# Patient Record
Sex: Female | Born: 1975 | Race: White | Hispanic: Yes | Marital: Married | Smoking: Never smoker
Health system: Southern US, Community
[De-identification: ages and names within clinical notes are randomized; demographics above are authoritative.]

## PROBLEM LIST (undated history)

## (undated) DIAGNOSIS — E079 Disorder of thyroid, unspecified: Secondary | ICD-10-CM

## (undated) DIAGNOSIS — E039 Hypothyroidism, unspecified: Secondary | ICD-10-CM

## (undated) DIAGNOSIS — Z8489 Family history of other specified conditions: Secondary | ICD-10-CM

## (undated) DIAGNOSIS — R112 Nausea with vomiting, unspecified: Secondary | ICD-10-CM

## (undated) DIAGNOSIS — Z9889 Other specified postprocedural states: Secondary | ICD-10-CM

## (undated) DIAGNOSIS — F419 Anxiety disorder, unspecified: Secondary | ICD-10-CM

## (undated) DIAGNOSIS — I10 Essential (primary) hypertension: Secondary | ICD-10-CM

## (undated) DIAGNOSIS — E785 Hyperlipidemia, unspecified: Secondary | ICD-10-CM

## (undated) DIAGNOSIS — D649 Anemia, unspecified: Secondary | ICD-10-CM

## (undated) DIAGNOSIS — U071 COVID-19: Secondary | ICD-10-CM

## (undated) HISTORY — DX: Hyperlipidemia, unspecified: E78.5

## (undated) HISTORY — DX: Disorder of thyroid, unspecified: E07.9

## (undated) HISTORY — PX: WISDOM TOOTH EXTRACTION: SHX21

## (undated) HISTORY — DX: Essential (primary) hypertension: I10

---

## 1999-07-24 ENCOUNTER — Other Ambulatory Visit: Admission: RE | Admit: 1999-07-24 | Discharge: 1999-07-24 | Payer: Self-pay | Admitting: Obstetrics & Gynecology

## 2000-01-15 ENCOUNTER — Encounter (INDEPENDENT_AMBULATORY_CARE_PROVIDER_SITE_OTHER): Payer: Self-pay

## 2000-01-15 ENCOUNTER — Inpatient Hospital Stay (HOSPITAL_COMMUNITY): Admission: AD | Admit: 2000-01-15 | Discharge: 2000-01-19 | Payer: Self-pay | Admitting: Obstetrics & Gynecology

## 2011-04-16 NOTE — Op Note (Signed)
[facility]  Patient:    Teresa Michael, Teresa Michael                         MRN: [id number] Proc. Date: 01/16/00 Adm. Date:  [id number] Attending:  Antionette Char                           Operative Report  PREOPERATIVE DIAGNOSIS:       Arrest of descent.  POSTOPERATIVE DIAGNOSIS:      Arrest of descent.  PROCEDURE:                    Primary low transverse cesarean section.  SURGEON:                      Coral Ceo.  ASSISTANT:                    Valetta Fuller.  ANESTHESIA:                   Epidural.  ESTIMATED BLOOD LOSS:         800 cc  IV FLUIDS:                    2200 cc.  URINE OUTPUT:                 450 cc, clear.  Foley to gravity.  COMPLICATIONS:                None.  FINDINGS:                     Viable female at 45, Apgars of 8 at one minute nd 9 at five minutes, weight of 7 pounds 4 ounces, cord pH of 7.34, normal uterus,  ovaries, and fallopian tubes.  DESCRIPTION OF PROCEDURE:     The patient is brought to the operating room after satisfactory redosing of the epidural.  The abdomen was prepped and draped in the usual sterile fashion.  A Pfannenstiel skin incision was made with the scalpel hat deepened down to the fascia with the scalpel.  The fascia was moved to the midline and, the fascial incision was extended to the left and to the right with curved  Mayo scissors.  Superior and inferior fascial edges were taken off of the rectus muscle both blunt and sharp dissection.  The rectus muscle was divided in the midline both bluntly and sharply.  Peritoneum was entered, and the incision was  extended to the left and to the right digitally.  Bladder blade was positioned, and the vesicouterine fold of peritoneum was grasps with forceps and was incised and undermined the Metzenbaum scissors.  The incision was extended to the left and o the right with the Metzenbaum scissors.  The bladder flap was  bluntly developed, and the bladder blade was repositioned in front of the urinary bladder and placed well out of the operative field.  Uterus was entered transversely in the lower uterine segment with the scalpel.  Vertex was noted to be occiput posterior, and delivery of the vertex was completed with rotation of the occiput into the incision and the aid of fundal pressure from the assistant.  The infants mouth and nose as suctioned with a suction bulb.  Clear fluid was expelled at the incision entrance into the uterus.  The delivery was then completed  with the aid of fundal pressure from the assistant.  The infants mouth and nose was suctioned with a suction bulb, and the umbilical cord was clamped and cut, and the infant was handed out to the nursing staff.  Cord pH and cord blood were obtained, and the placenta was spontaneously expelled from the uterine cavity intact.  The uterus was sterilized, and the endometrial surface was thoroughly debrided with a dry lap sponge.  The  edges of the uterine incision were grasped with ring forceps, and the uterus was closed with a continuous interlocking suture of 0 Monocryl in two layers.  The first layer was a continuous interlocking suture of 0 Monocryl, and the second layer of closure was a continuous imbricating suture of 0 Monocryl.  Hemostasis was excellent.  The uterus was placed back in its normal anatomic position, and pelvic cavity is thoroughly irrigated with warm saline solution, and all clots were removed.  The abdomen was then closed as follows:  The fascia was closed with a  continuous suture of 0 PDS.  The subcutaneous was thoroughly irrigated with warm saline solutions, and all areas of subcutaneous bleeding were coagulated with the Bovie.  The skin was then approximated with surgical stainless steel staples.  sterile bandage was applied to the incision closure.  Surgical technician indicated that all needle,  sponge, and instrument counts were correct.  The patient was transported to the recovery room in satisfactory condition after tolerating the  procedure well. DD:  01/16/00 TD:  01/17/00 Job: [id number]

## 2011-04-16 NOTE — Discharge Summary (Signed)
[facility]  Patient:    Teresa Michael, Teresa Michael                         MRN: [id number] Adm. Date:  [id number] Disc. Date: [id number] Attending:  Antionette Char Dictator:   Reynold Bowen, M.D.                           Discharge Summary  CONSULTING PHYSICIANS:        None.  PROCEDURE:                    Low transverse cesarean section for failure of descent.  HISTORY OF PRESENT ILLNESS:   The patient is a 35 year old, gravida 1, para 0, ho was admitted at 38-5/7 weeks for spontaneous rupture of membranes and irregular  contractions.  The patient was found to be in early active labor.  HOSPITAL COURSE:              The patient was noted to have irregular uterine contractions without changing her cervix.  The patient was started on low dose Pitocin for labor augmentation.  The patient had IUPC placed to verify the strength of her uterine contractions.  She was found to be in inadequate labor initially. Her Pitocin was increased.  Her uterine contractions were adequate.  The patient developed a fever secondary to prolonged rupture of membranes.  She was started on ampicillin for chorioamnionitis.  The patient began pushing with her uterine contractions and progressed to complete dilatation, 100% effacement, and +1 to 2 station.  However, the patient was not able to cause further descent of her fetus after two hours of pushing.  The patient was taken to the operating room by Leonette Most A. Clearance Coots, M.D. for low transverse cesarean section.  She was found to have a viable female that was delivered by cesarean section at 10 on January 16, 2000. Apgars were 8 and 9 at one and five minutes, respectively.  Birth weight was 7 pounds 4 ounces.  Cord pH was 7.34.  Postpartum, the patient was continued on Unasyn for 48 hours.  On the day of discharge, she had been afebrile for 48 hours and she had been off her IV antibiotics for one day.  The patient  was requesting Micronor for birth control.  She was breastfeeding her infant.  She was found to have significant anemia postoperatively with a hemoglobin of 6.1 and hematocrit of 18.9.  She was started on ferrous sulfate and Colace.  The patient had stable vital signs and was ready to go home.  DISPOSITION:                  The patient will be discharged to home on January 19, 2000.  She has an appointment to see her physician at [facility] on March 29, at 10 a.m.  DISCHARGE MEDICATIONS:        1. Lortab 5/500 one or two tablets every six hours                                  p.r.n. pain.                               2. Micronor birth control pills to be started this  Sunday and to be taken one tablet daily.                               3. Ferrous sulfate 325 mg tablets b.i.d. for one                                  month.                               4. Colace b.i.d. for one month.                               5. Prenatal vitamins until finished with her                                  current supply.  DISCHARGE DIAGNOSES:          1. Intrauterine pregnancy at 38-5/7 weeks, delivered.                               2. Failure of descent with need for low transverse                                  cesarean section.                               3. Viable term female.                               4. Premature rupture of membranes with                                  chorioamnionitis.                               5. Postoperative and postpartum anemia. DD:  01/19/00 TD:  01/19/00 Job: [id number]

## 2011-04-29 ENCOUNTER — Encounter (HOSPITAL_COMMUNITY): Payer: Self-pay | Attending: Obstetrics & Gynecology

## 2011-04-29 ENCOUNTER — Other Ambulatory Visit: Payer: Self-pay | Admitting: Obstetrics & Gynecology

## 2011-04-29 DIAGNOSIS — Z01818 Encounter for other preprocedural examination: Secondary | ICD-10-CM | POA: Insufficient documentation

## 2011-04-29 LAB — BASIC METABOLIC PANEL
Chloride: 104 mEq/L (ref 96–112)
GFR calc non Af Amer: >60 mL/min (ref >60)
Glucose, Bld: 75 mg/dL (ref 70–99)
Potassium: 3.8 mEq/L (ref 3.5–5.1)
Sodium: 135 mEq/L (ref 135–145)

## 2011-04-29 LAB — CBC
HCT: 36.7 % (ref 36.0–46.0)
Platelets: 200 10*3/uL (ref 150–400)
RBC: 4.86 MIL/uL (ref 3.87–5.11)
RDW: 16.3 % — ABNORMAL HIGH (ref 11.5–15.5)
WBC: 8.1 10*3/uL (ref 4.0–10.5)

## 2011-04-29 LAB — RPR: RPR Ser Ql: NONREACTIVE

## 2011-04-29 LAB — SURGICAL PCR SCREEN: Staphylococcus aureus: NEGATIVE

## 2011-05-03 ENCOUNTER — Other Ambulatory Visit: Payer: Self-pay | Admitting: Obstetrics & Gynecology

## 2011-05-03 ENCOUNTER — Inpatient Hospital Stay (HOSPITAL_COMMUNITY)
Admission: EM | Admit: 2011-05-03 | Discharge: 2011-05-06 | DRG: 766 | Disposition: A | Discharge status: Home / Self Care | Payer: Medicaid Other | Source: Ambulatory Visit | Attending: Obstetrics & Gynecology | Admitting: Obstetrics & Gynecology

## 2011-05-03 DIAGNOSIS — O34219 Maternal care for unspecified type scar from previous cesarean delivery: Principal | ICD-10-CM | POA: Diagnosis present

## 2011-05-03 DIAGNOSIS — O99814 Abnormal glucose complicating childbirth: Secondary | ICD-10-CM | POA: Diagnosis present

## 2011-05-03 DIAGNOSIS — O09529 Supervision of elderly multigravida, unspecified trimester: Secondary | ICD-10-CM | POA: Diagnosis present

## 2011-05-03 LAB — TYPE AND SCREEN
ABO/RH(D): O POS
Antibody Screen: NEGATIVE

## 2011-05-04 LAB — CBC
HCT: 29.3 % — ABNORMAL LOW (ref 36.0–46.0)
Hemoglobin: 8.9 g/dL — ABNORMAL LOW (ref 12.0–15.0)
WBC: 10.4 10*3/uL (ref 4.0–10.5)

## 2011-05-09 ENCOUNTER — Inpatient Hospital Stay (HOSPITAL_COMMUNITY): Admission: AD | Admit: 2011-05-09 | Payer: Self-pay | Source: Home / Self Care | Admitting: Obstetrics & Gynecology

## 2011-05-10 LAB — GLUCOSE, CAPILLARY

## 2011-06-03 NOTE — Discharge Summary (Signed)
  NAMEDULSE, RUTAN                ACCOUNT NO.:  [id number]  MEDICAL RECORD NO.:  [id number]  LOCATION:  9103                          FACILITY:  [facility]  PHYSICIAN:  Roseanna Rainbow, M.D.DATE OF BIRTH:  [birth year]  DATE OF ADMISSION:  05/03/2011 DATE OF DISCHARGE:  05/06/2011                              DISCHARGE SUMMARY   CHIEF COMPLAINT:  The patient is a 35 year old para 1 with an EDC of May 09, 2011, with an intrauterine pregnancy of 39 plus weeks for an elective repeat cesarean delivery.  ALLERGIES:  No known drug allergies.  MEDICATIONS:  DiaBeta and prenatal vitamins.  PAST OB HISTORY:  In 2001, she delivered of a live born female, full term, birth weight 7 pounds 6 ounces via cesarean delivery.  PAST MEDICAL HISTORY:  She denies.  PAST GYN HISTORY:  She denies.  FAMILY HISTORY:  Remarkable for chronic alcoholism, depression, hypertension, hypercholesterolemia.  SOCIAL HISTORY:  She is a smoker, married with minimal alcohol use.  No tobacco.  PAST SURGICAL HISTORY:  Please see the above oral surgery.  OB RISK FACTORS:  Gestational diabetes and no event rate controlled with history of repeat previous cesarean delivery.  PHYSICAL EXAMINATION:  VITAL SIGNS:  Blood pressure is 123/74. GENERAL:  No apparent distress. HEAD, EYES, EARS, NOSE, AND THROAT:  Normocephalic, atraumatic. NECK:  Supple. LUNGS:  Clear to auscultation bilaterally. HEART:  Regular rate and rhythm. ABDOMEN:  Gravid. PELVIC:  Deferred. EXTREMITIES:  Trace edema.  PRENATAL LABS:  A 2-hour GTT abnormal, GBS negative.  GC negative. Hepatitis B surface antigen negative.  Hemoglobin 10.9, hematocrit 35.5, platelets 251,000.  HIV nonreactive.  Pap negative.  Blood type O positive.  Antibody screen negative.  Rubella immune. Sickle cell negative.  Urine culture and sensitivity, no growth.  RPR nonreactive. Ultrasound on May 29, 2011, 21 weeks 5 days.  [facility] May 02, 2011,  no previa.  ASSESSMENT:  Primipara at 39 plus weeks, gestational diabetes on an oral agent, AGA fetus,  history of previous cesarean delivery, declined TOLAC.  PLAN:  Admission repeat cesarean delivery.  The risks, benefits were reviewed with the patient's questions were answered to the patient's stated satisfaction.  HOSPITAL COURSE:  The patient was admitted and underwent a repeat cesarean delivery, please see the dictated operative summary for findings.  On postoperative day #1, hemoglobin was 8.9.  The patient was hemodynamically stable.  The remainder of the patient's hospital course was uneventful.  She was discharged to home on postoperative day #3.  DISCHARGE DIAGNOSES: 1. Intrauterine pregnancy at term. 2. Gestational diabetes, on oral agent. 3. Anemia, iron deficiency.  PROCEDURE:  Repeat cesarean delivery.  CONDITION:  Stable.  DIET:  Regular.  ACTIVITY:  Pelvic rest, progressive activity.  MEDICATIONS:  Percocet 1-2 tablets every 6 hours as needed.  DISPOSITION:  The patient is to follow up in the office in 3 weeks.     Roseanna Rainbow, M.D.     [name]  D:  05/24/2011  T:  05/24/2011  Job:  [id number]  Electronically Signed by Antionette Char M.D. on 06/03/2011 10:27:13 PM

## 2011-06-04 NOTE — Discharge Summary (Signed)
Teresa Michael, SUNDQUIST NO.:  [id number]  MEDICAL RECORD NO.:  [id number]  LOCATION:  9103                          FACILITY:  [facility]  PHYSICIAN:  Roseanna Rainbow, M.D.DATE OF BIRTH:  [birth year]  DATE OF ADMISSION:  05/03/2011 DATE OF DISCHARGE:  05/06/2011                              DISCHARGE SUMMARY   CHIEF COMPLAINT:  The patient is a 35 year old para 1 with an estimated date of confinement of June 10 with an intrauterine pregnancy at 24 plus weeks who presents for an elective repeat cesarean delivery.  HISTORY OF PRESENT ILLNESS:  Please see the above.  ALLERGIES:  No known drug allergies.  MEDICATION:  Diabeta, prenatal vitamins.  PAST OB HISTORY:  In February 2001, she was delivered at term 7 pounds 6 ounce female via cesarean delivery.  PAST MEDICAL HISTORY:  She denies.  PAST GYN HISTORY:  She denies.  FAMILY HISTORY:  Remarkable for chronic alcoholism, depression, hypertension, and hypercholesterolemia.  PAST SURGICAL HISTORY:  Please see the above oral surgery.  SOCIAL HISTORY:  She is a Futures trader, married.  Formally minimal alcohol user.  She denies any tobacco or substance abuse.  OB RISK FACTORS:  Gestational diabetes on oral agent, advanced maternal age, history of previous cesarean delivery.  PRENATAL LABS:  A 2-hour GTT abnormal, GBS negative, GC negative, hepatitis B surface antigen negative, hemoglobin 10.9, hematocrit 35.5, platelets 251,000, HIV nonreactive.  Pap smear negative.  Blood type O+, antibody screen negative, rubella immune, sickle cell negative.  Urine culture and sensitivity, no growth.  RPR nonreactive.  Ultrasound on June 3 at 21 weeks 5 days, [facility] June 3, no previa.  PHYSICAL EXAMINATION:  VITAL SIGNS:  Stable, afebrile, blood pressure 123/74. GENERAL APPEARANCE:  A well developed, well nourished, no apparent distress. NECK:  Supple. LUNGS:  Clear to auscultation bilaterally. HEART:  Regular rate and  rhythm. ABDOMEN:  Gravid. PELVIC:  Deferred. EXTREMITIES:  Trace lower extremity edema, nontender.  ASSESSMENT:  Primipara at 39 plus weeks, gestational diabetes, oral agent, AGA fetus, history of previous cesarean delivery, declines trial of labor after cesarean.  PLAN:  Admission, repeat cesarean delivery.  Risks, benefits were reviewed with the patient of this management plan.  Questions were answered and is stated satisfactory.  HOSPITAL COURSE:  The patient was admitted.  She underwent a repeat cesarean delivery.  Please see the dictated operative summary.  On postoperative day #1, hemoglobin was to be 49.  The patient is hemodynamically stable.  Remainder of her hospital course is uneventful and she was discharged to home on postoperative day #3.  DISCHARGE DIAGNOSES:  Intrauterine pregnancy at 1 plus weeks, pregnancy complicated by gestational diabetes on an oral agent, history of previous cesarean delivery, declines trial of labor after cesarean. Anemia secondary to blood loss.  PROCEDURE:  Repeat cesarean delivery.  CONDITION:  Stable.  DIET:  Regular.  ACTIVITY:  Pelvic rest, progressive activity.  MEDICATIONS: 1. Percocet 5/325, 1-2 tablets every 6 hours as needed. 2. Over-the-counter ibuprofen as needed. 3. Continue prenatal vitamins. 4. Add over-the-counter ferrous sulfate 325 mg 1 tablet daily. 5. Over-the-counter stool softener as needed.  DISPOSITION:  The patient is to follow up  in the office in 2 weeks.     Roseanna Rainbow, M.D.     Teresa Michael  D:  06/03/2011  T:  06/04/2011  Job:  [id number]  Electronically Signed by Antionette Char M.D. on 06/04/2011 03:38:11 PM

## 2011-06-15 NOTE — Op Note (Signed)
NAME:  Teresa Michael, Teresa Michael NO.:  [id number]  MEDICAL RECORD NO.:  [id number]  LOCATION:                                FACILITY:  [facility]  PHYSICIAN:  Roseanna Rainbow, M.D. DATE OF BIRTH:  DATE OF PROCEDURE: DATE OF DISCHARGE:                              OPERATIVE REPORT   PREOPERATIVE DIAGNOSES: 1. Intrauterine pregnancy at 39 plus weeks. 2. Gestational diabetes, on oral agents. 3. History of a previous cesarean delivery. 4. Declines a TOLAC.  POSTOPERATIVE DIAGNOSES: 1. Intrauterine pregnancy at 39 plus weeks. 2. Gestational diabetes, on oral agents. 3. History of a previous cesarean delivery. 4. Declines a TOLAC.  PROCEDURE:  Repeat low uterine flap elliptical cesarean delivery via transverse incision.  SURGEON:  Roseanna Rainbow, MD  ANESTHESIA:  Spinal.  ESTIMATED BLOOD LOSS:  600 mL.  COMPLICATIONS:  None.  IV FLUIDS AND URINE OUTPUT:  As per Anesthesiology.  FINDINGS:  Live born female, cephalic presentation and fairly dense adhesions involving the omentum to the parietal peritoneum of the inferior abdominal wall.  PROCEDURE:  The patient was taken to the operating room with an IV running.  Spinal anesthetic was administered.  She was then placed in the dorsal supine position with a leftward tilt and prepped and draped in the usual sterile fashion.  After time-out had been completed.  The previous scar was then incised with a scalpel.  This incision was then extended down to the fascia with the Bovie.  The fascia was nicked in the midline.  The fascial incision was then extended bilaterally.  The superior aspect of the fascial incision was tented up and the underlying rectus muscles dissected off.  The inferior aspect of the fascial incision was manipulated in a similar fashion.  The rectus muscles were separated in the midline.  Parietal peritoneum was tented up and entered sharply.  This incision was then extended superiorly and  inferiorly with good visualization of the bladder.  The above-noted omental adhesions were divided with the Bovie.  The bladder blade was then placed.  The vesicouterine peritoneum was tented up and entered.  This incision was then extended bilaterally.  The bladder flap was then created using sharp dissection.  The bladder blade was then replaced.  The lower uterine segment was incised in a transverse fashion with a scalpel. This incision was then extended bluntly.  The infant's head was delivered atraumatically.  The cord was clamped and cut.  The infant was handed off to the awaiting neonatologist.  The placenta was then removed.  The intrauterine cavity was evacuated of any remaining amniotic fluid clots and debris with a moistened laparotomy sponge. Uterine incision was then reapproximated in a running interlocking fashion using a suture of O Monocryl.  A second imbricating suture of the same was then placed.  Adequate hemostasis was noted.  The paracolic gutters were then irrigated.  The parietal peritoneum was reapproximated in a running fashion using a suture of 2-0 Vicryl.  Fascia was reapproximated with 2 running sutures of 0 PDS tied in the midline.  The skin was closed in a subcuticular fashion using a suture of 3-0 Monocryl.  At the closure of  the procedure, the instrument and pack counts were said to be correct x2.  The patient was taken to the PACU awake and in stable condition.     Roseanna Rainbow, M.D.     Teresa Michael  D:  05/04/2011  T:  05/04/2011  Job:  [id number]  Electronically Signed by Antionette Char M.D. on 06/03/2011 10:18:30 PM

## 2011-11-20 ENCOUNTER — Ambulatory Visit: Payer: Self-pay

## 2011-11-20 DIAGNOSIS — R509 Fever, unspecified: Secondary | ICD-10-CM

## 2011-11-20 DIAGNOSIS — J029 Acute pharyngitis, unspecified: Secondary | ICD-10-CM

## 2012-01-08 ENCOUNTER — Ambulatory Visit: Payer: Self-pay | Admitting: Family Medicine

## 2012-01-08 VITALS — BP 128/81 | HR 82 | Temp 98.0°F | Resp 16 | Ht 59.5 in | Wt 121.0 lb

## 2012-01-08 DIAGNOSIS — J329 Chronic sinusitis, unspecified: Secondary | ICD-10-CM

## 2012-01-08 DIAGNOSIS — J0100 Acute maxillary sinusitis, unspecified: Secondary | ICD-10-CM

## 2012-01-08 MED ORDER — AMOXICILLIN 875 MG PO TABS: 875.0000 mg | ORAL_TABLET | Freq: Two times a day (BID) | ORAL | Status: AC

## 2012-01-08 MED ORDER — FLUTICASONE PROPIONATE 50 MCG/ACT NA SUSP: 2.0000 | Freq: Every day | NASAL | Status: DC

## 2012-01-08 NOTE — Progress Notes (Signed)
  Subjective:    Patient ID: Teresa Michael, female    DOB: 12-21-1975, 36 y.o.   MRN: [id number]  HPI Thursday developed ST, headache and runny nose. Now with maxillary facial tenderness and otalgia.  Son sick   Review of Systems  Constitutional: Negative for fever and chills.  HENT: Positive for rhinorrhea (yellow nasal discharge).   Respiratory: Positive for cough (occasional).   Neurological: Positive for headaches.       Objective:   Physical Exam  Constitutional: She appears well-developed and well-nourished.  HENT:  Head: Normocephalic and atraumatic.  Right Ear: Tympanic membrane is retracted.  Left Ear: Tympanic membrane is retracted.  Nose: Rhinorrhea present.  Mouth/Throat: Posterior oropharyngeal edema: post nasal drainage.  Neck: Neck supple.  Cardiovascular: Normal rate, regular rhythm and normal heart sounds.   Pulmonary/Chest: Effort normal and breath sounds normal.  Lymphadenopathy:    She has cervical adenopathy (shoddy anterior ).  Neurological: She is alert.  Skin: Skin is warm.          Assessment & Plan:   1. Sinusitis  amoxicillin (AMOXIL) 875 MG tablet, fluticasone (FLONASE) 50 MCG/ACT nasal spray   Anticipatory guidance

## 2012-01-09 ENCOUNTER — Encounter: Payer: Self-pay | Admitting: Family Medicine

## 2012-09-30 ENCOUNTER — Ambulatory Visit: Payer: Self-pay | Admitting: Family Medicine

## 2012-09-30 VITALS — BP 135/78 | HR 77 | Temp 98.5°F | Resp 18 | Wt 117.0 lb

## 2012-09-30 DIAGNOSIS — K644 Residual hemorrhoidal skin tags: Secondary | ICD-10-CM

## 2012-09-30 DIAGNOSIS — R5381 Other malaise: Secondary | ICD-10-CM

## 2012-09-30 DIAGNOSIS — R42 Dizziness and giddiness: Secondary | ICD-10-CM

## 2012-09-30 DIAGNOSIS — R002 Palpitations: Secondary | ICD-10-CM

## 2012-09-30 DIAGNOSIS — R5383 Other fatigue: Secondary | ICD-10-CM

## 2012-09-30 LAB — POCT CBC
Lymph, poc: 1.9 (ref 0.6–3.4)
MCH, POC: 21 pg — AB (ref 27–31.2)
MCHC: 29.6 g/dL — AB (ref 31.8–35.4)
MCV: 70.9 fL — AB (ref 80–97)
MID (cbc): 0.6 (ref 0–0.9)
POC LYMPH PERCENT: 29.6 %L (ref 10–50)
Platelet Count, POC: 270 10*3/uL (ref 142–424)
RDW, POC: 16.8 %
WBC: 6.4 10*3/uL (ref 4.6–10.2)

## 2012-09-30 MED ORDER — HYDROCORTISONE 2.5 % RE CREA: TOPICAL_CREAM | Freq: Two times a day (BID) | RECTAL | Status: DC

## 2012-09-30 NOTE — Progress Notes (Signed)
Subjective:    Patient ID: Teresa Michael, female    DOB: Feb 19, 1976, 36 y.o.   MRN: [id number]  HPI Teresa Michael is a 36 y.o. female  Dizzy feeling since few weeks ago - shaky and weak at times - past few weeks - comes and goes.  No fever, no N/V, no abd pain.  No known hx of anemia. No dark or tarry stools. No chest pain, but palpitations at times - few times last week with stress at work.   Feels more nervous when shaky or weak.  Eating and drinking ok.  2-3 cups of coffee per day. Constipated last week. Sore at anus with BM's feels swollen in this area, but no bleeding.  Constipation improved 2 pr 3 days ago - moving bowels better. Still sore in area with BM's.     Gestational DM when pregnant about 16 months ago.. No recent blood work.  Urinating normally.  Sometimes more thirsty. No vision changes.   Review of Systems  Constitutional: Positive for fatigue. Negative for fever and chills.  Respiratory: Negative for chest tightness and shortness of breath.   Cardiovascular: Positive for palpitations. Negative for chest pain.  Neurological: Positive for dizziness. Negative for speech difficulty and weakness.  Psychiatric/Behavioral: The patient is nervous/anxious (occasional when fatigue spell. ).        Objective:   Physical Exam  Constitutional: She is oriented to person, place, and time. She appears well-developed and well-nourished.  HENT:  Head: Normocephalic and atraumatic.  Mouth/Throat: Oropharynx is clear and moist. No oropharyngeal exudate.  Eyes: Conjunctivae normal and EOM are normal. Pupils are equal, round, and reactive to light.  Neck: Carotid bruit is not present.  Cardiovascular: Normal rate, regular rhythm, normal heart sounds and intact distal pulses.   Pulmonary/Chest: Effort normal and breath sounds normal.  Abdominal: Soft. She exhibits no pulsatile midline mass. There is no tenderness.  Genitourinary:     Neurological: She is alert and oriented to person,  place, and time. She has normal strength. No sensory deficit.       Nonfocal.   Skin: Skin is warm and dry.  Psychiatric: She has a normal mood and affect. Her behavior is normal.   Results for orders placed in visit on 09/30/12  POCT CBC      Component Value Range   WBC 6.4  4.6 - 10.2 K/uL   Lymph, poc 1.9  0.6 - 3.4   POC LYMPH PERCENT 29.6  10 - 50 %L   MID (cbc) 0.6  0 - 0.9   POC MID % 9.8  0 - 12 %M   POC Granulocyte 3.9  2 - 6.9   Granulocyte percent 60.6  37 - 80 %G   RBC 6.00 (*) 4.04 - 5.48 M/uL   Hemoglobin 12.6  12.2 - 16.2 g/dL   HCT, POC 08.6  57.8 - 47.9 %   MCV 70.9 (*) 80 - 97 fL   MCH, POC 21.0 (*) 27 - 31.2 pg   MCHC 29.6 (*) 31.8 - 35.4 g/dL   RDW, POC 46.9     Platelet Count, POC 270  142 - 424 K/uL   MPV 9.7  0 - 99.8 fL  GLUCOSE, POCT (MANUAL RESULT ENTRY)      Component Value Range   POC Glucose 96  70 - 99 mg/dl          Assessment & Plan:  [name] is a 36 y.o. female 1. Dizziness  POCT CBC, POCT  glucose (manual entry), Basic metabolic panel, TSH  2. Fatigue  POCT CBC, POCT glucose (manual entry), Basic metabolic panel, TSH  3. Heart palpitations  POCT CBC, POCT glucose (manual entry), Basic metabolic panel, TSH  4. External hemorrhoid  hydrocortisone (ANUSOL-HC) 2.5 % rectal cream    Episodic malaise/fatigue, dizziness.  Suspect psychogenic/stress component and with intermittent palpitations - caffeine contributor. Reassuring CBC and glucose. Discussed less caffeine, more water, check TSH, lytes, stress mgt/relaxation techniques and rtc precautions.  Ext hemorrhoid - non thrombosed at this point. rtc precautions  With regards to s/sx of thrombosed hemorrhoid - recheck if not improving with anusol HC , and again encouraged increased water, fiber, and colace if needed.  Patient Instructions  Your should receive a call or letter about your lab results within the next week to 10 days. Increase your water and fiber intake.  Recheck in the next  week if not improving - Return to the clinic or go to the nearest emergency room if any of your symptoms worsen or new symptoms occur. Hemorrhoids Hemorrhoids are enlarged (dilated) veins around the rectum. There are 2 types of hemorrhoids, and the type of hemorrhoid is determined by its location. Internal hemorrhoids occur in the veins just inside the rectum.They are usually not painful, but they may bleed.However, they may poke through to the outside and become irritated and painful. External hemorrhoids involve the veins outside the anus and can be felt as a painful swelling or hard lump near the anus.They are often itchy and may crack and bleed. Sometimes clots will form in the veins. This makes them swollen and painful. These are called thrombosed hemorrhoids. CAUSES Causes of hemorrhoids include:  Pregnancy. This increases the pressure in the hemorrhoidal veins.  Constipation.  Straining to have a bowel movement.  Obesity.  Heavy lifting or other activity that caused you to strain. TREATMENT Most of the time hemorrhoids improve in 1 to 2 weeks. However, if symptoms do not seem to be getting better or if you have a lot of rectal bleeding, your caregiver may perform a procedure to help make the hemorrhoids get smaller or remove them completely.Possible treatments include:  Rubber band ligation. A rubber band is placed at the base of the hemorrhoid to cut off the circulation.  Sclerotherapy. A chemical is injected to shrink the hemorrhoid.  Infrared light therapy. Tools are used to burn the hemorrhoid.  Hemorrhoidectomy. This is surgical removal of the hemorrhoid. HOME CARE INSTRUCTIONS   Increase fiber in your diet. Ask your caregiver about using fiber supplements.  Drink enough water and fluids to keep your urine clear or pale yellow.  Exercise regularly.  Go to the bathroom when you have the urge to have a bowel movement. Do not wait.  Avoid straining to have bowel  movements.  Keep the anal area dry and clean.  Only take over-the-counter or prescription medicines for pain, discomfort, or fever as directed by your caregiver. If your hemorrhoids are thrombosed:  Take warm sitz baths for 20 to 30 minutes, 3 to 4 times per day.  If the hemorrhoids are very tender and swollen, place ice packs on the area as tolerated. Using ice packs between sitz baths may be helpful. Fill a plastic bag with ice. Place a towel between the bag of ice and your skin.  Medicated creams and suppositories may be used or applied as directed.  Do not use a donut-shaped pillow or sit on the toilet for long periods. This increases blood pooling  and pain. SEEK MEDICAL CARE IF:   You have increasing pain and swelling that is not controlled with your medicine.  You have uncontrolled bleeding.  You have difficulty or you are unable to have a bowel movement.  You have pain or inflammation outside the area of the hemorrhoids.  You have chills or an oral temperature above 102 F (38.9 C). MAKE SURE YOU:   Understand these instructions.  Will watch your condition.  Will get help right away if you are not doing well or get worse. Document Released: 11/12/2000 Document Revised: 02/07/2012 Document Reviewed: 10/26/2010 [facility] Patient Information 2013 [place], [place].

## 2012-09-30 NOTE — Patient Instructions (Signed)
Your should receive a call or letter about your lab results within the next week to 10 days. Increase your water and fiber intake.  Recheck in the next week if not improving - Return to the clinic or go to the nearest emergency room if any of your symptoms worsen or new symptoms occur. Hemorrhoids Hemorrhoids are enlarged (dilated) veins around the rectum. There are 2 types of hemorrhoids, and the type of hemorrhoid is determined by its location. Internal hemorrhoids occur in the veins just inside the rectum.They are usually not painful, but they may bleed.However, they may poke through to the outside and become irritated and painful. External hemorrhoids involve the veins outside the anus and can be felt as a painful swelling or hard lump near the anus.They are often itchy and may crack and bleed. Sometimes clots will form in the veins. This makes them swollen and painful. These are called thrombosed hemorrhoids. CAUSES Causes of hemorrhoids include:  Pregnancy. This increases the pressure in the hemorrhoidal veins.  Constipation.  Straining to have a bowel movement.  Obesity.  Heavy lifting or other activity that caused you to strain. TREATMENT Most of the time hemorrhoids improve in 1 to 2 weeks. However, if symptoms do not seem to be getting better or if you have a lot of rectal bleeding, your caregiver may perform a procedure to help make the hemorrhoids get smaller or remove them completely.Possible treatments include:  Rubber band ligation. A rubber band is placed at the base of the hemorrhoid to cut off the circulation.  Sclerotherapy. A chemical is injected to shrink the hemorrhoid.  Infrared light therapy. Tools are used to burn the hemorrhoid.  Hemorrhoidectomy. This is surgical removal of the hemorrhoid. HOME CARE INSTRUCTIONS   Increase fiber in your diet. Ask your caregiver about using fiber supplements.  Drink enough water and fluids to keep your urine clear or pale  yellow.  Exercise regularly.  Go to the bathroom when you have the urge to have a bowel movement. Do not wait.  Avoid straining to have bowel movements.  Keep the anal area dry and clean.  Only take over-the-counter or prescription medicines for pain, discomfort, or fever as directed by your caregiver. If your hemorrhoids are thrombosed:  Take warm sitz baths for 20 to 30 minutes, 3 to 4 times per day.  If the hemorrhoids are very tender and swollen, place ice packs on the area as tolerated. Using ice packs between sitz baths may be helpful. Fill a plastic bag with ice. Place a towel between the bag of ice and your skin.  Medicated creams and suppositories may be used or applied as directed.  Do not use a donut-shaped pillow or sit on the toilet for long periods. This increases blood pooling and pain. SEEK MEDICAL CARE IF:   You have increasing pain and swelling that is not controlled with your medicine.  You have uncontrolled bleeding.  You have difficulty or you are unable to have a bowel movement.  You have pain or inflammation outside the area of the hemorrhoids.  You have chills or an oral temperature above 102 F (38.9 C). MAKE SURE YOU:   Understand these instructions.  Will watch your condition.  Will get help right away if you are not doing well or get worse. Document Released: 11/12/2000 Document Revised: 02/07/2012 Document Reviewed: 10/26/2010 [facility] Patient Information 2013 [place], [place].

## 2012-10-01 LAB — BASIC METABOLIC PANEL
BUN: 14 mg/dL (ref 6–23)
Glucose, Bld: 93 mg/dL (ref 70–99)
Potassium: 4.3 mEq/L (ref 3.5–5.3)

## 2012-10-19 ENCOUNTER — Ambulatory Visit: Payer: Self-pay | Admitting: Family Medicine

## 2012-10-19 VITALS — BP 139/80 | HR 92 | Temp 98.3°F | Resp 16 | Ht 59.5 in | Wt 116.0 lb

## 2012-10-19 DIAGNOSIS — E0590 Thyrotoxicosis, unspecified without thyrotoxic crisis or storm: Secondary | ICD-10-CM

## 2012-10-19 MED ORDER — METOPROLOL TARTRATE 25 MG PO TABS: 25.0000 mg | ORAL_TABLET | Freq: Two times a day (BID) | ORAL | Status: DC

## 2012-10-19 NOTE — Patient Instructions (Signed)
We will refer you to an endocrinologist for further evaluation of your thyroid. You can take the metoprolol twice per day if needed for the symptoms from the hyperactive thyroid.  Return to the clinic or go to the nearest emergency room if any of your symptoms worsen or new symptoms occur.

## 2012-10-19 NOTE — Progress Notes (Signed)
Subjective:    Patient ID: Teresa Michael, female    DOB: November 07, 1976, 36 y.o.   MRN: [id number]  HPI Teresa Michael is a 36 y.o. female  See 09/30/12 office visit.   Episodic malaise/fatigue, dizziness.  Suspect psychogenic/stress component and with intermittent palpitations - caffeine contributor. Reassuring CBC and glucose. Discussed less caffeine, more water,  stress mgt/relaxation techniques and rtc precautions.  TSH was low on labs. Here for follow up. Results for orders placed in visit on 09/30/12  POCT CBC      Component Value Range   WBC 6.4  4.6 - 10.2 K/uL   Lymph, poc 1.9  0.6 - 3.4   POC LYMPH PERCENT 29.6  10 - 50 %L   MID (cbc) 0.6  0 - 0.9   POC MID % 9.8  0 - 12 %M   POC Granulocyte 3.9  2 - 6.9   Granulocyte percent 60.6  37 - 80 %G   RBC 6.00 (*) 4.04 - 5.48 M/uL   Hemoglobin 12.6  12.2 - 16.2 g/dL   HCT, POC 82.9  56.2 - 47.9 %   MCV 70.9 (*) 80 - 97 fL   MCH, POC 21.0 (*) 27 - 31.2 pg   MCHC 29.6 (*) 31.8 - 35.4 g/dL   RDW, POC 13.0     Platelet Count, POC 270  142 - 424 K/uL   MPV 9.7  0 - 99.8 fL  GLUCOSE, POCT (MANUAL RESULT ENTRY)      Component Value Range   POC Glucose 96  70 - 99 mg/dl  BASIC METABOLIC PANEL      Component Value Range   Sodium 137  135 - 145 mEq/L   Potassium 4.3  3.5 - 5.3 mEq/L   Chloride 105  96 - 112 mEq/L   CO2 23  19 - 32 mEq/L   Glucose, Bld 93  70 - 99 mg/dL   BUN 14  6 - 23 mg/dL   Creat 8.65  7.84 - 6.96 mg/dL   Calcium 8.9  8.4 - 29.5 mg/dL  TSH      Component Value Range   TSH <0.008 (*) 0.350 - 4.500 uIU/mL   Occasionally jitteryness. Improved palpitations since cutting back on coffee. Shaky at times, but less than before. No cp, no sob.  Occasional headache - twice per week. Lost weight with eating healthier 1 month ago. Lost 3-4 pounds only. Dry skin, no hair changes. No visual change. No trouble swallowing. LMP 10/09/12. Normal menses 1 month.   Review of Systems As above.     Objective:   Physical Exam    Constitutional: She is oriented to person, place, and time. She appears well-developed and well-nourished.  HENT:  Head: Normocephalic and atraumatic.  Eyes: Conjunctivae normal and EOM are normal. Pupils are equal, round, and reactive to light.  Neck: Carotid bruit is not present. Thyromegaly (diffuse fullness  - no palpable nodule. ) present.  Cardiovascular: Normal rate, regular rhythm, normal heart sounds and intact distal pulses.   Pulmonary/Chest: Effort normal and breath sounds normal. She has no rales.  Abdominal: Soft. She exhibits no pulsatile midline mass. There is no tenderness.  Neurological: She is alert and oriented to person, place, and time.  Skin: Skin is warm and dry.  Psychiatric: She has a normal mood and affect. Her behavior is normal.       Assessment & Plan:  Teresa Michael is a 36 y.o. female 1. Hyperthyroidism  Ambulatory referral to Endocrinology,  metoprolol tartrate (LOPRESSOR) 25 MG tablet   New diagnosis of hyperthyroidism - will start betablocker for symptom relief - improving some off caffeine, SED and orthostatic precautions. Refer to endocrine.  rtc precautions.

## 2012-11-19 ENCOUNTER — Ambulatory Visit: Payer: Self-pay | Admitting: Family Medicine

## 2012-11-19 VITALS — BP 128/81 | HR 98 | Temp 97.8°F | Resp 16 | Ht 59.0 in | Wt 115.0 lb

## 2012-11-19 DIAGNOSIS — R059 Cough, unspecified: Secondary | ICD-10-CM

## 2012-11-19 DIAGNOSIS — R05 Cough: Secondary | ICD-10-CM

## 2012-11-19 DIAGNOSIS — J069 Acute upper respiratory infection, unspecified: Secondary | ICD-10-CM

## 2012-11-19 MED ORDER — AMOXICILLIN 875 MG PO TABS: 875.0000 mg | ORAL_TABLET | Freq: Two times a day (BID) | ORAL | Status: DC

## 2012-11-19 MED ORDER — BENZONATATE 100 MG PO CAPS: ORAL_CAPSULE | ORAL | Status: DC

## 2012-11-19 MED ORDER — HYDROCODONE-HOMATROPINE 5-1.5 MG/5ML PO SYRP: 5.0000 mL | ORAL_SOLUTION | ORAL | Status: DC | PRN

## 2012-11-19 NOTE — Progress Notes (Signed)
Subjective: Patient has had a bad cough for the past 3 days. She has had congestion. She's not been running any fever. Her 75-month-old son has same thing.  Objective: TMs normal. Throat clear. Neck supple without nodes thyromegaly. Chest clear. Heart regular without murmurs.  Assessment: Bronchitis and URI  Plan: Treat symptomatically with cough syrup. If not doing better in the next 2 days she can fill the amoxicillin . She is agreeable to that.

## 2012-11-19 NOTE — Patient Instructions (Signed)
Drink plenty of fluids  Takeoff medicines as directed  If not doing better in 2 days start the amoxicillin

## 2012-12-02 ENCOUNTER — Ambulatory Visit: Payer: Self-pay | Admitting: Physician Assistant

## 2012-12-02 VITALS — BP 152/74 | HR 103 | Temp 98.3°F | Resp 16 | Ht 59.0 in | Wt 112.0 lb

## 2012-12-02 DIAGNOSIS — R110 Nausea: Secondary | ICD-10-CM

## 2012-12-02 DIAGNOSIS — R51 Headache: Secondary | ICD-10-CM

## 2012-12-02 DIAGNOSIS — J029 Acute pharyngitis, unspecified: Secondary | ICD-10-CM

## 2012-12-02 MED ORDER — PROMETHAZINE HCL 25 MG PO TABS: 25.0000 mg | ORAL_TABLET | Freq: Three times a day (TID) | ORAL | Status: DC | PRN

## 2012-12-02 MED ORDER — AMOXICILLIN 875 MG PO TABS: 875.0000 mg | ORAL_TABLET | Freq: Two times a day (BID) | ORAL | Status: DC

## 2012-12-02 NOTE — Progress Notes (Signed)
  Subjective:    Patient ID: Teresa Michael, female    DOB: Nov 17, 1976, 37 y.o.   MRN: [id number]  HPI 37 year old female presents with 5 day history of cough, nasal congestion, and sore throat. States throat is extremely painful and she has pain with swallowing. Also admits to headache with nausea and several episodes of emesis. Does have history of migraines and states this feels like her typical headache. She did take some ibuprofen yesterday which helped. She has not taken anything today.  Denies vision changes, chest pain, dizziness, or lightheadedness.      Review of Systems  Constitutional: Negative for fever and chills.  HENT: Positive for congestion, sore throat and rhinorrhea. Negative for ear pain and neck pain.   Eyes: Negative for photophobia and visual disturbance.  Respiratory: Positive for cough. Negative for shortness of breath and wheezing.   Cardiovascular: Negative for chest pain.  Gastrointestinal: Positive for nausea. Negative for vomiting and abdominal pain.  Neurological: Positive for headaches. Negative for dizziness and light-headedness.  All other systems reviewed and are negative.       Objective:   Physical Exam  Constitutional: She is oriented to person, place, and time. She appears well-developed and well-nourished.  HENT:  Head: Normocephalic and atraumatic.  Right Ear: External ear normal.  Left Ear: External ear normal.  Mouth/Throat: Oropharynx is clear and moist. No oropharyngeal exudate (bilateral tonsillar erythema with 2+ tonsillar swelling).  Eyes: Conjunctivae normal are normal.  Neck: Normal range of motion.  Cardiovascular: Normal rate, regular rhythm and normal heart sounds.   Pulmonary/Chest: Effort normal and breath sounds normal.  Abdominal: Soft. Bowel sounds are normal. There is no tenderness. There is no rebound and no guarding.  Lymphadenopathy:    She has no cervical adenopathy.  Neurological: She is alert and oriented to person,  place, and time.  Psychiatric: She has a normal mood and affect. Her behavior is normal. Judgment and thought content normal.          Assessment & Plan:   1. Nausea  promethazine (PHENERGAN) 25 MG tablet  2. Acute pharyngitis    3. Headache     Will cover with amoxicillin 875 mg bid Likely typical migraine headache - phenergan as needed for nausea Ibuprofen tid prn headache Follow up or go to ER with any worsening symptoms

## 2013-01-01 ENCOUNTER — Other Ambulatory Visit (HOSPITAL_COMMUNITY): Payer: Self-pay | Admitting: Endocrinology

## 2013-01-01 DIAGNOSIS — E0590 Thyrotoxicosis, unspecified without thyrotoxic crisis or storm: Secondary | ICD-10-CM

## 2013-01-11 ENCOUNTER — Encounter (HOSPITAL_COMMUNITY): Payer: Self-pay

## 2013-01-12 ENCOUNTER — Encounter (HOSPITAL_COMMUNITY): Payer: Self-pay

## 2013-01-18 ENCOUNTER — Encounter (HOSPITAL_COMMUNITY)
Admission: RE | Admit: 2013-01-18 | Discharge: 2013-01-18 | Disposition: A | Discharge status: Home / Self Care | Payer: Self-pay | Source: Ambulatory Visit | Attending: Endocrinology | Admitting: Endocrinology

## 2013-01-18 DIAGNOSIS — E0590 Thyrotoxicosis, unspecified without thyrotoxic crisis or storm: Secondary | ICD-10-CM | POA: Insufficient documentation

## 2013-01-19 ENCOUNTER — Encounter (HOSPITAL_COMMUNITY)
Admission: RE | Admit: 2013-01-19 | Discharge: 2013-01-19 | Disposition: A | Discharge status: Home / Self Care | Payer: Self-pay | Source: Ambulatory Visit | Attending: Endocrinology | Admitting: Endocrinology

## 2013-01-19 MED ORDER — SODIUM PERTECHNETATE TC 99M INJECTION
10.1000 | Freq: Once | INTRAVENOUS | Status: AC | PRN
  Administered 2013-01-19: 10.1 via INTRAVENOUS

## 2013-01-19 MED ORDER — SODIUM IODIDE I 131 CAPSULE
12.9000 | Freq: Once | INTRAVENOUS | Status: AC | PRN
  Administered 2013-01-19: 12.9 via ORAL

## 2013-01-22 ENCOUNTER — Other Ambulatory Visit: Payer: Self-pay | Admitting: Endocrinology

## 2013-02-01 ENCOUNTER — Encounter (HOSPITAL_COMMUNITY)
Admission: RE | Admit: 2013-02-01 | Discharge: 2013-02-01 | Disposition: A | Discharge status: Home / Self Care | Payer: Self-pay | Source: Ambulatory Visit | Attending: Endocrinology | Admitting: Endocrinology

## 2013-02-01 DIAGNOSIS — E0590 Thyrotoxicosis, unspecified without thyrotoxic crisis or storm: Secondary | ICD-10-CM | POA: Insufficient documentation

## 2013-02-01 LAB — HCG, SERUM, QUALITATIVE: Preg, Serum: NEGATIVE

## 2013-02-01 MED ORDER — SODIUM IODIDE I 131 CAPSULE
16.5000 | Freq: Once | INTRAVENOUS | Status: AC | PRN
  Administered 2013-02-01: 16.5 via ORAL

## 2013-02-27 ENCOUNTER — Ambulatory Visit: Payer: Self-pay | Admitting: Physician Assistant

## 2013-02-27 VITALS — BP 124/80 | HR 82 | Temp 97.9°F | Resp 16 | Ht 59.0 in | Wt 113.0 lb

## 2013-02-27 DIAGNOSIS — N390 Urinary tract infection, site not specified: Secondary | ICD-10-CM

## 2013-02-27 DIAGNOSIS — R300 Dysuria: Secondary | ICD-10-CM

## 2013-02-27 LAB — POCT UA - MICROSCOPIC ONLY
Casts, Ur, LPF, POC: NEGATIVE
Crystals, Ur, HPF, POC: NEGATIVE
Mucus, UA: NEGATIVE
Yeast, UA: NEGATIVE

## 2013-02-27 LAB — POCT URINALYSIS DIPSTICK
Bilirubin, UA: NEGATIVE
Glucose, UA: NEGATIVE
Ketones, UA: NEGATIVE
Nitrite, UA: NEGATIVE
Protein, UA: 100
Spec Grav, UA: 1.025
Urobilinogen, UA: 0.2
pH, UA: 5.5

## 2013-02-27 LAB — POCT WET PREP WITH KOH: Yeast Wet Prep HPF POC: NEGATIVE

## 2013-02-27 MED ORDER — NITROFURANTOIN MONOHYD MACRO 100 MG PO CAPS: 100.0000 mg | ORAL_CAPSULE | Freq: Two times a day (BID) | ORAL | Status: DC

## 2013-02-27 NOTE — Patient Instructions (Addendum)
Begin taking the antibiotic as directed.  Be sure to finish the full course.  Drink plenty of water.  If you are worsening or not improving, please let us know.  Urinary Tract Infection Urinary tract infections (UTIs) can develop anywhere along your urinary tract. Your urinary tract is your body's drainage system for removing wastes and extra water. Your urinary tract includes two kidneys, two ureters, a bladder, and a urethra. Your kidneys are a pair of bean-shaped organs. Each kidney is about the size of your fist. They are located below your ribs, one on each side of your spine. CAUSES Infections are caused by microbes, which are microscopic organisms, including fungi, viruses, and bacteria. These organisms are so small that they can only be seen through a microscope. Bacteria are the microbes that most commonly cause UTIs. SYMPTOMS  Symptoms of UTIs may vary by age and gender of the patient and by the location of the infection. Symptoms in young women typically include a frequent and intense urge to urinate and a painful, burning feeling in the bladder or urethra during urination. Older women and men are more likely to be tired, shaky, and weak and have muscle aches and abdominal pain. A fever may mean the infection is in your kidneys. Other symptoms of a kidney infection include pain in your back or sides below the ribs, nausea, and vomiting. DIAGNOSIS To diagnose a UTI, your caregiver will ask you about your symptoms. Your caregiver also will ask to provide a urine sample. The urine sample will be tested for bacteria and white blood cells. White blood cells are made by your body to help fight infection. TREATMENT  Typically, UTIs can be treated with medication. Because most UTIs are caused by a bacterial infection, they usually can be treated with the use of antibiotics. The choice of antibiotic and length of treatment depend on your symptoms and the type of bacteria causing your infection. HOME  CARE INSTRUCTIONS  If you were prescribed antibiotics, take them exactly as your caregiver instructs you. Finish the medication even if you feel better after you have only taken some of the medication.  Drink enough water and fluids to keep your urine clear or pale yellow.  Avoid caffeine, tea, and carbonated beverages. They tend to irritate your bladder.  Empty your bladder often. Avoid holding urine for long periods of time.  Empty your bladder before and after sexual intercourse.  After a bowel movement, women should cleanse from front to back. Use each tissue only once. SEEK MEDICAL CARE IF:   You have back pain.  You develop a fever.  Your symptoms do not begin to resolve within 3 days. SEEK IMMEDIATE MEDICAL CARE IF:   You have severe back pain or lower abdominal pain.  You develop chills.  You have nausea or vomiting.  You have continued burning or discomfort with urination. MAKE SURE YOU:   Understand these instructions.  Will watch your condition.  Will get help right away if you are not doing well or get worse. Document Released: 08/25/2005 Document Revised: 05/16/2012 Document Reviewed: 12/24/2011 [facility] Patient Information 2013 [place], [place].

## 2013-02-27 NOTE — Progress Notes (Signed)
Subjective:    Patient ID: Teresa Michael, female    DOB: 01-27-1976, 37 y.o.   MRN: [id number]  HPI   [name] is a 37 yr old female here with concern for UTI.  Noted pain with urination and burning sensation after urinating.  This began yesterday, but is worse today.  Also noted a little pink when wiping this morning but no gross hematuria.  Some cramping pain in the lower abdomen.  Denies back pain, nausea, vomiting, fever, chills.  Thinks she may have had a UTI about 9-10 yrs ago.  Has noted a little more vaginal discharge than usual.  Just had a physical and pap recently, everything was normal.  LMP 02/16/13.   Review of Systems  Constitutional: Negative for fever and chills.  HENT: Negative.   Respiratory: Negative.   Cardiovascular: Negative.   Gastrointestinal: Positive for abdominal pain (cramping). Negative for nausea, vomiting and diarrhea.  Genitourinary: Positive for dysuria, hematuria and vaginal discharge. Negative for frequency.  Musculoskeletal: Negative.   Skin: Negative.   Neurological: Negative.        Objective:   Physical Exam  Vitals reviewed. Constitutional: She is oriented to person, place, and time. She appears well-developed and well-nourished. No distress.  HENT:  Head: Normocephalic and atraumatic.  Eyes: Conjunctivae are normal. No scleral icterus.  Cardiovascular: Normal rate, regular rhythm and normal heart sounds.  Exam reveals no gallop and no friction rub.   No murmur heard. Pulmonary/Chest: Effort normal and breath sounds normal. She has no wheezes. She has no rales.  Abdominal: Soft. Bowel sounds are normal. There is no tenderness. There is no CVA tenderness.  Genitourinary: Uterus normal. There is no rash, tenderness or lesion on the right labia. There is no rash, tenderness or lesion on the left labia. Cervix exhibits no motion tenderness, no discharge and no friability. Right adnexum displays no mass, no tenderness and no fullness. Left adnexum  displays no mass, no tenderness and no fullness. Vaginal discharge (yellow/white) found.  Neurological: She is alert and oriented to person, place, and time.  Skin: Skin is warm and dry.  Psychiatric: She has a normal mood and affect. Her behavior is normal.     Filed Vitals:   02/27/13 0949  BP: 124/80  Pulse: 82  Temp: 97.9 F (36.6 C)  Resp: 16     Results for orders placed in visit on 02/27/13  POCT URINALYSIS DIPSTICK      Result Value Range   Color, UA amber     Clarity, UA cloudy     Glucose, UA neg     Bilirubin, UA neg     Ketones, UA neg     Spec Grav, UA 1.025     Blood, UA large     pH, UA 5.5     Protein, UA 100     Urobilinogen, UA 0.2     Nitrite, UA neg     Leukocytes, UA moderate (2+)    POCT UA - MICROSCOPIC ONLY      Result Value Range   WBC, Ur, HPF, POC TNTC     RBC, urine, microscopic TNTC     Bacteria, U Microscopic 2+     Mucus, UA neg     Epithelial cells, urine per micros 1-3     Crystals, Ur, HPF, POC neg     Casts, Ur, LPF, POC neg     Yeast, UA neg    POCT WET PREP WITH KOH  Result Value Range   Trichomonas, UA Negative     Clue Cells Wet Prep HPF POC 0-2     Epithelial Wet Prep HPF POC 3-6     Yeast Wet Prep HPF POC negative     Bacteria Wet Prep HPF POC 1+     RBC Wet Prep HPF POC 0-2     WBC Wet Prep HPF POC 15-25     KOH Prep POC Negative          Assessment & Plan:  UTI (urinary tract infection) - Plan: nitrofurantoin, macrocrystal-monohydrate, (MACROBID) 100 MG capsule  Dysuria - Plan: POCT urinalysis dipstick, POCT UA - Microscopic Only, POCT Wet Prep with KOH, Urine culture   [name] is a very pleasant 37 yr old female with UTI.  UA with large blood and pos leukocytes.  Urine culture sent.  Will start Macrobid BID x 5 days.  Will adjust if necessary based on culture data.  Encouraged plenty of fluids.  If worsening or not improving, pt will RTC.

## 2013-03-02 LAB — URINE CULTURE: Colony Count: >=100000

## 2013-03-08 ENCOUNTER — Ambulatory Visit: Payer: Self-pay | Admitting: Emergency Medicine

## 2013-03-08 VITALS — BP 130/76 | HR 90 | Temp 97.9°F | Resp 16 | Ht 60.5 in | Wt 119.2 lb

## 2013-03-08 DIAGNOSIS — M26629 Arthralgia of temporomandibular joint, unspecified side: Secondary | ICD-10-CM

## 2013-03-08 MED ORDER — NAPROXEN SODIUM 550 MG PO TABS: 550.0000 mg | ORAL_TABLET | Freq: Two times a day (BID) | ORAL | Status: AC

## 2013-03-08 MED ORDER — DIAZEPAM 2 MG PO TABS: 2.0000 mg | ORAL_TABLET | Freq: Four times a day (QID) | ORAL | Status: DC | PRN

## 2013-03-08 NOTE — Progress Notes (Signed)
[facility] [address] [place] [address] [phone]  Date:  03/08/2013   Name:  Teresa Michael   DOB:  November 23, 1976   MRN:  [id number]  PCP:  Nilda Simmer, MD    Chief Complaint: Jaw Pain   History of Present Illness:  Teresa Michael is a 37 y.o. very pleasant female patient who presents with the following:  Has two week history of pain in right TMJ.  No history of injury or overuse.  Pain with chewing.  No fever chills, sore throat, or dental complaints.  Thinks she clenches her teeth.  No improvement with over the counter medications or other home remedies. Denies other complaint or health concern today.   There is no problem list on file for this patient.   Past Medical History  Diagnosis Date  . Thyroid disease     Past Surgical History  Procedure Laterality Date  . Cesarean section      History  Substance Use Topics  . Smoking status: Never Smoker   . Smokeless tobacco: Not on file  . Alcohol Use: Yes     Comment: special occasions    Family History  Problem Relation Age of Onset  . Depression Mother   . Hypertension Mother   . Hypertension Father   . Depression Brother   . Hypertension Maternal Grandfather     No Known Allergies  Medication list has been reviewed and updated.  Current Outpatient Prescriptions on File Prior to Visit  Medication Sig Dispense Refill  . metoprolol tartrate (LOPRESSOR) 25 MG tablet Take 1 tablet (25 mg total) by mouth 2 (two) times daily.  60 tablet  1  . nitrofurantoin, macrocrystal-monohydrate, (MACROBID) 100 MG capsule Take 1 capsule (100 mg total) by mouth 2 (two) times daily.  10 capsule  0   No current facility-administered medications on file prior to visit.    Review of Systems:  As per HPI, otherwise negative.    Physical Examination: Filed Vitals:   03/08/13 1458  BP: 130/76  Pulse: 90  Temp: 97.9 F (36.6 C)  Resp: 16   Filed Vitals:   03/08/13 1458  Height: 5' 0.5"  (1.537 m)  Weight: 119 lb 3.2 oz (54.069 kg)   Body mass index is 22.89 kg/(m^2). Ideal Body Weight: Weight in (lb) to have BMI = 25: 129.9   GEN: WDWN, NAD, Non-toxic, Alert & Oriented x 3 HEENT: Atraumatic, Normocephalic.  Ears and Nose: No external deformity. EXTR: No clubbing/cyanosis/edema NEURO: Normal gait.  PSYCH: Normally interactive. Conversant. Not depressed or anxious appearing.  Calm demeanor.  TMJ:  Right tender with no crepitus or swelling.  Occlusion and dentition intact.  Assessment and Plan: TMJ dysfunction Valium naproxyn Follow up with dentist.   Signed,  Phillips Odor, MD

## 2013-03-08 NOTE — Patient Instructions (Addendum)
Disfuncin de la articulacin temporomandibular (Temporomandibular Problems) Una disfuncin de la articulacin temporomandibular implica que existen problemas en la articulacin que se encuentra entre la [place] y el crneo. Esta articulacin est delimitada por un cartlago, como otras articulaciones del cuerpo, pero tambin tiene un pequeo disco que impide que los huesos se raspen uno con otro. Estas articulaciones son como cualquier otra y pueden inflamarse (irritarse) por artritis y Engineer, site. Cuando esta articulacin duele, tambin puede producir dolor de cabeza, Engineer, mining en la mandbula y tambin en el rostro. CAUSAS  Generalmente los problemas de tipo artrtico son producidos por la inflamacin de Nurse, learning disability. La inflamacin tambin puede ser causada por el [place]. Puede haberse ocasionado al [employer] (bruxismo). Tambin puede producirse por la mala alineacin de Nurse, learning disability. DIAGNSTICO Generalmente el diagnstico (determinar cul es el problema) se realiza a travs de los antecedentes y el examen fsico. El profesional le solicitar radiografas o una imagen por resonancia magntica (IRM) para determinar la causa exacta. Podr ser necesario que visite al dentista para determinar si los dientes y la mandbula estn bien alineados. TRATAMIENTO En la mayora de las veces no se trata de un problema grave. En algunos casos puede persistir (hacerse crnico). Cuando esto ocurre, los [employer] reducen la inflamacin (irritacin) pueden ser de [place]. Tambin podr aliviarlo una inyeccin de corticoides. Si los dientes no estn alineados, el dentista lo solucionar con ortodoncia. Si no se encuentran causas fsicas, el origen puede estar en la tensin. Si sta es la causa, le sern de utilidad las tcnicas de biorretroalimentacin y Microbiologist. INSTRUCCIONES PARA EL CUIDADO DOMICILIARIO  Puede aliviarlo la aplicacin de una bolsa con hielo. Coloque el hielo en  una bolsa plstica y envulvala en una toalla para prevenir el congelamiento de la piel. Podr aplicarlo cada 2 horas durante 20  30 minutos, o cuando lo crea necesario, mientras se encuentre despierto, o segn se lo haya indicado el profesional que lo asiste.  Utilice los medicamentos de venta libre o de prescripcin para Chief Technology Officer, Environmental health practitioner o la [place], segn se lo indique el profesional que lo asiste.  Si le indican fisioterapia, siga las indicaciones de su mdico.  Si le indicaron aparatos de ortodoncia, selos segn las indicaciones. Document Released: 08/25/2005 Document Revised: 02/07/2012 [facility] Patient Information 2013 [place], [place].

## 2013-10-04 ENCOUNTER — Ambulatory Visit: Payer: Self-pay | Admitting: Family Medicine

## 2013-10-04 VITALS — BP 138/80 | HR 76 | Temp 98.6°F | Resp 18 | Ht 59.25 in | Wt 129.2 lb

## 2013-10-04 DIAGNOSIS — R05 Cough: Secondary | ICD-10-CM

## 2013-10-04 DIAGNOSIS — J029 Acute pharyngitis, unspecified: Secondary | ICD-10-CM

## 2013-10-04 DIAGNOSIS — E039 Hypothyroidism, unspecified: Secondary | ICD-10-CM

## 2013-10-04 DIAGNOSIS — R059 Cough, unspecified: Secondary | ICD-10-CM

## 2013-10-04 MED ORDER — HYDROCODONE-HOMATROPINE 5-1.5 MG/5ML PO SYRP: 5.0000 mL | ORAL_SOLUTION | ORAL | Status: DC | PRN

## 2013-10-04 NOTE — Patient Instructions (Signed)
Faringitis Viral  (Viral Pharyngitis)   La faringitis virales una infección viral que produce enrojecimiento, dolor e hinchazón (inflamación) en la garganta. No se disemina de una persona a otra (no es contagiosa).   CAUSAS  La causa es la inhalación de una gran cantidad de gérmenes llamados virus. Muchos virus diferentes pueden causar faringitis viral.  SÍNTOMAS  Los síntomas de faringitis viral son:  · Dolor de garganta  · Cansancio.  · Nariz tapada.  · Fiebre no muy elevada  · Congestión  · Tos  TRATAMIENTO  El tratamiento incluye reposo, beber muchos líquidos y el uso de medicamentos de venta libre (autorizados por el médico)  INSTRUCCIONES PARA EL CUIDADO EN EL HOGAR   · Debe ingerir gran cantidad de líquido para mantener la orina de tono claro o color amarillo pálido.  · Consuma alimentos blandos, fríos, como helados de crema, de agua o gelatina.  · Puede hacer gárgaras con agua tibia con sal (una cucharadita en 1 litro de agua).  · Después de los 7 años, pueden administrarse pastillas para la tos con seguridad.  · Solo tome medicamentos que se pueden comprar sin receta o recetados para el dolor, malestar o fiebre, como le indica el médico. No tome aspirina  Para no contagiar evite:  · El contacto boca a boca con otras personas.  · Compartir utensilios para comer o beber.  · Toser cerca de otras personas  SOLICITE ATENCIÓN MÉDICA SI:   · Mejora luego de algunos días pero luego empeora.  · Tiene fiebre o siente un dolor intenso que no puede ser controlado con los medicamentos.  · Hay otros cambios que lo preocupan.  Document Released: 08/25/2005 Document Revised: 02/07/2012  Exit[employer]® Patient Information ©2014 [employer].

## 2013-10-04 NOTE — Progress Notes (Signed)
Subjective: 37 year old [place] American lady who is here with a history of a sore throat since Monday. It started as a scratchy throat. His gotten worse. She has lost her voice. She felt a little hot but no defined fevers. She's had mild runny nose and cough. She works as a Catering manager. She is generally been healthy. She does have a history of having had strep a few years ago.  Objective: Pleasant lady in no acute distress she understands and speaks [place] well. Her TMs are normal. Minimal sniffles but nose not congested. Throat has mild erythema strep screen was taken. It is difficult to actually get a good view of her throat she has such a bad gag reflex. Neck supple with some anterior cervical tenderness but no major nodes. Her chest is clear to auscultation. Heart regular without murmurs.  Assessment: Pharyngitis  Plan:  Strep screen. If negative will send throat culture.  Results for orders placed in visit on 10/04/13  POCT RAPID STREP A (OFFICE)      Result Value Range   Rapid Strep A Screen Negative  Negative

## 2013-10-06 LAB — CULTURE, GROUP A STREP: Organism ID, Bacteria: NORMAL

## 2014-04-29 ENCOUNTER — Ambulatory Visit (INDEPENDENT_AMBULATORY_CARE_PROVIDER_SITE_OTHER): Payer: Self-pay | Admitting: Obstetrics & Gynecology

## 2014-04-29 ENCOUNTER — Encounter: Payer: Self-pay | Admitting: Obstetrics & Gynecology

## 2014-04-29 VITALS — BP 133/91 | HR 76 | Temp 97.8°F | Ht 60.0 in | Wt 133.0 lb

## 2014-04-29 DIAGNOSIS — N939 Abnormal uterine and vaginal bleeding, unspecified: Secondary | ICD-10-CM

## 2014-04-29 DIAGNOSIS — N926 Irregular menstruation, unspecified: Secondary | ICD-10-CM

## 2014-05-15 ENCOUNTER — Ambulatory Visit (INDEPENDENT_AMBULATORY_CARE_PROVIDER_SITE_OTHER): Payer: Self-pay | Admitting: Obstetrics & Gynecology

## 2014-05-15 ENCOUNTER — Encounter: Payer: Self-pay | Admitting: Obstetrics & Gynecology

## 2014-05-15 VITALS — BP 133/96 | HR 76 | Temp 97.0°F | Ht 60.0 in | Wt 132.0 lb

## 2014-05-15 DIAGNOSIS — Z01419 Encounter for gynecological examination (general) (routine) without abnormal findings: Secondary | ICD-10-CM

## 2014-05-15 DIAGNOSIS — Z113 Encounter for screening for infections with a predominantly sexual mode of transmission: Secondary | ICD-10-CM

## 2014-05-15 NOTE — Progress Notes (Signed)
Subjective:     Teresa Michael is a 38 y.o. female here for a routine exam. C/O an episode of prolonged, vaginal bleeding   Personal health questionnaire:  Is patient Ashkenazi Jewish, have a family history of breast and/or ovarian cancer: no Is there a family history of uterine cancer diagnosed at age < 4150, gastrointestinal cancer, urinary tract cancer, family member who is a Personnel officerLynch syndrome-associated carrier: no Is the patient overweight and hypertensive, family history of diabetes, personal history of gestational diabetes or PCOS: no Is patient over 8855, have PCOS,  family history of premature CHD under age 39, diabetes, smoke, have hypertension or peripheral artery disease:  no   Gynecologic History Patient's last menstrual period was 05/02/2014.   Obstetric History OB History  Gravida Para Term Preterm AB SAB TAB Ectopic Multiple Living  2 2 2       2     # Outcome Date GA Lbr Len/2nd Weight Sex Delivery Anes PTL Lv  2 TRM 05/03/11 [id number]  3.629 kg (8 lb) M LTCS EPI  Y  1 TRM 01/16/00 419w0d  3.345 kg (7 lb 6 oz) F LTCS EPI  Y     Comments: System Generated. Please review and update pregnancy details.      Past Medical History  Diagnosis Date  . Thyroid disease     Past Surgical History  Procedure Laterality Date  . Cesarean section      Current outpatient prescriptions:levothyroxine (SYNTHROID, LEVOTHROID) 112 MCG tablet, Take 112 mcg by mouth daily before breakfast., Disp: , Rfl: ;  medroxyPROGESTERone (PROVERA) 10 MG tablet, Take 2 tablets (20 mg total) by mouth 3 (three) times daily. For one week., Disp: 42 tablet, Rfl: 0 No Known Allergies  History  Substance Use Topics  . Smoking status: Never Smoker   . Smokeless tobacco: Not on file  . Alcohol Use: Yes     Comment: special occasions    Family History  Problem Relation Age of Onset  . Depression Mother   . Hypertension Mother   . Hypertension Father   . Depression Brother   . Hypertension Maternal Grandfather        Review of Systems  Constitutional: negative for fatigue and weight loss Respiratory: negative for cough and wheezing Cardiovascular: negative for chest pain, fatigue and palpitations Gastrointestinal: negative for abdominal pain and change in bowel habits Musculoskeletal:negative for myalgias Neurological: negative for gait problems and tremors Behavioral/Psych: negative for abusive relationship, depression Endocrine: negative for temperature intolerance   Genitourinary:positive for abnormal menstrual periods; negative for genital lesions, hot flashes, sexual problems and vaginal discharge Integument/breast: negative for breast lump, breast tenderness, nipple discharge and skin lesion(s)    Objective:       BP 133/96  Pulse 76  Temp(Src) 97 F (36.1 C)  Ht 5' (1.524 m)  Wt 59.875 kg (132 lb)  BMI 25.78 kg/m2  LMP 05/02/2014 General:   alert  Skin:   no rash or abnormalities  Lungs:   clear to auscultation bilaterally  Heart:   regular rate and rhythm, S1, S2 normal, no murmur, click, rub or gallop  Breasts:   normal without suspicious masses, skin or nipple changes or axillary nodes  Abdomen:  normal findings: no organomegaly, soft, non-tender and no hernia  Pelvis:  External genitalia: normal general appearance Urinary system: urethral meatus normal and bladder without fullness, nontender Vaginal: menstrual blood present Cervix: normal appearance Adnexa: normal bimanual exam Uterus: anteverted and non-tender, normal size   Lab Review Urine  pregnancy test Labs reviewed no Radiologic studies reviewed no   Assessment:    Healthy female exam.  AUB--likely O Plan:    High dose provera to arrest the current bleeding episode-->COCP  Meds ordered this encounter  Medications  . DISCONTD: medroxyPROGESTERone (PROVERA) 10 MG tablet    Sig: Take 20 mg by mouth 3 (three) times daily. For one week.  . medroxyPROGESTERone (PROVERA) 10 MG tablet    Sig: Take 2  tablets (20 mg total) by mouth 3 (three) times daily. For one week.    Dispense:  42 tablet    Refill:  0   Orders Placed This Encounter  Procedures  . WET PREP BY MOLECULAR PROBE  . GC/Chlamydia Probe Amp  . HIV antibody  . RPR  . Hemoglobin and hematocrit, blood    Follow up in a few months

## 2014-05-16 ENCOUNTER — Encounter: Payer: Self-pay | Admitting: Obstetrics & Gynecology

## 2014-05-16 LAB — HEMOGLOBIN AND HEMATOCRIT, BLOOD
HEMATOCRIT: 40.1 % (ref 36.0–46.0)
HEMOGLOBIN: 13 g/dL (ref 12.0–15.0)

## 2014-05-16 LAB — HIV ANTIBODY (ROUTINE TESTING W REFLEX): HIV: NONREACTIVE

## 2014-05-16 LAB — RPR

## 2014-05-17 ENCOUNTER — Telehealth: Payer: Self-pay | Admitting: *Deleted

## 2014-05-17 MED ORDER — MEDROXYPROGESTERONE ACETATE 10 MG PO TABS: 20.0000 mg | ORAL_TABLET | Freq: Three times a day (TID) | ORAL | Status: DC

## 2014-05-17 NOTE — Patient Instructions (Signed)
Abnormal Uterine Bleeding Abnormal uterine bleeding can affect women at various stages in life, including teenagers, women in their reproductive years, pregnant women, and women who have reached menopause. Several kinds of uterine bleeding are considered abnormal, including:  Bleeding or spotting between periods.   Bleeding after sexual intercourse.   Bleeding that is heavier or more than normal.   Periods that last longer than usual.  Bleeding after menopause.  Many cases of abnormal uterine bleeding are minor and simple to treat, while others are more serious. Any type of abnormal bleeding should be evaluated by your health care provider. Treatment will depend on the cause of the bleeding. HOME CARE INSTRUCTIONS Monitor your condition for any changes. The following actions may help to alleviate any discomfort you are experiencing:  Avoid the use of tampons and douches as directed by your health care provider.  Change your pads frequently. You should get regular pelvic exams and Pap tests. Keep all follow-up appointments for diagnostic tests as directed by your health care provider.  SEEK MEDICAL CARE IF:   Your bleeding lasts more than 1 week.   You feel dizzy at times.  SEEK IMMEDIATE MEDICAL CARE IF:   You pass out.   You are changing pads every 15 to 30 minutes.   You have abdominal pain.  You have a fever.   You become sweaty or weak.   You are passing large blood clots from the vagina.   You start to feel nauseous and vomit. MAKE SURE YOU:   Understand these instructions.  Will watch your condition.  Will get help right away if you are not doing well or get worse. Document Released: 11/15/2005 Document Revised: 11/20/2013 Document Reviewed: 06/14/2013 [facility] Patient Information 2015 [place], [place]. This information is not intended to replace advice given to you by your health care provider. Make sure you discuss any questions you have with your  health care provider. Health Maintenance, Female A healthy lifestyle and preventative care can promote health and wellness.  Maintain regular health, dental, and eye exams.  Eat a healthy diet. Foods like vegetables, fruits, whole grains, low-fat dairy products, and lean protein foods contain the nutrients you need without too many calories. Decrease your intake of foods high in solid fats, added sugars, and salt. Get information about a proper diet from your caregiver, if necessary.  Regular physical exercise is one of the most important things you can do for your health. Most adults should get at least 150 minutes of moderate-intensity exercise (any activity that increases your heart rate and causes you to sweat) each week. In addition, most adults need muscle-strengthening exercises on 2 or more days a week.   Maintain a healthy weight. The body mass index (BMI) is a screening tool to identify possible weight problems. It provides an estimate of body fat based on height and weight. Your caregiver can help determine your BMI, and can help you achieve or maintain a healthy weight. For adults 20 years and older:  A BMI below 18.5 is considered underweight.  A BMI of 18.5 to 24.9 is normal.  A BMI of 25 to 29.9 is considered overweight.  A BMI of 30 and above is considered obese.  Maintain normal blood lipids and cholesterol by exercising and minimizing your intake of saturated fat. Eat a balanced diet with plenty of fruits and vegetables. Blood tests for lipids and cholesterol should begin at age 54 and be repeated every 5 years. If your lipid or cholesterol levels  are high, you are over 50, or you are a high risk for heart disease, you may need your cholesterol levels checked more frequently.Ongoing high lipid and cholesterol levels should be treated with medicines if diet and exercise are not effective.  If you smoke, find out from your caregiver how to quit. If you do not use tobacco, do  not start.  Lung cancer screening is recommended for adults aged 51-80 years who are at high risk for developing lung cancer because of a history of smoking. Yearly low-dose computed tomography (CT) is recommended for people who have at least a 30-pack-year history of smoking and are a current smoker or have quit within the past 15 years. A pack year of smoking is smoking an average of 1 pack of cigarettes a day for 1 year (for example: 1 pack a day for 30 years or 2 packs a day for 15 years). Yearly screening should continue until the smoker has stopped smoking for at least 15 years. Yearly screening should also be stopped for people who develop a health problem that would prevent them from having lung cancer treatment.  If you are pregnant, do not drink alcohol. If you are breastfeeding, be very cautious about drinking alcohol. If you are not pregnant and choose to drink alcohol, do not exceed 1 drink per day. One drink is considered to be 12 ounces (355 mL) of beer, 5 ounces (148 mL) of wine, or 1.5 ounces (44 mL) of liquor.  Avoid use of street drugs. Do not share needles with anyone. Ask for help if you need support or instructions about stopping the use of drugs.  High blood pressure causes heart disease and increases the risk of stroke. Blood pressure should be checked at least every 1 to 2 years. Ongoing high blood pressure should be treated with medicines, if weight loss and exercise are not effective.  If you are 2 to 39 years old, ask your caregiver if you should take aspirin to prevent strokes.  Diabetes screening involves taking a blood sample to check your fasting blood sugar level. This should be done once every 3 years, after age 34, if you are within normal weight and without risk factors for diabetes. Testing should be considered at a younger age or be carried out more frequently if you are overweight and have at least 1 risk factor for diabetes.  Breast cancer screening is essential  preventative care for women. You should practice "breast self-awareness." This means understanding the normal appearance and feel of your breasts and may include breast self-examination. Any changes detected, no matter how small, should be reported to a caregiver. Women in their 40s and 30s should have a clinical breast exam (CBE) by a caregiver as part of a regular health exam every 1 to 3 years. After age 8, women should have a CBE every year. Starting at age 32, women should consider having a mammogram (breast X-ray) every year. Women who have a family history of breast cancer should talk to their caregiver about genetic screening. Women at a high risk of breast cancer should talk to their caregiver about having an MRI and a mammogram every year.  Breast cancer gene (BRCA)-related cancer risk assessment is recommended for women who have family members with BRCA-related cancers. BRCA-related cancers include breast, ovarian, tubal, and peritoneal cancers. Having family members with these cancers may be associated with an increased risk for harmful changes (mutations) in the breast cancer genes BRCA1 and BRCA2. Results of the  assessment will determine the need for genetic counseling and BRCA1 and BRCA2 testing.  The Pap test is a screening test for cervical cancer. Women should have a Pap test starting at age 11. Between ages 80 and 31, Pap tests should be repeated every 2 years. Beginning at age 69, you should have a Pap test every 3 years as long as the past 3 Pap tests have been normal. If you had a hysterectomy for a problem that was not cancer or a condition that could lead to cancer, then you no longer need Pap tests. If you are between ages 79 and 64, and you have had normal Pap tests going back 10 years, you no longer need Pap tests. If you have had past treatment for cervical cancer or a condition that could lead to cancer, you need Pap tests and screening for cancer for at least 20 years after your  treatment. If Pap tests have been discontinued, risk factors (such as a new sexual partner) need to be reassessed to determine if screening should be resumed. Some women have medical problems that increase the chance of getting cervical cancer. In these cases, your caregiver may recommend more frequent screening and Pap tests.  The human papillomavirus (HPV) test is an additional test that may be used for cervical cancer screening. The HPV test looks for the virus that can cause the cell changes on the cervix. The cells collected during the Pap test can be tested for HPV. The HPV test could be used to screen women aged 38 years and older, and should be used in women of any age who have unclear Pap test results. After the age of 24, women should have HPV testing at the same frequency as a Pap test.  Colorectal cancer can be detected and often prevented. Most routine colorectal cancer screening begins at the age of 30 and continues through age 8. However, your caregiver may recommend screening at an earlier age if you have risk factors for colon cancer. On a yearly basis, your caregiver may provide home test kits to check for hidden blood in the stool. Use of a small camera at the end of a tube, to directly examine the colon (sigmoidoscopy or colonoscopy), can detect the earliest forms of colorectal cancer. Talk to your caregiver about this at age 59, when routine screening begins. Direct examination of the colon should be repeated every 5 to 10 years through age 75, unless early forms of pre-cancerous polyps or small growths are found.  Hepatitis C blood testing is recommended for all people born from [birth year] through 1966 and any individual with known risks for hepatitis C.  Practice safe sex. Use condoms and avoid high-risk sexual practices to reduce the spread of sexually transmitted infections (STIs). Sexually active women aged 7 and younger should be checked for Chlamydia, which is a common sexually  transmitted infection. Older women with new or multiple partners should also be tested for Chlamydia. Testing for other STIs is recommended if you are sexually active and at increased risk.  Osteoporosis is a disease in which the bones lose minerals and strength with aging. This can result in serious bone fractures. The risk of osteoporosis can be identified using a bone density scan. Women ages 72 and over and women at risk for fractures or osteoporosis should discuss screening with their caregivers. Ask your caregiver whether you should be taking a calcium supplement or vitamin D to reduce the rate of osteoporosis.  Menopause can be associated  with physical symptoms and risks. Hormone replacement therapy is available to decrease symptoms and risks. You should talk to your caregiver about whether hormone replacement therapy is right for you.  Use sunscreen. Apply sunscreen liberally and repeatedly throughout the day. You should seek shade when your shadow is shorter than you. Protect yourself by wearing long sleeves, pants, a wide-brimmed hat, and sunglasses year round, whenever you are outdoors.  Notify your caregiver of new moles or changes in moles, especially if there is a change in shape or color. Also notify your caregiver if a mole is larger than the size of a pencil eraser.  Stay current with your immunizations. Document Released: 05/31/2011 Document Revised: 03/12/2013 Document Reviewed: 10/17/2013 [facility] Patient Information 2015 [place], [place]. This information is not intended to replace advice given to you by your health care provider. Make sure you discuss any questions you have with your health care provider.

## 2014-05-17 NOTE — Telephone Encounter (Signed)
Pt called in to office in regards to Rx that was to be sent at last visit, 05/15/14. Return call to pt to make her aware there was a mistake in ordering and it was not sent electronically. Let pt know that presription , Provera, was resent with receipt confirmed by pharmacy.

## 2014-05-18 LAB — GC/CHLAMYDIA PROBE AMP
CT Probe RNA: NEGATIVE
GC PROBE AMP APTIMA: NEGATIVE

## 2014-08-03 NOTE — Patient Instructions (Signed)
Abnormal Uterine Bleeding Abnormal uterine bleeding can affect women at various stages in life, including teenagers, women in their reproductive years, pregnant women, and women who have reached menopause. Several kinds of uterine bleeding are considered abnormal, including:  Bleeding or spotting between periods.   Bleeding after sexual intercourse.   Bleeding that is heavier or more than normal.   Periods that last longer than usual.  Bleeding after menopause.  Many cases of abnormal uterine bleeding are minor and simple to treat, while others are more serious. Any type of abnormal bleeding should be evaluated by your health care provider. Treatment will depend on the cause of the bleeding. HOME CARE INSTRUCTIONS Monitor your condition for any changes. The following actions may help to alleviate any discomfort you are experiencing:  Avoid the use of tampons and douches as directed by your health care provider.  Change your pads frequently. You should get regular pelvic exams and Pap tests. Keep all follow-up appointments for diagnostic tests as directed by your health care provider.  SEEK MEDICAL CARE IF:   Your bleeding lasts more than 1 week.   You feel dizzy at times.  SEEK IMMEDIATE MEDICAL CARE IF:   You pass out.   You are changing pads every 15 to 30 minutes.   You have abdominal pain.  You have a fever.   You become sweaty or weak.   You are passing large blood clots from the vagina.   You start to feel nauseous and vomit. MAKE SURE YOU:   Understand these instructions.  Will watch your condition.  Will get help right away if you are not doing well or get worse. Document Released: 11/15/2005 Document Revised: 11/20/2013 Document Reviewed: 06/14/2013 ExitCare Patient Information 2015 [employer]. This information is not intended to replace advice given to you by your health care provider. Make sure you discuss any questions you have with your  health care provider.  

## 2014-08-03 NOTE — Progress Notes (Signed)
Patient ID: Teresa Michael, female   DOB: October 18, 1976, 38 y.o.   MRN: [id number]  Chief Complaint  Patient presents with  . Gynecologic Exam    HPI Teresa Michael is a 38 y.o. female.  The patient reports a current episode of prolonged, heavy bleeding.  She report that she is euthyroid at present.  HPI  Past Medical History  Diagnosis Date  . Thyroid disease     Past Surgical History  Procedure Laterality Date  . Cesarean section      Family History  Problem Relation Age of Onset  . Depression Mother   . Hypertension Mother   . Hypertension Father   . Depression Brother   . Hypertension Maternal Grandfather     Social History History  Substance Use Topics  . Smoking status: Never Smoker   . Smokeless tobacco: Not on file  . Alcohol Use: Yes     Comment: special occasions    No Known Allergies  Current Outpatient Prescriptions  Medication Sig Dispense Refill  . levothyroxine (SYNTHROID, LEVOTHROID) 112 MCG tablet Take 112 mcg by mouth daily before breakfast.      . medroxyPROGESTERone (PROVERA) 10 MG tablet Take 2 tablets (20 mg total) by mouth 3 (three) times daily. For one week.  42 tablet  0   No current facility-administered medications for this visit.    Review of Systems Review of Systems Constitutional: negative for fatigue and weight loss Respiratory: negative for cough and wheezing Cardiovascular: negative for chest pain, fatigue and palpitations Gastrointestinal: negative for abdominal pain and change in bowel habits Genitourinary:positive for abnormal uterine bleeding Integument/breast: negative for nipple discharge Musculoskeletal:negative for myalgias Neurological: negative for gait problems and tremors Behavioral/Psych: negative for abusive relationship, depression Endocrine: negative for temperature intolerance     Blood pressure 133/91, pulse 76, temperature 97.8 F (36.6 C), height 5' (1.524 m), weight 60.328 kg (133 lb), last menstrual period  04/09/2014.  Physical Exam Physical Exam General:   alert  Skin:   no rash or abnormalities  Lungs:   clear to auscultation bilaterally  Heart:   regular rate and rhythm, S1, S2 normal, no murmur, click, rub or gallop  Breasts:   normal without suspicious masses, skin or nipple changes or axillary nodes  Abdomen:  normal findings: no organomegaly, soft, non-tender and no hernia  Pelvis:  External genitalia: normal general appearance Urinary system: urethral meatus normal and bladder without fullness, nontender Vaginal:heme present Cervix: normal appearance Adnexa: normal bimanual exam Uterus: anteverted and non-tender, normal size       Data Reviewed UPT  Assessment    AUB--O;  Prolonged episode    Plan Hgb/Hct Screen for STI   High dose oral Provera x 1 week Possible management options include: medical management with cyclic Provera or OCP Follow up as needed.         JACKSON-MOORE,Corin Tilly A 08/03/2014, 1:58 PM

## 2014-09-30 ENCOUNTER — Encounter: Payer: Self-pay | Admitting: Obstetrics & Gynecology

## 2014-11-25 ENCOUNTER — Encounter: Payer: Self-pay | Admitting: *Deleted

## 2014-11-26 ENCOUNTER — Encounter: Payer: Self-pay | Admitting: Obstetrics & Gynecology

## 2014-12-26 ENCOUNTER — Ambulatory Visit (INDEPENDENT_AMBULATORY_CARE_PROVIDER_SITE_OTHER): Payer: Self-pay | Admitting: Family Medicine

## 2014-12-26 VITALS — BP 118/70 | HR 84 | Temp 98.3°F | Resp 18 | Ht 59.0 in | Wt 130.0 lb

## 2014-12-26 DIAGNOSIS — R062 Wheezing: Secondary | ICD-10-CM

## 2014-12-26 DIAGNOSIS — J209 Acute bronchitis, unspecified: Secondary | ICD-10-CM

## 2014-12-26 MED ORDER — HYDROCODONE-HOMATROPINE 5-1.5 MG/5ML PO SYRP: 5.0000 mL | ORAL_SOLUTION | ORAL | Status: DC | PRN

## 2014-12-26 MED ORDER — AMOXICILLIN 875 MG PO TABS: 875.0000 mg | ORAL_TABLET | Freq: Two times a day (BID) | ORAL | Status: DC

## 2014-12-26 MED ORDER — ALBUTEROL SULFATE HFA 108 (90 BASE) MCG/ACT IN AERS: 2.0000 | INHALATION_SPRAY | RESPIRATORY_TRACT | Status: DC | PRN

## 2014-12-26 NOTE — Patient Instructions (Signed)
Drink plenty of fluids and try and get enough rest  Take the amoxicillin one twice daily  Take the cough syrup one half to one teaspoon every 4-6 hours if needed for cough. It may make your little drowsy.  Use the albuterol 2 inhalations every 4-6 hours as needed for coughing and tightness in chest  Return if worsen any time

## 2014-12-26 NOTE — Progress Notes (Signed)
Subjective: 39 year old lady who has had a respiratory tract infection for about a week and a half. It started with a cold, then settled into a cough. The cough seems to be persisting. She has some tightness in her chest. She coughs up some yellowish mucus. She does not smoke. She has a cough day and night. Last menstrual cycle was January 5. She's not pregnant.  Objective: Pleasant alert lady in no major distress. Left tympanosclerosis. Right TM normal. Throat clear. Neck supple without significant nodes. Chest has some minimal wheezing. Heart regular without murmurs.  Assessment: Bronchitis Mild wheezing  Plan: Amoxicillin Hycodan Albuterol

## 2015-01-03 ENCOUNTER — Emergency Department (INDEPENDENT_AMBULATORY_CARE_PROVIDER_SITE_OTHER)
Admission: EM | Admit: 2015-01-03 | Discharge: 2015-01-03 | Disposition: A | Discharge status: Home / Self Care | Payer: Self-pay | Source: Home / Self Care | Attending: Emergency Medicine | Admitting: Emergency Medicine

## 2015-01-03 ENCOUNTER — Encounter (HOSPITAL_COMMUNITY): Payer: Self-pay | Admitting: Emergency Medicine

## 2015-01-03 ENCOUNTER — Emergency Department (INDEPENDENT_AMBULATORY_CARE_PROVIDER_SITE_OTHER): Payer: Self-pay

## 2015-01-03 DIAGNOSIS — R05 Cough: Secondary | ICD-10-CM

## 2015-01-03 DIAGNOSIS — R059 Cough, unspecified: Secondary | ICD-10-CM

## 2015-01-03 DIAGNOSIS — J209 Acute bronchitis, unspecified: Secondary | ICD-10-CM

## 2015-01-03 MED ORDER — GUAIFENESIN-CODEINE 100-10 MG/5ML PO SYRP: 5.0000 mL | ORAL_SOLUTION | Freq: Three times a day (TID) | ORAL | Status: DC | PRN

## 2015-01-03 MED ORDER — PREDNISONE 20 MG PO TABS: ORAL_TABLET | ORAL | Status: DC

## 2015-01-03 MED ORDER — ALBUTEROL SULFATE HFA 108 (90 BASE) MCG/ACT IN AERS: 1.0000 | INHALATION_SPRAY | Freq: Four times a day (QID) | RESPIRATORY_TRACT | Status: DC | PRN

## 2015-01-03 NOTE — ED Notes (Signed)
Pt was diagnosed with bronchitis at another [facility] and she was put on antibiotics.  She has two more days left but is not feeling any better.  She states the cough has decreased some, but that she still is suffering from a headache, nasal congestion, and chest pain from the coughing.

## 2015-01-03 NOTE — ED Provider Notes (Signed)
Chief Complaint   Bronchitis   History of Present Illness   Teresa Michael is a 39 year old female who has had a three-week history of cough productive yellow sputum, chest tightness, wheezing, and chest pain. She's also had nasal congestion with small amounts of clear drainage, headache, any ear congestion. She denies fevers, chills, sweats, or muscle aches. She's had no sore throat. No GI symptoms. She went to [place] urgent care when symptoms first began. She was diagnosed with bronchitis and given amoxicillin, and inhaler, and cough syrup. She feels a little bit better, but still not well.  Review of Systems   Other than as noted above, the patient denies any of the following symptoms: Systemic:  No fevers, chills, sweats, or myalgias. Eye:  No redness or discharge. ENT:  No ear pain, headache, nasal congestion, drainage, sinus pressure, or sore throat. Neck:  No neck pain, stiffness, or swollen glands. Lungs:  No cough, sputum production, hemoptysis, wheezing, chest tightness, shortness of breath or chest pain. GI:  No abdominal pain, nausea, vomiting or diarrhea.  PMFSH   Past medical history, family history, social history, meds, and allergies were reviewed. She has hypothyroidism and takes levothyroxine.  Physical exam   Vital signs:  BP 158/96 mmHg  Pulse 74  Temp(Src) 97.8 F (36.6 C) (Oral)  Resp 16  SpO2 100%  LMP 12/28/2014 (Exact Date) General:  Alert and oriented.  In no distress.  Skin warm and dry. Eye:  No conjunctival injection or drainage. Lids were normal. ENT:  TMs and canals were normal, without erythema or inflammation.  Nasal mucosa was clear and uncongested, without drainage.  Mucous membranes were moist.  Pharynx was clear with no exudate or drainage.  There were no oral ulcerations or lesions. Neck:  Supple, no adenopathy, tenderness or mass. Lungs:  No respiratory distress.  Lungs were clear to auscultation, without wheezes, rales or rhonchi.  Breath  sounds were clear and equal bilaterally.  Heart:  Regular rhythm, without gallops, murmers or rubs. Skin:  Clear, warm, and dry, without rash or lesions.  Radiology   Dg Chest 2 View  01/03/2015   CLINICAL DATA:  Cough, congestion  EXAM: CHEST  2 VIEW  COMPARISON:  None.  FINDINGS: Lungs are clear.  No pleural effusion or pneumothorax.  The heart is normal in size.  Visualized osseous structures are within normal limits.  IMPRESSION: Normal chest radiographs.   Electronically Signed   By: Charline Bills M.D.   On: 01/03/2015 20:53   Assessment     The primary encounter diagnosis was Acute bronchitis, unspecified organism. A diagnosis of Cough was also pertinent to this visit.  There is no evidence of pneumonia, strep throat, sinusitis, otitis media.    Plan    1.  Meds:  The following meds were prescribed:   New Prescriptions   ALBUTEROL (PROVENTIL HFA;VENTOLIN HFA) 108 (90 BASE) MCG/ACT INHALER    Inhale 1-2 puffs into the lungs every 6 (six) hours as needed for wheezing or shortness of breath.   GUAIFENESIN-CODEINE (ROBITUSSIN AC) 100-10 MG/5ML SYRUP    Take 5 mLs by mouth 3 (three) times daily as needed for cough.   PREDNISONE (DELTASONE) 20 MG TABLET    Take 3 daily for 5 days, 2 daily for 5 days, 1 daily for 5 days.    2.  Patient Education/Counseling:  The patient was given appropriate handouts, self care instructions, and instructed in symptomatic relief.  Instructed to get extra fluids and extra rest.  3.  Follow up:  The patient was told to follow up here if no better in 3 to 4 days, or sooner if becoming worse in any way, and given some red flag symptoms such as increasing fever, difficulty breathing, chest pain, or persistent vomiting which would prompt immediate return.       Reuben Likesavid C Pepper Kerrick, MD 01/03/15 2107

## 2015-01-03 NOTE — Discharge Instructions (Signed)

## 2015-06-05 ENCOUNTER — Ambulatory Visit (INDEPENDENT_AMBULATORY_CARE_PROVIDER_SITE_OTHER): Payer: Self-pay | Admitting: Family Medicine

## 2015-06-05 VITALS — BP 152/98 | HR 74 | Temp 97.7°F | Resp 15 | Ht 59.25 in | Wt 127.0 lb

## 2015-06-05 DIAGNOSIS — Z0000 Encounter for general adult medical examination without abnormal findings: Secondary | ICD-10-CM

## 2015-06-05 DIAGNOSIS — N6320 Unspecified lump in the left breast, unspecified quadrant: Secondary | ICD-10-CM

## 2015-06-05 DIAGNOSIS — Z13220 Encounter for screening for lipoid disorders: Secondary | ICD-10-CM

## 2015-06-05 DIAGNOSIS — N898 Other specified noninflammatory disorders of vagina: Secondary | ICD-10-CM

## 2015-06-05 DIAGNOSIS — Z130 Encounter for screening for diseases of the blood and blood-forming organs and certain disorders involving the immune mechanism: Secondary | ICD-10-CM

## 2015-06-05 DIAGNOSIS — N63 Unspecified lump in breast: Secondary | ICD-10-CM

## 2015-06-05 DIAGNOSIS — Z113 Encounter for screening for infections with a predominantly sexual mode of transmission: Secondary | ICD-10-CM

## 2015-06-05 DIAGNOSIS — N390 Urinary tract infection, site not specified: Secondary | ICD-10-CM

## 2015-06-05 LAB — CBC
HCT: 38.1 % (ref 36.0–46.0)
Hemoglobin: 12.1 g/dL (ref 12.0–15.0)
MCH: 22.9 pg — ABNORMAL LOW (ref 26.0–34.0)
MCHC: 31.8 g/dL (ref 30.0–36.0)
MCV: 72.2 fL — ABNORMAL LOW (ref 78.0–100.0)
MPV: 9.3 fL (ref 8.6–12.4)
Platelets: 355 10*3/uL (ref 150–400)
RBC: 5.28 MIL/uL — ABNORMAL HIGH (ref 3.87–5.11)
RDW: 16.6 % — ABNORMAL HIGH (ref 11.5–15.5)
WBC: 5.2 10*3/uL (ref 4.0–10.5)

## 2015-06-05 LAB — POCT UA - MICROSCOPIC ONLY
Casts, Ur, LPF, POC: NEGATIVE
Crystals, Ur, HPF, POC: NEGATIVE
Mucus, UA: NEGATIVE
Yeast, UA: NEGATIVE

## 2015-06-05 LAB — POCT URINALYSIS DIPSTICK
Bilirubin, UA: NEGATIVE
Glucose, UA: NEGATIVE
Ketones, UA: NEGATIVE
Nitrite, UA: NEGATIVE
Protein, UA: NEGATIVE
Spec Grav, UA: 1.02
Urobilinogen, UA: 0.2
pH, UA: 7.5

## 2015-06-05 LAB — COMPLETE METABOLIC PANEL WITH GFR
AST: 48 U/L — ABNORMAL HIGH (ref 0–37)
Alkaline Phosphatase: 124 U/L — ABNORMAL HIGH (ref 39–117)
BUN: 16 mg/dL (ref 6–23)
CO2: 27 mEq/L (ref 19–32)
Creat: 0.81 mg/dL (ref 0.50–1.10)
GFR, Est Non African American: >89 mL/min
Glucose, Bld: 93 mg/dL (ref 70–99)
Sodium: 139 mEq/L (ref 135–145)
Total Bilirubin: 0.4 mg/dL (ref 0.2–1.2)

## 2015-06-05 LAB — LIPID PANEL
Cholesterol: 236 mg/dL — ABNORMAL HIGH (ref 0–200)
HDL: 72 mg/dL (ref >=46)
LDL Cholesterol: 139 mg/dL — ABNORMAL HIGH (ref 0–99)
Total CHOL/HDL Ratio: 3.3 Ratio
Triglycerides: 127 mg/dL (ref <150)
VLDL: 25 mg/dL (ref 0–40)

## 2015-06-05 LAB — HIV ANTIBODY (ROUTINE TESTING W REFLEX): HIV 1&2 Ab, 4th Generation: NONREACTIVE

## 2015-06-05 LAB — POCT WET PREP WITH KOH
Clue Cells Wet Prep HPF POC: NEGATIVE
KOH Prep POC: NEGATIVE
Trichomonas, UA: NEGATIVE
Yeast Wet Prep HPF POC: NEGATIVE

## 2015-06-05 LAB — COMPLETE METABOLIC PANEL WITHOUT GFR
ALT: 76 U/L — ABNORMAL HIGH (ref 0–35)
Albumin: 4.6 g/dL (ref 3.5–5.2)
Calcium: 9.4 mg/dL (ref 8.4–10.5)
Chloride: 100 meq/L (ref 96–112)
GFR, Est African American: >89 mL/min
Potassium: 4.4 meq/L (ref 3.5–5.3)
Total Protein: 7.4 g/dL (ref 6.0–8.3)

## 2015-06-05 MED ORDER — CIPROFLOXACIN HCL 250 MG PO TABS: 250.0000 mg | ORAL_TABLET | Freq: Two times a day (BID) | ORAL | Status: DC

## 2015-06-05 MED ORDER — METRONIDAZOLE 500 MG PO TABS: 500.0000 mg | ORAL_TABLET | Freq: Two times a day (BID) | ORAL | Status: DC

## 2015-06-05 NOTE — Patient Instructions (Signed)
Urinary Tract Infection Urinary tract infections (UTIs) can develop anywhere along your urinary tract. Your urinary tract is your body's drainage system for removing wastes and extra water. Your urinary tract includes two kidneys, two ureters, a bladder, and a urethra. Your kidneys are a pair of bean-shaped organs. Each kidney is about the size of your fist. They are located below your ribs, one on each side of your spine. CAUSES Infections are caused by microbes, which are microscopic organisms, including fungi, viruses, and bacteria. These organisms are so small that they can only be seen through a microscope. Bacteria are the microbes that most commonly cause UTIs. SYMPTOMS  Symptoms of UTIs may vary by age and gender of the patient and by the location of the infection. Symptoms in young women typically include a frequent and intense urge to urinate and a painful, burning feeling in the bladder or urethra during urination. Older women and men are more likely to be tired, shaky, and weak and have muscle aches and abdominal pain. A fever may mean the infection is in your kidneys. Other symptoms of a kidney infection include pain in your back or sides below the ribs, nausea, and vomiting. DIAGNOSIS To diagnose a UTI, your caregiver will ask you about your symptoms. Your caregiver also will ask to provide a urine sample. The urine sample will be tested for bacteria and white blood cells. White blood cells are made by your body to help fight infection. TREATMENT  Typically, UTIs can be treated with medication. Because most UTIs are caused by a bacterial infection, they usually can be treated with the use of antibiotics. The choice of antibiotic and length of treatment depend on your symptoms and the type of bacteria causing your infection. HOME CARE INSTRUCTIONS  If you were prescribed antibiotics, take them exactly as your caregiver instructs you. Finish the medication even if you feel better after you  have only taken some of the medication.  Drink enough water and fluids to keep your urine clear or pale yellow.  Avoid caffeine, tea, and carbonated beverages. They tend to irritate your bladder.  Empty your bladder often. Avoid holding urine for long periods of time.  Empty your bladder before and after sexual intercourse.  After a bowel movement, women should cleanse from front to back. Use each tissue only once. SEEK MEDICAL CARE IF:   You have back pain.  You develop a fever.  Your symptoms do not begin to resolve within 3 days. SEEK IMMEDIATE MEDICAL CARE IF:   You have severe back pain or lower abdominal pain.  You develop chills.  You have nausea or vomiting.  You have continued burning or discomfort with urination. MAKE SURE YOU:   Understand these instructions.  Will watch your condition.  Will get help right away if you are not doing well or get worse. Document Released: 08/25/2005 Document Revised: 05/16/2012 Document Reviewed: 12/24/2011 ExitCare Patient Information 2015 [employer]. This information is not intended to replace advice given to you by your health care provider. Make sure you discuss any questions you have with your health care provider. Bacterial Vaginosis Bacterial vaginosis is an infection of the vagina. It happens when too many of certain germs (bacteria) grow in the vagina. HOME CARE  Take your medicine as told by your doctor.  Finish your medicine even if you start to feel better.  Do not have sex until you finish your medicine and are better.  Tell your sex partner that you have an   infection. They should see their doctor for treatment.  Practice safe sex. Use condoms. Have only one sex partner. GET HELP IF:  You are not getting better after 3 days of treatment.  You have more grey fluid (discharge) coming from your vagina than before.  You have more pain than before.  You have a fever. MAKE SURE YOU:   Understand these  instructions.  Will watch your condition.  Will get help right away if you are not doing well or get worse. Document Released: 08/24/2008 Document Revised: 09/05/2013 Document Reviewed: 06/27/2013 [facility] Patient Information 2015 [employer], [place]. This information is not intended to replace advice given to you by your health care provider. Make sure you discuss any questions you have with your health care provider.

## 2015-06-05 NOTE — Progress Notes (Signed)
Chief Complaint:  Chief Complaint  Patient presents with  . Annual Exam    HPI: Teresa Michael is a 39 y.o. female who is here for   Annual exam.  Last Pap smear in January 15th 2014  And it was negative for malignancy.  she has thyroid disease and being is being followed by a specialist.  No complaints except for  Vaginal discharge and left breast mass. She says that she gets it after her cycles and with sex. It is yellow and thick. No fevers or chills. No dysuria.   Also has a left breast mass that has been present for the last 3  Weeks. It is tender and sometimes burns. There's been no skin changes or nipple inversion or discharge. There is no family history of ovarian breast or uterine or cervical cancer. She states it usually moves with cycles but not this time.   She is G2 L2  Last menstrual cycle was 05/24/1999 16.  She is UTD On TDap--When she was pregnant which was 4 years ago.  Is up-to-date on her flu vaccine.  Past Medical History  Diagnosis Date  . Thyroid disease    Past Surgical History  Procedure Laterality Date  . Cesarean section     History   Social History  . Marital Status: Married    Spouse Name: N/A  . Number of Children: N/A  . Years of Education: 12   Occupational History  . Book Biomedical engineerkeeper    Social History Main Topics  . Smoking status: Never Smoker   . Smokeless tobacco: Not on file  . Alcohol Use: Yes     Comment: special occasions  . Drug Use: No  . Sexual Activity:    Partners: Male    Birth Control/ Protection: Condom   Other Topics Concern  . None   Social History Narrative   Pt. Exercises regularly   Family History  Problem Relation Age of Onset  . Depression Mother   . Hypertension Mother   . Hypertension Father   . Hyperlipidemia Father   . Depression Brother   . Hypertension Maternal Grandfather    No Known Allergies Prior to Admission medications   Medication Sig Start Date End Date Taking? Authorizing Provider    albuterol (PROVENTIL HFA;VENTOLIN HFA) 108 (90 BASE) MCG/ACT inhaler Inhale 1-2 puffs into the lungs every 6 (six) hours as needed for wheezing or shortness of breath. 01/03/15  Yes Reuben Likesavid C Keller, MD  levothyroxine (SYNTHROID, LEVOTHROID) 112 MCG tablet Take 112 mcg by mouth daily before breakfast.   Yes Historical Provider, MD     ROS: The patient denies fevers, chills, night sweats, unintentional weight loss, chest pain, palpitations, wheezing, dyspnea on exertion, nausea, vomiting, abdominal pain, dysuria, hematuria, melena, numbness, weakness, or tingling.   All other systems have been reviewed and were otherwise negative with the exception of those mentioned in the HPI and as above.    PHYSICAL EXAM: Filed Vitals:   06/05/15 1154  BP: 152/98  Pulse:   Temp:   Resp:    Filed Vitals:   06/05/15 1103  Height: 4' 11.25" (1.505 m)  Weight: 127 lb (57.607 kg)   Body mass index is 25.43 kg/(m^2).   General: Alert, no acute distress HEENT:  Normocephalic, atraumatic, oropharynx patent. EOMI, PERRLA Cardiovascular:  Regular rate and rhythm, no rubs murmurs or gallops.  No Carotid bruits, radial pulse intact. No pedal edema.  Respiratory: Clear to auscultation bilaterally.  No wheezes,  rales, or rhonchi.  No cyanosis, no use of accessory musculature GI: No organomegaly, abdomen is soft and non-tender, positive bowel sounds.  No masses. Skin: No rashes. Neurologic: Facial musculature symmetric. Psychiatric: Patient is appropriate throughout our interaction. Lymphatic: No cervical lymphadenopathy Musculoskeletal: Gait intact. Positive left breast mass mobile, tender  @ 11-12:00. Copious dc nonodorous, no masses, no CMT,   LABS: Results for orders placed or performed in visit on 06/05/15  POCT UA - Microscopic Only  Result Value Ref Range   WBC, Ur, HPF, POC 0-1    RBC, urine, microscopic 0-1    Bacteria, U Microscopic trace    Mucus, UA neg    Epithelial cells, urine per  micros 0-1    Crystals, Ur, HPF, POC neg    Casts, Ur, LPF, POC neg    Yeast, UA neg   POCT urinalysis dipstick  Result Value Ref Range   Color, UA yellow    Clarity, UA clear    Glucose, UA neg    Bilirubin, UA neg    Ketones, UA neg    Spec Grav, UA 1.020    Blood, UA trace-lysed    pH, UA 7.5    Protein, UA neg    Urobilinogen, UA 0.2    Nitrite, UA neg    Leukocytes, UA Trace (A) Negative  POCT Wet Prep with KOH  Result Value Ref Range   Trichomonas, UA Negative    Clue Cells Wet Prep HPF POC neg    Epithelial Wet Prep HPF POC Few Few, Moderate, Many   Yeast Wet Prep HPF POC neg    Bacteria Wet Prep HPF POC Few Few   RBC Wet Prep HPF POC 0-2    WBC Wet Prep HPF POC 8-13    KOH Prep POC Negative      EKG/XRAY:   Primary read interpreted by Dr. Conley Rolls at [facility].   ASSESSMENT/PLAN: Encounter Diagnoses  Name Primary?  . Annual physical exam Yes  . Screening for deficiency anemia   . Screening for hyperlipidemia   . Breast mass, left   . Screening for STD (sexually transmitted disease)    I will go ahead and presumptively treat her for a UTI with Cipro 500 mg  Presumptively treat her for bacterial vaginosis as well sinc vaginal  discharge was fairly thick. Flagyl 500  mg by mouth twice a day  Annual labs pending Mammogram, will do a diagnostic if needed Fu prn    Gross sideeffects, risk and benefits, and alternatives of medications d/w patient. Patient is aware that all medications have potential sideeffects and we are unable to predict every sideeffect or drug-drug interaction that may occur.  Dr. Rachelle Hora Conley Rolls 06/05/2015 12:27 PM

## 2015-06-06 ENCOUNTER — Other Ambulatory Visit: Payer: Self-pay | Admitting: Family Medicine

## 2015-06-06 DIAGNOSIS — N6320 Unspecified lump in the left breast, unspecified quadrant: Secondary | ICD-10-CM

## 2015-06-06 LAB — GC/CHLAMYDIA PROBE AMP
CT Probe RNA: NEGATIVE
GC Probe RNA: NEGATIVE

## 2015-06-06 LAB — RPR

## 2015-06-11 ENCOUNTER — Other Ambulatory Visit: Payer: Self-pay | Admitting: Family Medicine

## 2015-06-11 ENCOUNTER — Other Ambulatory Visit: Payer: Self-pay

## 2015-06-11 ENCOUNTER — Ambulatory Visit
Admission: RE | Admit: 2015-06-11 | Discharge: 2015-06-11 | Disposition: A | Discharge status: Home / Self Care | Payer: No Typology Code available for payment source | Source: Ambulatory Visit | Attending: Family Medicine | Admitting: Family Medicine

## 2015-06-11 DIAGNOSIS — N6320 Unspecified lump in the left breast, unspecified quadrant: Secondary | ICD-10-CM

## 2015-07-31 ENCOUNTER — Telehealth: Payer: Self-pay | Admitting: Family Medicine

## 2015-07-31 ENCOUNTER — Other Ambulatory Visit: Payer: Self-pay | Admitting: Family Medicine

## 2015-07-31 ENCOUNTER — Encounter: Payer: Self-pay | Admitting: Family Medicine

## 2015-07-31 DIAGNOSIS — R748 Abnormal levels of other serum enzymes: Secondary | ICD-10-CM

## 2015-07-31 NOTE — Telephone Encounter (Signed)
She will come in for repeat CMP, she is UTD on hepatitis vaccines, no jaundice, changes n stool , tylenol use, or excessive alcohol. NO hx of hepatitis infection.

## 2015-08-26 ENCOUNTER — Other Ambulatory Visit (INDEPENDENT_AMBULATORY_CARE_PROVIDER_SITE_OTHER): Payer: Self-pay

## 2015-08-26 DIAGNOSIS — R748 Abnormal levels of other serum enzymes: Secondary | ICD-10-CM

## 2015-08-26 LAB — COMPLETE METABOLIC PANEL WITH GFR
ALT: 46 U/L — ABNORMAL HIGH (ref 6–29)
AST: 30 U/L (ref 10–30)
Albumin: 4.4 g/dL (ref 3.6–5.1)
Alkaline Phosphatase: 82 U/L (ref 33–115)
BUN: 14 mg/dL (ref 7–25)
CO2: 23 mmol/L (ref 20–31)
Calcium: 9.4 mg/dL (ref 8.6–10.2)
Chloride: 106 mmol/L (ref 98–110)
Creat: 0.74 mg/dL (ref 0.50–1.10)
Total Protein: 7.3 g/dL (ref 6.1–8.1)

## 2015-08-26 LAB — COMPLETE METABOLIC PANEL WITHOUT GFR
GFR, Est African American: >89 mL/min (ref >=60)
GFR, Est Non African American: >89 mL/min (ref >=60)
Glucose, Bld: 101 mg/dL — ABNORMAL HIGH (ref 65–99)
Potassium: 4.2 mmol/L (ref 3.5–5.3)
Sodium: 139 mmol/L (ref 135–146)
Total Bilirubin: 0.4 mg/dL (ref 0.2–1.2)

## 2015-12-22 ENCOUNTER — Other Ambulatory Visit: Payer: Self-pay | Admitting: Family Medicine

## 2015-12-22 ENCOUNTER — Ambulatory Visit (INDEPENDENT_AMBULATORY_CARE_PROVIDER_SITE_OTHER): Payer: Self-pay | Admitting: Family Medicine

## 2015-12-22 VITALS — BP 140/90 | HR 79 | Temp 98.1°F | Resp 16 | Ht 59.25 in | Wt 133.0 lb

## 2015-12-22 DIAGNOSIS — N898 Other specified noninflammatory disorders of vagina: Secondary | ICD-10-CM

## 2015-12-22 DIAGNOSIS — A499 Bacterial infection, unspecified: Secondary | ICD-10-CM

## 2015-12-22 DIAGNOSIS — N760 Acute vaginitis: Secondary | ICD-10-CM

## 2015-12-22 DIAGNOSIS — I10 Essential (primary) hypertension: Secondary | ICD-10-CM

## 2015-12-22 DIAGNOSIS — R748 Abnormal levels of other serum enzymes: Secondary | ICD-10-CM

## 2015-12-22 DIAGNOSIS — N390 Urinary tract infection, site not specified: Secondary | ICD-10-CM

## 2015-12-22 DIAGNOSIS — B9689 Other specified bacterial agents as the cause of diseases classified elsewhere: Secondary | ICD-10-CM

## 2015-12-22 LAB — COMPLETE METABOLIC PANEL WITHOUT GFR
Albumin: 4.7 g/dL (ref 3.6–5.1)
Alkaline Phosphatase: 155 U/L — ABNORMAL HIGH (ref 33–115)
Calcium: 9.8 mg/dL (ref 8.6–10.2)
GFR, Est African American: >89 mL/min (ref >=60)
GFR, Est Non African American: >89 mL/min (ref >=60)
Potassium: 4.4 mmol/L (ref 3.5–5.3)
Total Bilirubin: 0.4 mg/dL (ref 0.2–1.2)
Total Protein: 7.5 g/dL (ref 6.1–8.1)

## 2015-12-22 LAB — POCT WET + KOH PREP: Trich by wet prep: ABSENT

## 2015-12-22 LAB — POCT URINALYSIS DIP (MANUAL ENTRY)
Bilirubin, UA: NEGATIVE
Blood, UA: NEGATIVE
Glucose, UA: NEGATIVE
Ketones, POC UA: NEGATIVE
Nitrite, UA: NEGATIVE
Protein Ur, POC: NEGATIVE
Spec Grav, UA: <=1.005
Urobilinogen, UA: 0.2
pH, UA: 6

## 2015-12-22 LAB — COMPLETE METABOLIC PANEL WITH GFR
ALT: 94 U/L — ABNORMAL HIGH (ref 6–29)
AST: 30 U/L (ref 10–30)
BUN: 12 mg/dL (ref 7–25)
CO2: 25 mmol/L (ref 20–31)
Chloride: 102 mmol/L (ref 98–110)
Creat: 0.74 mg/dL (ref 0.50–1.10)
Glucose, Bld: 95 mg/dL (ref 65–99)
Sodium: 138 mmol/L (ref 135–146)

## 2015-12-22 LAB — POCT URINE PREGNANCY: Preg Test, Ur: NEGATIVE

## 2015-12-22 MED ORDER — CEPHALEXIN 500 MG PO CAPS: 500.0000 mg | ORAL_CAPSULE | Freq: Two times a day (BID) | ORAL | Status: DC

## 2015-12-22 MED ORDER — METOPROLOL SUCCINATE ER 25 MG PO TB24: 25.0000 mg | ORAL_TABLET | Freq: Every day | ORAL | Status: DC

## 2015-12-22 MED ORDER — FLUCONAZOLE 150 MG PO TABS: 150.0000 mg | ORAL_TABLET | Freq: Once | ORAL | Status: DC

## 2015-12-22 MED ORDER — METRONIDAZOLE 500 MG PO TABS: 500.0000 mg | ORAL_TABLET | Freq: Two times a day (BID) | ORAL | Status: DC

## 2015-12-22 NOTE — Progress Notes (Signed)
Chief Complaint:  Chief Complaint  Patient presents with  . Flu Vaccine  . Vaginal Discharge    believes she has an vaginal infection;  white discharge; states she is having more discharge than normal; no odor  . Vaginal Itching    has itching on labia  . Vaginal Pain    on the left outer side    HPI: Teresa Michael is a 40 y.o. female who reports to [facility] today complaining of  Vaginal dc for last 1 week, she has had itching and also more dc, She was taking vagisil without relief, she took it for less than 1 week and felt better and then came back + left sided back pain. No dysuria. No increase frequecny. No n/v/abd pain except that she had it 1 week ago, she thinks she had a stomach virus, she has 1 partner, does not use condoms all th time.  She is not trying to get pregnant but does not use OCP, has had problems with OCP and also headaches with injections. Has not tried IUDs.   Last pap and also STD screening 6 months to 1 year ago was normal  She had a hx of HTN due to poorly controlled thyroid disease, sees Dr Talmage Nap for thyroid issues and once thyroid controlled then she was told to get off metoprolol. She feels her BP has been up but does not remember how high it goes up, she has had some headache with it and so would like to be put back on HTN meds.  BP Readings from Last 3 Encounters:  12/22/15 140/90  06/05/15 152/98  01/03/15 158/96    Past Medical History  Diagnosis Date  . Thyroid disease    Past Surgical History  Procedure Laterality Date  . Cesarean section     Social History   Social History  . Marital Status: Married    Spouse Name: N/A  . Number of Children: N/A  . Years of Education: 12   Occupational History  . Book Biomedical engineer    Social History Main Topics  . Smoking status: Never Smoker   . Smokeless tobacco: None  . Alcohol Use: Yes     Comment: special occasions  . Drug Use: No  . Sexual Activity:    Partners: Male    Birth Control/  Protection: Condom   Other Topics Concern  . None   Social History Narrative   Pt. Exercises regularly   Family History  Problem Relation Age of Onset  . Depression Mother   . Hypertension Mother   . Hypertension Father   . Hyperlipidemia Father   . Depression Brother   . Hypertension Maternal Grandfather    No Known Allergies Prior to Admission medications   Medication Sig Start Date End Date Taking? Authorizing Provider  levothyroxine (SYNTHROID, LEVOTHROID) 112 MCG tablet Take 112 mcg by mouth daily before breakfast.   Yes Historical Provider, MD     ROS: The patient denies fevers, chills, night sweats, unintentional weight loss, chest pain, palpitations, wheezing, dyspnea on exertion, nausea, vomiting, abdominal pain, dysuria, hematuria, melena, numbness, weakness, or tingling.  All other systems have been reviewed and were otherwise negative with the exception of those mentioned in the HPI and as above.    PHYSICAL EXAM: Filed Vitals:   12/22/15 1050  BP: 140/90  Pulse: 79  Temp: 98.1 F (36.7 C)  Resp: 16   Body mass index is 26.63 kg/(m^2).   General: Alert, no acute  distress HEENT:  Normocephalic, atraumatic, oropharynx patent. EOMI, PERRLA. Fundo exam , Tm normal  Cardiovascular:  Regular rate and rhythm, no rubs murmurs or gallops.  No Carotid bruits, radial pulse intact. No pedal edema.  Respiratory: Clear to auscultation bilaterally.  No wheezes, rales, or rhonchi.  No cyanosis, no use of accessory musculature Abdominal: No organomegaly, abdomen is soft and non-tender, positive bowel sounds. No masses. Skin: No rashes. Neurologic: Facial musculature symmetric. Psychiatric: Patient acts appropriately throughout our interaction. Lymphatic: No cervical or submandibular lymphadenopathy Musculoskeletal: Gait intact. No edema, tenderness Gu-neg CMT, no massess, no rashes, + DC, neg CVa tenderness   LABS: Results for orders placed or performed in visit on  12/22/15  POCT urinalysis dipstick  Result Value Ref Range   Color, UA yellow yellow   Clarity, UA clear clear   Glucose, UA negative negative   Bilirubin, UA negative negative   Ketones, POC UA negative negative   Spec Grav, UA <=1.005    Blood, UA negative negative   pH, UA 6.0    Protein Ur, POC negative negative   Urobilinogen, UA 0.2    Nitrite, UA Negative Negative   Leukocytes, UA moderate (2+) (A) Negative  POCT Wet + KOH Prep  Result Value Ref Range   Yeast by KOH Present Present, Absent   Yeast by wet prep Present Present, Absent   WBC by wet prep Too numerous to count  None, Few, Too numerous to count   Clue Cells Wet Prep HPF POC Few (A) None, Too numerous to count   Trich by wet prep Absent Present, Absent   Bacteria Wet Prep HPF POC Many (A) None, Few, Too numerous to count   Epithelial Cells By [employer] ([facility]) Many (A) None, Few, Too numerous to count   RBC,UR,HPF,POC None None RBC/hpf  POCT urine pregnancy  Result Value Ref Range   Preg Test, Ur Negative Negative     EKG/XRAY:   Primary read interpreted by Dr. Conley Rolls at [facility].   ASSESSMENT/PLAN: Encounter Diagnoses  Name Primary?  . Vaginal discharge   . Abnormal liver enzymes   . UTI (urinary tract infection), bacterial   . Bacterial vaginosis Yes  . Vaginitis and vulvovaginitis   . Essential hypertension    Urine cx pending Rx Keflex 500 mg BID x 3 days, Rx Flagyl Rx diflucan if have yeast infection Monitor BP at home on  CMP pending for prior abnormal liver enzymes She will restart metoprlol and monitor BP, BPs have been high in the past here. Will recheck with Dr Talmage Nap when she goes and sees her for hyperthyroid Fu prn   Gross sideeffects, risk and benefits, and alternatives of medications d/w patient. Patient is aware that all medications have potential sideeffects and we are unable to predict every sideeffect or drug-drug interaction that may occur.  Thao Le DO  12/22/2015 2:48 PM

## 2015-12-23 ENCOUNTER — Telehealth: Payer: Self-pay | Admitting: Family Medicine

## 2015-12-23 ENCOUNTER — Telehealth: Payer: Self-pay

## 2015-12-23 DIAGNOSIS — R748 Abnormal levels of other serum enzymes: Secondary | ICD-10-CM

## 2015-12-23 LAB — URINE CULTURE
Colony Count: NO GROWTH
Organism ID, Bacteria: NO GROWTH

## 2015-12-23 LAB — GC/CHLAMYDIA PROBE AMP
CT Probe RNA: NOT DETECTED
GC Probe RNA: NOT DETECTED

## 2015-12-23 NOTE — Telephone Encounter (Signed)
-----   Message from Lenell Antu, DO sent at 12/23/2015  3:20 PM EST ----- Regarding: abnormal liver enzymes  Can you add an acute hepatitis panel to this patient's prior blood work.  Hep A, B and C  Same dx as abnormal liver enzymes, She has agreed for add on tests.   Thanks   Dr Conley Rolls

## 2015-12-23 NOTE — Telephone Encounter (Signed)
LM to call me back about labs.  

## 2015-12-23 NOTE — Telephone Encounter (Signed)
Test added.   

## 2015-12-24 LAB — HEPATITIS PANEL, ACUTE
HCV Ab: NEGATIVE
Hep A IgM: NONREACTIVE
Hep B C IgM: NONREACTIVE
Hepatitis B Surface Ag: NEGATIVE

## 2016-01-21 ENCOUNTER — Other Ambulatory Visit: Payer: Self-pay | Admitting: Family Medicine

## 2016-01-21 NOTE — Telephone Encounter (Signed)
We discussed labs and she agreed to get [place] of liver since liver enzymes elevated intermittently , hepatitis panel negative.

## 2016-01-21 NOTE — Addendum Note (Signed)
Addended by: Lenell Antu on: 01/21/2016 07:53 PM   Modules accepted: Orders

## 2016-02-05 ENCOUNTER — Ambulatory Visit
Admission: RE | Admit: 2016-02-05 | Discharge: 2016-02-05 | Disposition: A | Discharge status: Home / Self Care | Payer: No Typology Code available for payment source | Source: Ambulatory Visit | Attending: Family Medicine | Admitting: Family Medicine

## 2016-02-05 DIAGNOSIS — R748 Abnormal levels of other serum enzymes: Secondary | ICD-10-CM

## 2016-02-11 ENCOUNTER — Encounter: Payer: Self-pay | Admitting: Family Medicine

## 2016-06-21 ENCOUNTER — Other Ambulatory Visit: Payer: Self-pay | Admitting: Family Medicine

## 2016-06-21 DIAGNOSIS — Z139 Encounter for screening, unspecified: Secondary | ICD-10-CM

## 2016-07-02 ENCOUNTER — Ambulatory Visit
Admission: RE | Admit: 2016-07-02 | Discharge: 2016-07-02 | Disposition: A | Discharge status: Home / Self Care | Payer: No Typology Code available for payment source | Source: Ambulatory Visit | Attending: Family Medicine | Admitting: Family Medicine

## 2016-07-02 DIAGNOSIS — Z139 Encounter for screening, unspecified: Secondary | ICD-10-CM

## 2016-07-27 ENCOUNTER — Ambulatory Visit (INDEPENDENT_AMBULATORY_CARE_PROVIDER_SITE_OTHER): Payer: Self-pay | Admitting: Family Medicine

## 2016-07-27 VITALS — BP 118/70 | HR 73 | Temp 98.2°F | Resp 17 | Ht 59.5 in | Wt 131.0 lb

## 2016-07-27 DIAGNOSIS — R5383 Other fatigue: Secondary | ICD-10-CM

## 2016-07-27 DIAGNOSIS — Z23 Encounter for immunization: Secondary | ICD-10-CM

## 2016-07-27 DIAGNOSIS — Z0000 Encounter for general adult medical examination without abnormal findings: Secondary | ICD-10-CM

## 2016-07-27 DIAGNOSIS — E039 Hypothyroidism, unspecified: Secondary | ICD-10-CM

## 2016-07-27 DIAGNOSIS — Z124 Encounter for screening for malignant neoplasm of cervix: Secondary | ICD-10-CM

## 2016-07-27 DIAGNOSIS — R748 Abnormal levels of other serum enzymes: Secondary | ICD-10-CM

## 2016-07-27 DIAGNOSIS — Z01419 Encounter for gynecological examination (general) (routine) without abnormal findings: Secondary | ICD-10-CM

## 2016-07-27 DIAGNOSIS — Z136 Encounter for screening for cardiovascular disorders: Secondary | ICD-10-CM

## 2016-07-27 LAB — CBC WITH DIFFERENTIAL/PLATELET
BASOS ABS: 0 {cells}/uL (ref 0–200)
Basophils Relative: 0 %
EOS ABS: 0 {cells}/uL — AB (ref 15–500)
Eosinophils Relative: 0 %
HCT: 38 % (ref 35.0–45.0)
Hemoglobin: 11.7 g/dL (ref 11.7–15.5)
LYMPHS PCT: 19 %
Lymphs Abs: 1197 cells/uL (ref 850–3900)
MCH: 21.5 pg — AB (ref 27.0–33.0)
MCHC: 30.8 g/dL — ABNORMAL LOW (ref 32.0–36.0)
MCV: 69.9 fL — ABNORMAL LOW (ref 80.0–100.0)
MONOS PCT: 9 %
MPV: 9.4 fL (ref 7.5–12.5)
Monocytes Absolute: 567 cells/uL (ref 200–950)
Neutro Abs: 4536 cells/uL (ref 1500–7800)
Neutrophils Relative %: 72 %
PLATELETS: 337 10*3/uL (ref 140–400)
RBC: 5.44 MIL/uL — ABNORMAL HIGH (ref 3.80–5.10)
RDW: 17.9 % — AB (ref 11.0–15.0)
WBC: 6.3 10*3/uL (ref 3.8–10.8)

## 2016-07-27 LAB — POCT URINE PREGNANCY: PREG TEST UR: NEGATIVE

## 2016-07-27 LAB — TSH: TSH: 5.16 mIU/L — ABNORMAL HIGH

## 2016-07-27 NOTE — Progress Notes (Signed)
Patient ID: Teresa Michael, female    DOB: 1976/06/08, 40 y.o.   MRN: [id number]  PCP: Nilda SimmerSMITH,KRISTI, MD  Chief Complaint  Patient presents with  . Annual Exam  . Immunizations    FLU SHOT     Subjective:   HPI Presents for a complete physical exam with gynelogical exam.  HEENT- Denies noticing visual problems or diminished vision. She has never had a vision exam. Denies problems with swallowing, neck pain,  hearing loss or ear pain.  She has a history of thyroid disease levothyroxine 112 mcg and reports that she is followed by endocrinology.  Cardiovascular -Reports that she is still experiencing elevated blood pressure.  She has tried to taper off the metoprolol and she became hypertensive.  Currently takes metoprolol 25 mg. Occasionally monitors pressures at home.  Lungs-Denies shortness breath.   Breasts-Does not perform routine self-breast exams. Denies nipple discharge, breast pain, or visible mass.  Abdominal-Reports eating about 3 meals per day.  She reports eating out often. However, she is trying to increase the number of meals prepared at home.  She reports a normal bowel pattern.  Genitourinary -last menstrual period 8/15. She is married and reports uses condoms for contraception. Denies any sexual health related concerns.  Musculoskeletal-no issued reported  Neurological -patient reports headaches last two weeks with morning awakens. During the day she reports occasional light-headedness with movement.   Psychological Health   [facility] 07/27/2016 12/22/2015  Decreased Interest 0 0  Down, Depressed, Hopeless 0 0  PHQ - 2 Score 0 0   . Social History   Social History  . Marital status: Married    Spouse name: N/A  . Number of children: N/A  . Years of education: 712   Occupational History  . Book Biomedical engineerkeeper    Social History Main Topics  . Smoking status: Never Smoker  . Smokeless tobacco: Not on file  . Alcohol use Yes     Comment:  special occasions  . Drug use: No  . Sexual activity: Yes    Partners: Male    Birth control/ protection: Condom   Other Topics Concern  . Not on file   Social History Narrative   Pt. Exercises regularly    . Family History  Problem Relation Age of Onset  . Depression Mother   . Hypertension Mother   . Hypertension Father   . Hyperlipidemia Father   . Depression Brother   . Hypertension Maternal Grandfather     Review of Systems  See  HPI   Patient Active Problem List   Diagnosis Date Noted  . Unspecified hypothyroidism 10/04/2013     Prior to Admission medications   Medication Sig Start Date End Date Taking? Authorizing Provider  levothyroxine (SYNTHROID, LEVOTHROID) 112 MCG tablet Take 112 mcg by mouth daily before breakfast.   Yes Historical Provider, MD  metoprolol succinate (TOPROL-XL) 25 MG 24 hr tablet Take 1 tablet (25 mg total) by mouth daily. 12/22/15  Yes Thao P Le, DO  cephALEXin (KEFLEX) 500 MG capsule Take 1 capsule (500 mg total) by mouth 2 (two) times daily. Patient not taking: Reported on 07/27/2016 12/22/15   Thao P Le, DO  fluconazole (DIFLUCAN) 150 MG tablet Take 1 tablet (150 mg total) by mouth once. Patient not taking: Reported on 07/27/2016 12/22/15   Thao P Le, DO  metroNIDAZOLE (FLAGYL) 500 MG tablet Take 1 tablet (500 mg total) by mouth 2 (two) times daily. Patient not taking: Reported on 07/27/2016  12/22/15   Thao P Le, DO     No Known Allergies     Objective:  Physical Exam  Constitutional: She is oriented to person, place, and time. She appears well-developed and well-nourished.  HENT:  Head: Normocephalic and atraumatic.  Right Ear: External ear normal.  Left Ear: External ear normal.  Nose: Nose normal.  Mouth/Throat: Oropharynx is clear and moist.  Eyes: Conjunctivae and EOM are normal. Pupils are equal, round, and reactive to light.  Neck: Normal range of motion.  Cardiovascular: Normal rate, regular rhythm, normal heart sounds  and intact distal pulses.   Pulmonary/Chest: Effort normal and breath sounds normal.  Abdominal: Soft. Bowel sounds are normal. She exhibits no distension and no mass. There is no tenderness. There is no rebound and no guarding.  Genitourinary: Vagina normal and uterus normal. No vaginal discharge found.  Genitourinary Comments: Breasts are symmetric without cutaneous changes, nipple inversion or discharge. No masses or tenderness, and no axillary lymphadenopathy.  Normal female external genitalia without lesion. No inguinal lymphadenopathy. Vaginal mucosa is pink and moist without lesions. Cervix is closed without discharge, not friable. Pap smear obtained. No cervical motion tenderness, adnexal fullness or tenderness.  Musculoskeletal: Normal range of motion.  Neurological: She is alert and oriented to person, place, and time.  Skin: Skin is warm and dry.    Vitals:   07/27/16 1006  BP: 118/70  Pulse: 73  Resp: 17  Temp: 98.2 F (36.8 C)   Assessment & Plan:  1. Annual physical exam Age appropriate anticipatory guidance provided.  2. Encounter for routine gynecological examination - Pap IG and HPV (high risk) DNA detection - POCT urine pregnancy Mammogram obtained last month with   3. Screening for cardiovascular condition - Lipid panel  4. Hypothyroidism, reports previously stable.  Will recheck and readjust levothyroxine if levels remain high. -TSH  5. Other fatigue - CBC with Differential/Platelet - TSH  6.  Elevated liver enzymes, ALT 8 months ago was elevated. - COMPLETE METABOLIC PANEL WITH GFR  7. Immunization due - Flu Vaccine QUAD 36+ mos IM -TDAP Vaccine  Godfrey Pick. Tiburcio Pea, MSN, FNP-C [facility]

## 2016-07-27 NOTE — Patient Instructions (Addendum)
I will follow-up with you regarding your lab results. Follow-up in one year for next annual exam.    Exercising to Stay Healthy Exercising regularly is important. It has many health benefits, such as:  Improving your overall fitness, flexibility, and endurance.  Increasing your bone density.  Helping with weight control.  Decreasing your body fat.  Increasing your muscle strength.  Reducing stress and tension.  Improving your overall health. In order to become healthy and stay healthy, it is recommended that you do moderate-intensity and vigorous-intensity exercise. You can tell that you are exercising at a moderate intensity if you have a higher heart rate and faster breathing, but you are still able to hold a conversation. You can tell that you are exercising at a vigorous intensity if you are breathing much harder and faster and cannot hold a conversation while exercising. HOW OFTEN SHOULD I EXERCISE? Choose an activity that you enjoy and set realistic goals. Your health care provider can help you to make an activity plan that works for you. Exercise regularly as directed by your health care provider. This may include:   Doing resistance training twice each week, such as:  Push-ups.  Sit-ups.  Lifting weights.  Using resistance bands.  Doing a given intensity of exercise for a given amount of time. Choose from these options:  150 minutes of moderate-intensity exercise every week.  75 minutes of vigorous-intensity exercise every week.  A mix of moderate-intensity and vigorous-intensity exercise every week. Children, pregnant women, people who are out of shape, people who are overweight, and older adults may need to consult a health care provider for individual recommendations. If you have any sort of medical condition, be sure to consult your health care provider before starting a new exercise program.  WHAT ARE SOME EXERCISE IDEAS? Some moderate-intensity exercise ideas  include:   Walking at a rate of 1 mile in 15 minutes.  Biking.  Hiking.  Golfing.  Dancing. Some vigorous-intensity exercise ideas include:   Walking at a rate of at least 4.5 miles per hour.  Jogging or running at a rate of 5 miles per hour.  Biking at a rate of at least 10 miles per hour.  Lap swimming.  Roller-skating or in-line skating.  Cross-country skiing.  Vigorous competitive sports, such as football, basketball, and soccer.  Jumping rope.  Aerobic dancing. WHAT ARE SOME EVERYDAY ACTIVITIES THAT CAN HELP ME TO GET EXERCISE?  Yard work, such as:  Child psychotherapistushing a lawn mower.  Raking and bagging leaves.  Washing and waxing your car.  Pushing a stroller.  Shoveling snow.  Gardening.  Washing windows or floors. HOW CAN I BE MORE ACTIVE IN MY DAY-TO-DAY ACTIVITIES?  Use the stairs instead of the elevator.  Take a walk during your lunch break.  If you drive, park your car farther away from work or school.  If you take public transportation, get off one stop early and walk the rest of the way.  Make all of your phone calls while standing up and walking around.  Get up, stretch, and walk around every 30 minutes throughout the day. WHAT GUIDELINES SHOULD I FOLLOW WHILE EXERCISING?  Do not exercise so much that you hurt yourself, feel dizzy, or get very short of breath.  Consult your health care provider before starting a new exercise program.  Wear comfortable clothes and shoes with good support.  Drink plenty of water while you exercise to prevent dehydration or heat stroke. Body water is lost during exercise  and must be replaced.  Work out until you breathe faster and your heart beats faster.   This information is not intended to replace advice given to you by your health care provider. Make sure you discuss any questions you have with your health care provider.   Document Released: 12/18/2010 Document Revised: 12/06/2014 Document Reviewed:  04/18/2014 Elsevier Interactive Patient Education 2016 [employer].     IF you received an x-ray today, you will receive an invoice from [facility]. Please contact [facility] at [phone] with questions or concerns regarding your invoice.   IF you received labwork today, you will receive an invoice from [employer]. Please contact [facility] at [phone] with questions or concerns regarding your invoice.   Our billing staff will not be able to assist you with questions regarding bills from these companies.  You will be contacted with the lab results as soon as they are available. The fastest way to get your results is to activate your My Chart account. Instructions are located on the last page of this paperwork. If you have not heard from [place] regarding the results in 2 weeks, please contact this office.

## 2016-07-28 LAB — COMPLETE METABOLIC PANEL WITH GFR
ALT: 72 U/L — ABNORMAL HIGH (ref 6–29)
AST: 36 U/L — ABNORMAL HIGH (ref 10–30)
Albumin: 4.7 g/dL (ref 3.6–5.1)
Alkaline Phosphatase: 97 U/L (ref 33–115)
BUN: 17 mg/dL (ref 7–25)
CHLORIDE: 102 mmol/L (ref 98–110)
CO2: 22 mmol/L (ref 20–31)
Calcium: 8.9 mg/dL (ref 8.6–10.2)
Creat: 0.8 mg/dL (ref 0.50–1.10)
Glucose, Bld: 89 mg/dL (ref 65–99)
POTASSIUM: 3.8 mmol/L (ref 3.5–5.3)
Sodium: 138 mmol/L (ref 135–146)
Total Bilirubin: 0.3 mg/dL (ref 0.2–1.2)
Total Protein: 7.5 g/dL (ref 6.1–8.1)

## 2016-07-28 LAB — LIPID PANEL
Cholesterol: 258 mg/dL — ABNORMAL HIGH (ref 125–200)
HDL: 69 mg/dL (ref >=46)
LDL CALC: 140 mg/dL — AB (ref <130)
Total CHOL/HDL Ratio: 3.7 Ratio (ref <=5.0)
Triglycerides: 245 mg/dL — ABNORMAL HIGH (ref <150)
VLDL: 49 mg/dL — ABNORMAL HIGH (ref <30)

## 2016-07-29 LAB — PAP IG AND HPV HIGH-RISK: HPV DNA HIGH RISK: NOT DETECTED

## 2016-08-11 ENCOUNTER — Telehealth: Payer: Self-pay | Admitting: *Deleted

## 2016-08-11 NOTE — Progress Notes (Signed)
Lm on voicemail to call back

## 2016-08-11 NOTE — Telephone Encounter (Signed)
Patient needs to contact endocrinologist for follow up or come back to  [facility] for follow up in 4 weeks.  Her tsh was slightly elevated.  Per lab note

## 2016-08-11 NOTE — Telephone Encounter (Signed)
Pt called about her lab results.  Please call

## 2016-08-12 NOTE — Telephone Encounter (Signed)
PATIENT IS RETURNING OUR CALL FROM Wednesday REGARDING HER LAB RESULTS. BEST PHONE [phone] (CELL)  PHARMACY CHOICE IS [facility] ON [address].  MBC

## 2016-08-13 NOTE — Telephone Encounter (Signed)
Patient notified of results.

## 2017-01-03 ENCOUNTER — Ambulatory Visit (INDEPENDENT_AMBULATORY_CARE_PROVIDER_SITE_OTHER): Payer: Self-pay | Admitting: Physician Assistant

## 2017-01-03 VITALS — BP 129/74 | HR 104 | Temp 99.3°F | Resp 16 | Ht 59.0 in | Wt 132.0 lb

## 2017-01-03 DIAGNOSIS — J111 Influenza due to unidentified influenza virus with other respiratory manifestations: Secondary | ICD-10-CM

## 2017-01-03 DIAGNOSIS — R69 Illness, unspecified: Secondary | ICD-10-CM

## 2017-01-03 MED ORDER — OSELTAMIVIR PHOSPHATE 75 MG PO CAPS: 75.0000 mg | ORAL_CAPSULE | Freq: Two times a day (BID) | ORAL | 0 refills | Status: DC

## 2017-01-03 MED ORDER — NAPROXEN 500 MG PO TABS: 500.0000 mg | ORAL_TABLET | Freq: Two times a day (BID) | ORAL | 0 refills | Status: DC

## 2017-01-03 NOTE — Patient Instructions (Addendum)
  Come back in 5 days if your are not improving.  Come back sooner if you are worse.     IF you received an x-ray today, you will receive an invoice from [facility]. Please contact [facility] at [phone] with questions or concerns regarding your invoice.   IF you received labwork today, you will receive an invoice from [facility]. Please contact [facility] at [phone] with questions or concerns regarding your invoice.   Our billing staff will not be able to assist you with questions regarding bills from these companies.  You will be contacted with the lab results as soon as they are available. The fastest way to get your results is to activate your My Chart account. Instructions are located on the last page of this paperwork. If you have not heard from us regarding the results in 2 weeks, please contact this office.

## 2017-01-03 NOTE — Progress Notes (Signed)
  01/03/2017 5:12 PM   DOB: Aug 03, 1976 / MRN: [id number]  SUBJECTIVE:  Teresa Michael is a 41 y.o. female presenting for cough.  This started last night. She assoicates fatigue, nausea, body aches, HA.  Her husband was diagnosed with the flu yesterday. She feels that she is getting worse.  She denies a history of diabetes and asthma.  Immunization History  Administered Date(s) Administered  . Influenza,inj,Quad PF,36+ Mos 07/27/2016  . Tdap 07/27/2016     She has No Known Allergies.   She  has a past medical history of Hyperlipidemia; Hypertension; and Thyroid disease.    She  reports that she has never smoked. She does not have any smokeless tobacco history on file. She reports that she drinks alcohol. She reports that she does not use drugs. She  reports that she currently engages in sexual activity and has had female partners. She reports using the following method of birth control/protection: Condom. The patient  has a past surgical history that includes Cesarean section.  Her family history includes Depression in her brother and mother; Hyperlipidemia in her father; Hypertension in her father, maternal grandfather, and mother.  Review of Systems  Constitutional: Negative for chills and fever.  Respiratory: Positive for cough. Negative for hemoptysis, sputum production, shortness of breath and wheezing.   Cardiovascular: Negative for chest pain and leg swelling.  Skin: Negative for itching and rash.    The problem list and medications were reviewed and updated by myself where necessary and exist elsewhere in the encounter.   OBJECTIVE:  BP 129/74   Pulse (!) 104   Temp 99.3 F (37.4 C) (Oral)   Resp 16   Ht 4\' 11"  (1.499 m)   Wt 132 lb (59.9 kg)   LMP 12/27/2016   SpO2 100%   BMI 26.66 kg/m   Physical Exam  Constitutional: She is oriented to person, place, and time. She appears well-developed and well-nourished. No distress.  Cardiovascular: Normal rate and regular rhythm.    Pulmonary/Chest: Effort normal and breath sounds normal. No respiratory distress.  Musculoskeletal: Normal range of motion. She exhibits no edema or tenderness.  Neurological: She is alert and oriented to person, place, and time.  Skin: She is not diaphoretic.    No results found for this or any previous visit (from the past 72 hour(s)).  No results found.  ASSESSMENT AND PLAN:  Teresa Michael was seen today for cough, generalized body aches, dizziness, sinusitis and sore throat.  Diagnoses and all orders for this visit:  Influenza-like illness:  -     oseltamivir (TAMIFLU) 75 MG capsule; Take 1 capsule (75 mg total) by mouth 2 (two) times daily. -     naproxen (NAPROSYN) 500 MG tablet; Take 1 tablet (500 mg total) by mouth 2 (two) times daily with a meal.    The patient is advised to call or return to clinic if she does not see an improvement in symptoms, or to seek the care of the closest emergency department if she worsens with the above plan.   Deliah BostonMichael Clark, MHS, PA-C [facility] 01/03/2017 5:12 PM

## 2017-07-23 ENCOUNTER — Ambulatory Visit (INDEPENDENT_AMBULATORY_CARE_PROVIDER_SITE_OTHER): Payer: Self-pay | Admitting: Family Medicine

## 2017-07-23 ENCOUNTER — Encounter: Payer: Self-pay | Admitting: Family Medicine

## 2017-07-23 VITALS — BP 150/88 | HR 85 | Temp 97.7°F | Resp 16 | Ht 59.0 in | Wt 131.6 lb

## 2017-07-23 DIAGNOSIS — H6981 Other specified disorders of Eustachian tube, right ear: Secondary | ICD-10-CM

## 2017-07-23 DIAGNOSIS — J069 Acute upper respiratory infection, unspecified: Secondary | ICD-10-CM

## 2017-07-23 DIAGNOSIS — H6991 Unspecified Eustachian tube disorder, right ear: Secondary | ICD-10-CM

## 2017-07-23 DIAGNOSIS — R030 Elevated blood-pressure reading, without diagnosis of hypertension: Secondary | ICD-10-CM

## 2017-07-23 NOTE — Progress Notes (Signed)
  Chief Complaint  Patient presents with  . Cough    x 3 weeks. Per pt thought it was allergies but not sure what it is, took benadryl x 1 week w/no relief  . Nasal Congestion    x 3 weeks    HPI   Pt reports that she has been having 3 weeks of congestion and cough. She tried benadryl for a week without improvement. In fact now her cough seems to be productive of sticky sputum She denies fevers, wheezing She has children at home No sick contact She reports pressure in her ears  Past Medical History:  Diagnosis Date  . Hyperlipidemia   . Hypertension   . Thyroid disease     Current Outpatient Prescriptions  Medication Sig Dispense Refill  . levothyroxine (SYNTHROID, LEVOTHROID) 112 MCG tablet Take 112 mcg by mouth daily before breakfast.    . naproxen (NAPROSYN) 500 MG tablet Take 1 tablet (500 mg total) by mouth 2 (two) times daily with a meal. (Patient not taking: Reported on 07/23/2017) 30 tablet 0  . oseltamivir (TAMIFLU) 75 MG capsule Take 1 capsule (75 mg total) by mouth 2 (two) times daily. (Patient not taking: Reported on 07/23/2017) 10 capsule 0   No current facility-administered medications for this visit.     Allergies: No Known Allergies  Past Surgical History:  Procedure Laterality Date  . CESAREAN SECTION      Social History   Social History  . Marital status: Married    Spouse name: N/A  . Number of children: N/A  . Years of education: 53   Occupational History  . Book Biomedical engineer    Social History Main Topics  . Smoking status: Never Smoker  . Smokeless tobacco: Never Used  . Alcohol use Yes     Comment: special occasions  . Drug use: No  . Sexual activity: Yes    Partners: Male    Birth control/ protection: Condom   Other Topics Concern  . Not on file   Social History Narrative   Pt. Exercises regularly    ROS Review of Systems See HPI Constitution: No fevers or chills No malaise No diaphoresis Skin: No rash or itching Eyes: no blurry  vision, no double vision GU: no dysuria or hematuria Neuro: no dizziness or headaches  Objective: Vitals:   07/23/17 1522 07/23/17 1548  BP: (!) 144/85 (!) 150/88  Pulse: 74 85  Resp: 16   Temp: 97.7 F (36.5 C)   TempSrc: Oral   SpO2: 100%   Weight: 131 lb 9.6 oz (59.7 kg)   Height: 4\' 11"  (1.499 m)     Physical Exam General: alert, oriented, in NAD Head: normocephalic, atraumatic, no sinus tenderness Eyes: EOM intact, no scleral icterus or conjunctival injection Ears: TM clear bilaterally, increased clear fluid on the right Nose: mucosa nonerythematous, nonedematous Throat: no pharyngeal exudate or erythema Lymph: no posterior auricular, submental or cervical lymph adenopathy Heart: normal rate, normal sinus rhythm, no murmurs Lungs: clear to auscultation bilaterally, no wheezing   Assessment and Plan Natalia was seen today for cough and nasal congestion.  Diagnoses and all orders for this visit:  Dysfunction of right eustachian tube- continue benadryl  Viral URI- supportive care as well as cepacol  Elevated BP without diagnosis of hypertension- likely related to poor feelings     Rozanne Heumann A Creta Levin

## 2017-07-23 NOTE — Patient Instructions (Addendum)
Try Cepacol lozenges and mucinex    IF you received an x-ray today, you will receive an invoice from [facility]. Please contact [facility] at [phone] with questions or concerns regarding your invoice.   IF you received labwork today, you will receive an invoice from [place]. Please contact [facility] at [phone] with questions or concerns regarding your invoice.   Our billing staff will not be able to assist you with questions regarding bills from these companies.  You will be contacted with the lab results as soon as they are available. The fastest way to get your results is to activate your My Chart account. Instructions are located on the last page of this paperwork. If you have not heard from [place] regarding the results in 2 weeks, please contact this office.    We recommend that you schedule a mammogram for breast cancer screening. Typically, you do not need a referral to do this. Please contact a local imaging center to schedule your mammogram.  [facility] - [phone]  *ask for the Radiology Department [facility]) - [phone] or [phone]  [facility] - [phone] [facility] - [phone] [facility] - [phone]  *ask for the Radiology Department [facility] - [phone]  *ask for the Radiology Department [facility] - [phone]  *ask for the Mammography Department [facility] - [phone] Viral Respiratory Infection A respiratory infection is an illness that affects part of the respiratory system, such as the lungs, nose, or throat. Most respiratory infections are caused by either viruses or bacteria. A respiratory infection that is caused by a virus is called a viral respiratory infection. Common types of viral respiratory infections include:  A cold.  The flu (influenza).  A respiratory syncytial virus (RSV)  infection.  How do I know if I have a viral respiratory infection? Most viral respiratory infections cause:  A stuffy or runny nose.  Yellow or green nasal discharge.  A cough.  Sneezing.  Fatigue.  Achy muscles.  A sore throat.  Sweating or chills.  A fever.  A headache.  How are viral respiratory infections treated? If influenza is diagnosed early, it may be treated with an antiviral medicine that shortens the length of time a person has symptoms. Symptoms of viral respiratory infections may be treated with over-the-counter and prescription medicines, such as:  Expectorants. These make it easier to cough up mucus.  Decongestant nasal sprays.  Health care providers do not prescribe antibiotic medicines for viral infections. This is because antibiotics are designed to kill bacteria. They have no effect on viruses. How do I know if I should stay home from work or school? To avoid exposing others to your respiratory infection, stay home if you have:  A fever.  A persistent cough.  A sore throat.  A runny nose.  Sneezing.  Muscles aches.  Headaches.  Fatigue.  Weakness.  Chills.  Sweating.  Nausea.  Follow these instructions at home:  Rest as much as possible.  Take over-the-counter and prescription medicines only as told by your health care provider.  Drink enough fluid to keep your urine clear or pale yellow. This helps prevent dehydration and helps loosen up mucus.  Gargle with a salt-water mixture 3-4 times per day or as needed. To make a salt-water mixture, completely dissolve -1 tsp of salt in 1 cup of warm water.  Use nose drops made from salt water to  ease congestion and soften raw skin around your nose.  Do not drink alcohol.  Do not use tobacco products, including cigarettes, chewing tobacco, and e-cigarettes. If you need help quitting, ask your health care provider. Contact a health care provider if:  Your symptoms last for 10 days  or longer.  Your symptoms get worse over time.  You have a fever.  You have severe sinus pain in your face or forehead.  The glands in your jaw or neck become very swollen. Get help right away if:  You feel pain or pressure in your chest.  You have shortness of breath.  You faint or feel like you will faint.  You have severe and persistent vomiting.  You feel confused or disoriented. This information is not intended to replace advice given to you by your health care provider. Make sure you discuss any questions you have with your health care provider. Document Released: 08/25/2005 Document Revised: 04/22/2016 Document Reviewed: 04/23/2015 Elsevier Interactive Patient Education  2017 [employer].

## 2017-07-31 ENCOUNTER — Ambulatory Visit (HOSPITAL_COMMUNITY)
Admission: EM | Admit: 2017-07-31 | Discharge: 2017-07-31 | Disposition: A | Discharge status: Home / Self Care | Payer: No Typology Code available for payment source | Attending: Emergency Medicine | Admitting: Emergency Medicine

## 2017-07-31 ENCOUNTER — Encounter (HOSPITAL_COMMUNITY): Payer: Self-pay | Admitting: *Deleted

## 2017-07-31 DIAGNOSIS — J209 Acute bronchitis, unspecified: Secondary | ICD-10-CM

## 2017-07-31 DIAGNOSIS — R05 Cough: Secondary | ICD-10-CM

## 2017-07-31 MED ORDER — PREDNISONE 50 MG PO TABS: ORAL_TABLET | ORAL | 0 refills | Status: DC

## 2017-07-31 MED ORDER — AZITHROMYCIN 250 MG PO TABS: 250.0000 mg | ORAL_TABLET | Freq: Every day | ORAL | 0 refills | Status: DC

## 2017-07-31 MED ORDER — GUAIFENESIN-CODEINE 100-10 MG/5ML PO SOLN: 5.0000 mL | Freq: Three times a day (TID) | ORAL | 0 refills | Status: DC | PRN

## 2017-07-31 NOTE — ED Provider Notes (Signed)
[facility]   [id number] 07/31/17 Arrival Time: 1633   SUBJECTIVE:  Teresa Michael is a 41 y.o. female who presents to the urgent care with complaint of cough for 4 weeks. Was seen by her primary care provider one week ago, diagnosed with viral URI, encouraged over-the-counter therapies. She states these have not helped. She has had some chills, no fever, no nausea, no vomiting, no diarrhea, cough is described as productive with green mucus. She has had no night sweats, no unexpected her unplanned weight loss, no sinus congestion, no ear pain or pressure.     Past Medical History:  Diagnosis Date  . Hyperlipidemia   . Hypertension   . Thyroid disease    Social History   Social History  . Marital status: Married    Spouse name: N/A  . Number of children: N/A  . Years of education: 6512   Occupational History  . Book Biomedical engineerkeeper    Social History Main Topics  . Smoking status: Never Smoker  . Smokeless tobacco: Never Used  . Alcohol use Yes     Comment: special occasions  . Drug use: No  . Sexual activity: Yes    Partners: Male    Birth control/ protection: Condom   Other Topics Concern  . Not on file   Social History Narrative   Pt. Exercises regularly   No outpatient prescriptions have been marked as taking for the 07/31/17 encounter [facility](Hospital Encounter).   No Known Allergies    ROS: As per HPI, remainder of ROS negative.   OBJECTIVE:  Vitals:   07/31/17 1724  BP: 120/80  Pulse: 78  Resp: 18  Temp: 98.1 F (36.7 C)  TempSrc: Oral  SpO2: 100%       General Appearance:  awake, alert, oriented, in no acute distress, well developed, well nourished and in no acute distress Skin:  skin color, texture, turgor are normal Head/face:  NCAT Eyes:  PERRL and EOMI Ears:  canals and TMs NI and External- bilateral-  normal Mouth/Throat:  Mucosa moist, no lesions; pharynx without erythema, edema or exudate. Neck:  neck- supple, no mass, non-tender and no  cervical lymphadenopathy Lungs:  Diffuse rhonchi in the bases, no wheezes, no evidence of respiratory distress Heart:  Heart regular rate and rhythm Abdomen:  Soft and nontender Extremities: Extremities warm to touch, pink, with no edema. Peripheral Pulses:  Capillary refill <2secs, strong peripheral pulses Neurologic:  Alert and oriented     Labs: Labs Reviewed - No data to display  No results found.     ASSESSMENT & PLAN:  1. Acute bronchitis, unspecified organism     Meds ordered this encounter  Medications  . azithromycin (ZITHROMAX) 250 MG tablet    Sig: Take 1 tablet (250 mg total) by mouth daily. Take first 2 tablets together, then 1 every day until finished.    Dispense:  6 tablet    Refill:  0    Order Specific Question:   Supervising Provider    Answer:   Sharlene DoryWENDLING, NICHOLAS PAUL [[id number]]  . predniSONE (DELTASONE) 50 MG tablet    Sig: Take 1 tablet daily with food    Dispense:  5 tablet    Refill:  0    Order Specific Question:   Supervising Provider    Answer:   Sharlene DoryWENDLING, NICHOLAS PAUL [[id number]]  . guaiFENesin-codeine 100-10 MG/5ML syrup    Sig: Take 5 mLs by mouth 3 (three) times daily as needed for cough.    Dispense:  120 mL    Refill:  0    Order Specific Question:   Supervising Provider    Answer:   Sharlene Dory [[id number]]    Reviewed expectations re: course of current medical issues. Questions answered. Outlined signs and symptoms indicating need for more acute intervention. Patient verbalized understanding. After Visit Summary given.    Procedures:        Dorena Bodo, NP 07/31/17 1843

## 2017-07-31 NOTE — Discharge Instructions (Signed)
I am treating you for bronchitis. I have prescribed Azithromycin. Take 2 tablets today, then 1 tablet daily till finished. I have also prescribed a steroid called prednisone. Take one tablet daily with food. I have also prescribed a medicine for cough called Cheratussin, this medicine is a narcotic, it will cause drowsiness, and it is addictive. Do not take more than what is necessary, do not drink alcohol while taking, and do not operate any heavy machinery while taking this medicine.  Should your symptoms fail to improve or worsen, follow up with your primary care provider, or return to clinic.

## 2017-07-31 NOTE — ED Triage Notes (Signed)
Cough   /   Congested   And  Runny     Nose    With   Symptoms   X  4  Weeks    Seen  1   Week  Ago  For    Virus   otc  meds  Not  Helping

## 2017-08-08 ENCOUNTER — Ambulatory Visit (INDEPENDENT_AMBULATORY_CARE_PROVIDER_SITE_OTHER): Payer: Self-pay | Admitting: Family Medicine

## 2017-08-08 VITALS — BP 144/86 | HR 84 | Temp 97.8°F | Resp 16 | Ht 59.0 in | Wt 134.0 lb

## 2017-08-08 DIAGNOSIS — J069 Acute upper respiratory infection, unspecified: Secondary | ICD-10-CM

## 2017-08-08 MED ORDER — FLUTICASONE PROPIONATE 50 MCG/ACT NA SUSP: 2.0000 | Freq: Every day | NASAL | 6 refills | Status: DC

## 2017-08-08 NOTE — Patient Instructions (Addendum)
   IF you received an x-ray today, you will receive an invoice from [facility]. Please contact [facility] at [phone] with questions or concerns regarding your invoice.   IF you received labwork today, you will receive an invoice from [facility]. Please contact [facility] at [phone] with questions or concerns regarding your invoice.   Our billing staff will not be able to assist you with questions regarding bills from these companies.  You will be contacted with the lab results as soon as they are available. The fastest way to get your results is to activate your My Chart account. Instructions are located on the last page of this paperwork. If you have not heard from us regarding the results in 2 weeks, please contact this office.    We recommend that you schedule a mammogram for breast cancer screening. Typically, you do not need a referral to do this. Please contact a local imaging center to schedule your mammogram.  [facility] - [phone]  *ask for the Radiology Department [facility]) - [phone] or [phone]  [facility] - [phone] [facility] - [phone] [facility] - [phone]  *ask for the Radiology Department [facility] - [phone]  *ask for the Radiology Department [facility] - [phone]  *ask for the Mammography Department [facility] - [phone] Upper Respiratory Infection, Adult Most upper respiratory infections (URIs) are caused by a virus. A URI affects the nose, throat, and upper air passages. The most common type of URI is often called "the common cold." Follow these instructions at home:  Take medicines only as told by your doctor.  Gargle warm saltwater or take cough drops to comfort your throat as told by your doctor.  Use a warm mist humidifier or inhale steam from a shower to increase air  moisture. This may make it easier to breathe.  Drink enough fluid to keep your pee (urine) clear or pale yellow.  Eat soups and other clear broths.  Have a healthy diet.  Rest as needed.  Go back to work when your fever is gone or your doctor says it is okay. ? You may need to stay home longer to avoid giving your URI to others. ? You can also wear a face mask and wash your hands often to prevent spread of the virus.  Use your inhaler more if you have asthma.  Do not use any tobacco products, including cigarettes, chewing tobacco, or electronic cigarettes. If you need help quitting, ask your doctor. Contact a doctor if:  You are getting worse, not better.  Your symptoms are not helped by medicine.  You have chills.  You are getting more short of breath.  You have brown or red mucus.  You have yellow or brown discharge from your nose.  You have pain in your face, especially when you bend forward.  You have a fever.  You have puffy (swollen) neck glands.  You have pain while swallowing.  You have white areas in the back of your throat. Get help right away if:  You have very bad or constant: ? Headache. ? Ear pain. ? Pain in your forehead, behind your eyes, and over your cheekbones (sinus pain). ? Chest pain.  You have long-lasting (chronic) lung disease and any of the following: ? Wheezing. ? Long-lasting cough. ? Coughing up blood. ? A change in your usual mucus.  You have a stiff   neck.  You have changes in your: ? Vision. ? Hearing. ? Thinking. ? Mood. This information is not intended to replace advice given to you by your health care provider. Make sure you discuss any questions you have with your health care provider. Document Released: 05/03/2008 Document Revised: 07/18/2016 Document Reviewed: 02/20/2014 Elsevier Interactive Patient Education  2018 [employer]

## 2017-08-08 NOTE — Progress Notes (Signed)
  Chief Complaint  Patient presents with  . URI    very congested w/cough and runny nose (rn), no fevers present, seen this is the 3rd time being seen for sxs that are a little better but patient states she feels bad.  Tried otc mucinex for congestion and it helps a little bit.    HPI   [facility] and was diagnosed with bronchitis She was given zpak, steroid and codeine cough med She reports that she is now taking mucinex which helps slightly She feels a little better  She has some sputum that is yellow, but nonbloody  Past Medical History:  Diagnosis Date  . Hyperlipidemia   . Hypertension   . Thyroid disease     Current Outpatient Prescriptions  Medication Sig Dispense Refill  . guaiFENesin-codeine 100-10 MG/5ML syrup Take 5 mLs by mouth 3 (three) times daily as needed for cough. 120 mL 0  . levothyroxine (SYNTHROID, LEVOTHROID) 112 MCG tablet Take 112 mcg by mouth daily before breakfast.    . fluticasone (FLONASE) 50 MCG/ACT nasal spray Place 2 sprays into both nostrils daily. 16 g 6   No current facility-administered medications for this visit.     Allergies: No Known Allergies  Past Surgical History:  Procedure Laterality Date  . CESAREAN SECTION      Social History   Social History  . Marital status: Married    Spouse name: N/A  . Number of children: N/A  . Years of education: 5812   Occupational History  . Book Biomedical engineerkeeper    Social History Main Topics  . Smoking status: Never Smoker  . Smokeless tobacco: Never Used  . Alcohol use Yes     Comment: special occasions  . Drug use: No  . Sexual activity: Yes    Partners: Male    Birth control/ protection: Condom   Other Topics Concern  . Not on file   Social History Narrative   Pt. Exercises regularly    ROS Review of Systems See HPI Constitution: No fevers or chills No malaise No diaphoresis Skin: No rash or itching Eyes: no blurry vision, no double vision GU: no dysuria or  hematuria Neuro: + dizziness but no headaches  Objective: Vitals:   08/08/17 1646  BP: (!) 144/86  Pulse: 84  Resp: 16  Temp: 97.8 F (36.6 C)  TempSrc: Oral  SpO2: 99%  Weight: 134 lb (60.8 kg)  Height: 4\' 11"  (1.499 m)    Physical Exam General: alert, oriented, in NAD Head: normocephalic, atraumatic, no sinus tenderness Eyes: EOM intact, no scleral icterus or conjunctival injection Ears: TM clear bilaterally Nose: mucosa nonerythematous, nonedematous Throat: no pharyngeal exudate or erythema Lymph: no posterior auricular, submental or cervical lymph adenopathy Heart: normal rate, normal sinus rhythm, no murmurs Lungs: clear to auscultation bilaterally, no wheezing   Assessment and Plan [place] was seen today for uri.  Diagnoses and all orders for this visit:  Acute upper respiratory infection  Other orders -     fluticasone (FLONASE) 50 MCG/ACT nasal spray; Place 2 sprays into both nostrils daily.   Supportive care  Zoe A Creta LevinStallings

## 2017-12-14 ENCOUNTER — Other Ambulatory Visit: Payer: Self-pay | Admitting: Family Medicine

## 2018-07-05 ENCOUNTER — Other Ambulatory Visit: Payer: Self-pay

## 2018-07-05 ENCOUNTER — Ambulatory Visit (INDEPENDENT_AMBULATORY_CARE_PROVIDER_SITE_OTHER): Payer: Self-pay | Admitting: Family Medicine

## 2018-07-05 ENCOUNTER — Encounter: Payer: Self-pay | Admitting: Family Medicine

## 2018-07-05 VITALS — BP 131/74 | HR 74 | Temp 97.9°F | Resp 16 | Ht 59.0 in | Wt 122.4 lb

## 2018-07-05 DIAGNOSIS — I10 Essential (primary) hypertension: Secondary | ICD-10-CM

## 2018-07-05 DIAGNOSIS — R7989 Other specified abnormal findings of blood chemistry: Secondary | ICD-10-CM

## 2018-07-05 DIAGNOSIS — Z1239 Encounter for other screening for malignant neoplasm of breast: Secondary | ICD-10-CM

## 2018-07-05 DIAGNOSIS — Z0000 Encounter for general adult medical examination without abnormal findings: Secondary | ICD-10-CM

## 2018-07-05 DIAGNOSIS — Z1231 Encounter for screening mammogram for malignant neoplasm of breast: Secondary | ICD-10-CM

## 2018-07-05 DIAGNOSIS — N644 Mastodynia: Secondary | ICD-10-CM

## 2018-07-05 DIAGNOSIS — F4321 Adjustment disorder with depressed mood: Secondary | ICD-10-CM

## 2018-07-05 LAB — COMPREHENSIVE METABOLIC PANEL
ALK PHOS: 106 IU/L (ref 39–117)
ALT: 71 IU/L — ABNORMAL HIGH (ref 0–32)
AST: 61 IU/L — ABNORMAL HIGH (ref 0–40)
Albumin/Globulin Ratio: 1.8 (ref 1.2–2.2)
Albumin: 4.4 g/dL (ref 3.5–5.5)
BUN/Creatinine Ratio: 16 (ref 9–23)
BUN: 13 mg/dL (ref 6–24)
Bilirubin Total: 0.3 mg/dL (ref 0.0–1.2)
CO2: 21 mmol/L (ref 20–29)
Calcium: 9.2 mg/dL (ref 8.7–10.2)
Chloride: 106 mmol/L (ref 96–106)
Creatinine, Ser: 0.8 mg/dL (ref 0.57–1.00)
GFR calc Af Amer: 104 mL/min/{1.73_m2} (ref >59)
GFR calc non Af Amer: 91 mL/min/{1.73_m2} (ref >59)
Globulin, Total: 2.4 g/dL (ref 1.5–4.5)
Glucose: 94 mg/dL (ref 65–99)
Potassium: 4.5 mmol/L (ref 3.5–5.2)
Sodium: 142 mmol/L (ref 134–144)
Total Protein: 6.8 g/dL (ref 6.0–8.5)

## 2018-07-05 LAB — CBC WITH DIFFERENTIAL/PLATELET
Basophils Absolute: 0 10*3/uL (ref 0.0–0.2)
Basos: 0 %
EOS (ABSOLUTE): 0 10*3/uL (ref 0.0–0.4)
Eos: 0 %
HEMATOCRIT: 37.9 % (ref 34.0–46.6)
HEMOGLOBIN: 10.7 g/dL — AB (ref 11.1–15.9)
Immature Grans (Abs): 0 10*3/uL (ref 0.0–0.1)
Immature Granulocytes: 0 %
Lymphocytes Absolute: 1.1 10*3/uL (ref 0.7–3.1)
Lymphs: 18 %
MCH: 19.5 pg — ABNORMAL LOW (ref 26.6–33.0)
MCHC: 28.2 g/dL — ABNORMAL LOW (ref 31.5–35.7)
MCV: 69 fL — ABNORMAL LOW (ref 79–97)
Monocytes Absolute: 0.8 10*3/uL (ref 0.1–0.9)
Monocytes: 12 %
NEUTROS PCT: 70 %
Neutrophils Absolute: 4.2 10*3/uL (ref 1.4–7.0)
Platelets: 361 10*3/uL (ref 150–450)
RBC: 5.49 x10E6/uL — ABNORMAL HIGH (ref 3.77–5.28)
RDW: 17.8 % — ABNORMAL HIGH (ref 12.3–15.4)
WBC: 6.1 10*3/uL (ref 3.4–10.8)

## 2018-07-05 LAB — LIPID PANEL
Chol/HDL Ratio: 3 ratio (ref 0.0–4.4)
Cholesterol, Total: 186 mg/dL (ref 100–199)
HDL: 61 mg/dL (ref >39)
LDL Calculated: 94 mg/dL (ref 0–99)
Triglycerides: 156 mg/dL — ABNORMAL HIGH (ref 0–149)
VLDL CHOLESTEROL CAL: 31 mg/dL (ref 5–40)

## 2018-07-05 NOTE — Progress Notes (Signed)
Chief Complaint  Patient presents with  . Annual Exam    with pap per pt.  Last pap 07/27/16  . Hypertension    notices her blood pressures are elevated at night  . left breast pain x couple weeks, otc med for pain    Subjective:  Teresa Michael is a 42 y.o. female here for a health maintenance visit.  Patient is established pt here for a physical.   Hypertension: Patient here for follow-up of elevated blood pressure.  Blood pressure is not well controlled at home and is getting evening readings of 150s/80s.. Cardiac symptoms none. Patient denies chest pain, claudication, dyspnea, exertional chest pressure/discomfort, irregular heart beat, lower extremity edema and orthopnea.  Cardiovascular risk factors: hypertension. Use of agents associated with hypertension: none.  BP Readings from Last 3 Encounters:  07/05/18 131/74  08/08/17 (!) 144/86  07/31/17 120/80    Grieving- her brother died about a month ago She feels like her husband is not supportive and her grief is causing strain in the relationships She states that she cannot sleep as a result [facility] 07/05/2018 07/05/2018 08/08/2017 07/23/2017 01/03/2017  Decreased Interest 1 0 0 0 0  Down, Depressed, Hopeless 1 0 0 0 0  PHQ - 2 Score 2 0 0 0 0  Altered sleeping 0 - - - -  Tired, decreased energy 3 - - - -  Change in appetite 1 - - - -  Feeling bad or failure about yourself  1 - - - -  Trouble concentrating 3 - - - -  Moving slowly or fidgety/restless 1 - - - -  Suicidal thoughts 0 - - - -  PHQ-9 Score 11 - - - -  Difficult doing work/chores Somewhat difficult - - - -     Patient Active Problem List   Diagnosis Date Noted  . Unspecified hypothyroidism 10/04/2013    Past Medical History:  Diagnosis Date  . Hyperlipidemia   . Hypertension   . Thyroid disease     Past Surgical History:  Procedure Laterality Date  . CESAREAN SECTION       Outpatient Medications Prior to Visit  Medication Sig Dispense  Refill  . levothyroxine (SYNTHROID, LEVOTHROID) 112 MCG tablet Take 137 mcg by mouth daily before breakfast.     . fluticasone (FLONASE) 50 MCG/ACT nasal spray Place 2 sprays into both nostrils daily. (Patient not taking: Reported on 07/05/2018) 16 g 6  . guaiFENesin-codeine 100-10 MG/5ML syrup Take 5 mLs by mouth 3 (three) times daily as needed for cough. (Patient not taking: Reported on 07/05/2018) 120 mL 0   No facility-administered medications prior to visit.     No Known Allergies   Family History  Problem Relation Age of Onset  . Depression Mother   . Hypertension Mother   . Hypertension Father   . Hyperlipidemia Father   . Depression Brother   . Hypertension Maternal Grandfather       Social History   Socioeconomic History  . Marital status: Married    Spouse name: Not on file  . Number of children: Not on file  . Years of education: 3712  . Highest education level: Not on file  Occupational History  . 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 Financial resource strain: Not on file  . Food insecurity:    Worry: Not on file    Inability: Not on file  . Transportation needs:    Medical: Not on file  Non-medical: Not on file  Tobacco Use  . Smoking status: Never Smoker  . Smokeless tobacco: Never Used  Substance and Sexual Activity  . Alcohol use: Yes    Comment: special occasions  . Drug use: No  . Sexual activity: Yes    Partners: Male    Birth control/protection: Condom  Lifestyle  . Physical activity:    Days per week: Not on file    Minutes per session: Not on file  . Stress: Not on file  Relationships  . Social connections:    Talks on phone: Not on file    Gets together: Not on file    Attends religious service: Not on file    Active member of club or organization: Not on file    Attends meetings of clubs or organizations: Not on file    Relationship status: Not on file  . Intimate partner violence:    Fear of current or ex partner: Not on file      Emotionally abused: Not on file    Physically abused: Not on file    Forced sexual activity: Not on file  Other Topics Concern  . Not on file  Social History Narrative   Pt. Exercises regularly   Social History   Substance and Sexual Activity  Alcohol Use Yes   Comment: special occasions   Social History   Tobacco Use  Smoking Status Never Smoker  Smokeless Tobacco Never Used   Social History   Substance and Sexual Activity  Drug Use No    GYN: Sexual Health Menstrual status: regular menses LMP: Patient's last menstrual period was 06/19/2018. Last pap smear: see HM section History of abnormal pap smears:  Sexually active: with female  partner  Health Maintenance: See under health Maintenance activity for review of completion dates as well. Immunization History  Administered Date(s) Administered  . Influenza,inj,Quad PF,6+ Mos 07/27/2016  . Tdap 07/27/2016      Depression Screen-PHQ2/9 Depression screen [facility] 2/9 07/05/2018 07/05/2018 08/08/2017 07/23/2017 01/03/2017  Decreased Interest 1 0 0 0 0  Down, Depressed, Hopeless 1 0 0 0 0  PHQ - 2 Score 2 0 0 0 0  Altered sleeping 0 - - - -  Tired, decreased energy 3 - - - -  Change in appetite 1 - - - -  Feeling bad or failure about yourself  1 - - - -  Trouble concentrating 3 - - - -  Moving slowly or fidgety/restless 1 - - - -  Suicidal thoughts 0 - - - -  PHQ-9 Score 11 - - - -  Difficult doing work/chores Somewhat difficult - - - -       Depression Severity and Treatment Recommendations:  0-4= None  5-9= Mild / Treatment: Support, educate to call if worse; return in one month  10-14= Moderate / Treatment: Support, watchful waiting; Antidepressant or Psycotherapy  15-19= Moderately severe / Treatment: Antidepressant OR Psychotherapy  >= 20 = Major depression, severe / Antidepressant AND Psychotherapy    Review of Systems   Review of Systems  Constitutional: Negative for chills and fever.  HENT: Negative  for congestion and sinus pain.   Respiratory: Negative for cough, shortness of breath and wheezing.   Cardiovascular: Negative for chest pain, palpitations and orthopnea.  Gastrointestinal: Negative for abdominal pain, nausea and vomiting.  Genitourinary: Negative for dysuria, frequency and urgency.  Skin: Negative for itching and rash.  Neurological: Negative for dizziness and headaches.  Psychiatric/Behavioral: Positive for depression. The  patient is nervous/anxious and has insomnia.     See HPI for ROS as well.    Objective:   Vitals:   07/05/18 0809  BP: 131/74  Pulse: 74  Resp: 16  Temp: 97.9 F (36.6 C)  TempSrc: Oral  SpO2: 100%  Weight: 122 lb 6.4 oz (55.5 kg)  Height: 4\' 11"  (1.499 m)    Body mass index is 24.72 kg/m.  Physical Exam  Constitutional: She is oriented to person, place, and time. She appears well-developed and well-nourished.  HENT:  Head: Normocephalic and atraumatic.  Eyes: Conjunctivae and EOM are normal.  Neck: Normal range of motion. Neck supple. No thyromegaly present.  Cardiovascular: Normal rate, regular rhythm and normal heart sounds.  No murmur heard. Pulmonary/Chest: Effort normal and breath sounds normal. No stridor. No respiratory distress. She has no wheezes. Right breast exhibits no inverted nipple, no mass, no nipple discharge, no skin change and no tenderness. Left breast exhibits no inverted nipple, no mass, no nipple discharge, no skin change and no tenderness. There is breast tenderness. No breast swelling, discharge or bleeding. Breasts are symmetrical.    Abdominal: Soft. Bowel sounds are normal. She exhibits no distension and no mass. There is no tenderness. There is no guarding.  Musculoskeletal: Normal range of motion. She exhibits no edema.  Neurological: She is alert and oriented to person, place, and time.  Skin: Skin is warm. Capillary refill takes less than 2 seconds. No erythema.  Psychiatric: She has a normal mood and  affect. Her behavior is normal. Judgment and thought content normal.       Assessment/Plan:   Patient was seen for a health maintenance exam.  Counseled the patient on health maintenance issues. Reviewed her health mainteance schedule and ordered appropriate tests (see orders.) Counseled on regular exercise and weight management. Recommend regular eye exams and dental cleaning.   The following issues were addressed today for health maintenance:   [place] was seen today for annual exam, hypertension and left breast pain x couple weeks, otc med for pain.  Diagnoses and all orders for this visit:  Encounter for health maintenance examination in adult-  Women's Health Maintenance Plan Advised monthly breast exam and annual mammogram Advised dental exam every six months Discussed stress management Discussed pap smear screening guidelines  Screening for breast cancer Breast pain, left -     MM Digital Diagnostic Bilat; Future Discussed breast cancer screening Advised cutting back on caffeine  Grieving -  Discussed appropriate response to grieving and offered resources  Essential hypertension- advised follow up with her home bp cuff Discussed stress and its impact on bp -     Lipid panel -     Comprehensive metabolic panel  Abnormal CBC -     CBC with Differential/Platelet    Return in about 2 months (around 09/04/2018) for bp  check, bring monitor.    Body mass index is 24.72 kg/m.:  Discussed the patient's BMI with patient. The BMI body mass index is 24.72 kg/m.     No future appointments.  Patient Instructions    [facility] Address: [address] [place], [place] [address]  Phone: [phone]  A nonprofit organization supporting thousands in [place] with intellectual and developmental disabilities, mental illness, and substance use disorders.  [employer]ical [employer] LOCATED IN [place]OWNTOWN [place] IN [place] ( 2 doors up  from [employer] [facility])   [facility] [address] [place], [place]. [address] [phone]  [phone]  ---------------------------------------------------------- [facility] [address] [place], [place] [address]  Contact us at: Office Phone: [phone] Email: [email or web address]   IF you received an x-ray today, you will receive an invoice from [facility]. Please contact [facility] at [phone] with questions or concerns regarding your invoice.   IF you received labwork today, you will receive an invoice from [facility]. Please contact [facility] at [phone] with questions or concerns regarding your invoice.   Our billing staff will not be able to assist you with questions regarding bills from these companies.  You will be contacted with the lab results as soon as they are available. The fastest way to get your results is to activate your My Chart account. Instructions are located on the last page of this paperwork. If you have not heard from [place] regarding the results in 2 weeks, please contact this office.     Breast Self-Awareness Breast self-awareness means:  Knowing how your breasts look.  Knowing how your breasts feel.  Checking your breasts every month for changes.  Telling your doctor if you notice a change in your breasts.  Breast self-awareness allows you to notice a breast problem early while it is still small. How to do a breast self-exam One way to learn what is normal for your breasts and to check for changes is to do a breast self-exam. To do a breast self-exam: Look for Changes  1. Take off all the clothes above your waist. 2. Stand in front of a mirror in a room with good lighting. 3. Put your hands on your hips. 4. Push your hands down. 5. Look at your breasts and nipples in the mirror to see if one breast or nipple looks different than the other. Check to see  if: ? The shape of one breast is different. ? The size of one breast is different. ? There are wrinkles, dips, and bumps in one breast and not the other. 6. Look at each breast for changes in your skin, such as: ? Redness. ? Scaly areas. 7. Look for changes in your nipples, such as: ? Liquid around the nipples. ? Bleeding. ? Dimpling. ? Redness. ? A change in where the nipples are. Feel for Changes 1. Lie on your back on the floor. 2. Feel each breast. To do this, follow these steps: ? Pick a breast to feel. ? Put the arm closest to that breast above your head. ? Use your other arm to feel the nipple area of your breast. Feel the area with the pads of your three middle fingers by making small circles with your fingers. For the first circle, press lightly. For the second circle, press harder. For the third circle, press even harder. ? Keep making circles with your fingers at the light, harder, and even harder pressures as you move down your breast. Stop when you feel your ribs. ? Move your fingers a little toward the center of your body. ? Start making circles with your fingers again, this time going up until you reach your collarbone. ? Keep making up and down circles until you reach your armpit. Remember to keep using the three pressures. ? Feel the other breast in the same way. 3. Sit or stand in the shower or tub. 4. With soapy water on your skin, feel each breast the same way you did in step 2, when you were lying on the floor. Write Down What You Find  After doing the  self-exam, write down:  What is normal for each breast.  Any changes you find in each breast.  When you last had your period.  How often should I check my breasts? Check your breasts every month. If you are breastfeeding, the best time to check them is after you feed your baby or after you use a breast pump. If you get periods, the best time to check your breasts is 5-7 days after your period is over. When  should I see my doctor? See your doctor if you notice:  A change in shape or size of your breasts or nipples.  A change in the skin of your breast or nipples, such as red or scaly skin.  Unusual fluid coming from your nipples.  A lump or thick area that was not there before.  Pain in your breasts.  Anything that concerns you.  This information is not intended to replace advice given to you by your health care provider. Make sure you discuss any questions you have with your health care provider. Document Released: 05/03/2008 Document Revised: 04/22/2016 Document Reviewed: 10/05/2015 Elsevier Interactive Patient Education  [employer].

## 2018-07-05 NOTE — Patient Instructions (Addendum)
[facility] Address: [address], [place], [place] [address]  Phone: [phone]) [phone]  A nonprofit organization supporting thousands in [place] with intellectual and developmental disabilities, mental illness, and substance use disorders.  [employer] [place] ( 2 doors up from [facility])   Mailing Address [address], [address] [place], [place] [address] [phone]) [phone]  ---------------------------------------------------------- [facility] [address]  [address] [place], [place] [address]  Contact us at: Office Phone: [phone] Email: [email or web address]   IF you received an x-ray today, you will receive an invoice from [facility]. Please contact [facility] at [phone] with questions or concerns regarding your invoice.   IF you received labwork today, you will receive an invoice from [facility]. Please contact [facility] at [phone] with questions or concerns regarding your invoice.   Our billing staff will not be able to assist you with questions regarding bills from these companies.  You will be contacted with the lab results as soon as they are available. The fastest way to get your results is to activate your My Chart account. Instructions are located on the last page of this paperwork. If you have not heard from us regarding the results in 2 weeks, please contact this office.     Breast Self-Awareness Breast self-awareness means:  Knowing how your breasts look.  Knowing how your breasts feel.  Checking your breasts every month for changes.  Telling your doctor if you notice a change in your breasts.  Breast self-awareness allows you to notice a breast problem early while it is still small. How to do a breast self-exam One way to learn what is normal for your breasts and to check for changes is to do a  breast self-exam. To do a breast self-exam: Look for Changes  1. Take off all the clothes above your waist. 2. Stand in front of a mirror in a room with good lighting. 3. Put your hands on your hips. 4. Push your hands down. 5. Look at your breasts and nipples in the mirror to see if one breast or nipple looks different than the other. Check to see if: ? The shape of one breast is different. ? The size of one breast is different. ? There are wrinkles, dips, and bumps in one breast and not the other. 6. Look at each breast for changes in your skin, such as: ? Redness. ? Scaly areas. 7. Look for changes in your nipples, such as: ? Liquid around the nipples. ? Bleeding. ? Dimpling. ? Redness. ? A change in where the nipples are. Feel for Changes 1. Lie on your back on the floor. 2. Feel each breast. To do this, follow these steps: ? Pick a breast to feel. ? Put the arm closest to that breast above your head. ? Use your other arm to feel the nipple area of your breast. Feel the area with the pads of your three middle fingers by making small circles with your fingers. For the first circle, press lightly. For the second circle, press harder. For the third circle, press even harder. ? Keep making circles with your fingers at the light, harder, and even harder pressures as you move down your breast. Stop when you feel your ribs. ? Move your fingers a little toward the center of your body. ? Start making circles with your fingers again, this time going up until you reach your collarbone. ? Keep  making up and down circles until you reach your armpit. Remember to keep using the three pressures. ? Feel the other breast in the same way. 3. Sit or stand in the shower or tub. 4. With soapy water on your skin, feel each breast the same way you did in step 2, when you were lying on the floor. Write Down What You Find  After doing the self-exam, write down:  What is normal for each breast.  Any  changes you find in each breast.  When you last had your period.  How often should I check my breasts? Check your breasts every month. If you are breastfeeding, the best time to check them is after you feed your baby or after you use a breast pump. If you get periods, the best time to check your breasts is 5-7 days after your period is over. When should I see my doctor? See your doctor if you notice:  A change in shape or size of your breasts or nipples.  A change in the skin of your breast or nipples, such as red or scaly skin.  Unusual fluid coming from your nipples.  A lump or thick area that was not there before.  Pain in your breasts.  Anything that concerns you.  This information is not intended to replace advice given to you by your health care provider. Make sure you discuss any questions you have with your health care provider. Document Released: 05/03/2008 Document Revised: 04/22/2016 Document Reviewed: 10/05/2015 Elsevier Interactive Patient Education  [employer].

## 2018-07-18 ENCOUNTER — Other Ambulatory Visit: Payer: Self-pay | Admitting: Family Medicine

## 2018-07-18 DIAGNOSIS — N644 Mastodynia: Secondary | ICD-10-CM

## 2018-07-18 DIAGNOSIS — Z1239 Encounter for other screening for malignant neoplasm of breast: Secondary | ICD-10-CM

## 2018-07-24 ENCOUNTER — Other Ambulatory Visit: Payer: Self-pay

## 2018-08-01 ENCOUNTER — Other Ambulatory Visit: Payer: Self-pay | Admitting: Obstetrics and Gynecology

## 2018-08-01 DIAGNOSIS — Z1231 Encounter for screening mammogram for malignant neoplasm of breast: Secondary | ICD-10-CM

## 2018-08-07 ENCOUNTER — Encounter: Payer: Self-pay | Admitting: Family Medicine

## 2018-08-07 ENCOUNTER — Ambulatory Visit: Payer: Self-pay | Admitting: Family Medicine

## 2018-08-07 VITALS — BP 138/82 | HR 74 | Temp 98.5°F | Resp 16 | Ht 59.0 in | Wt 123.0 lb

## 2018-08-07 DIAGNOSIS — I10 Essential (primary) hypertension: Secondary | ICD-10-CM

## 2018-08-07 DIAGNOSIS — Z23 Encounter for immunization: Secondary | ICD-10-CM

## 2018-08-07 DIAGNOSIS — E039 Hypothyroidism, unspecified: Secondary | ICD-10-CM

## 2018-08-07 DIAGNOSIS — F4321 Adjustment disorder with depressed mood: Secondary | ICD-10-CM

## 2018-08-07 DIAGNOSIS — L819 Disorder of pigmentation, unspecified: Secondary | ICD-10-CM

## 2018-08-07 NOTE — Progress Notes (Signed)
Chief Complaint  Patient presents with  . follow up BP    1 month    HPI   Grieving She reports that she feels better since talking to her husband about her grieving She states that she feels less stressed She reports that she does not cry as much as before  Hypertension: Patient here for follow-up of elevated blood pressure. She is exercising and is adherent to low salt diet.  Blood pressure is well controlled at home. Cardiac symptoms none. Patient denies chest pain, chest pressure/discomfort, claudication, exertional chest pressure/discomfort, irregular heart beat, lower extremity edema and near-syncope.  Cardiovascular risk factors: hypertension. Use of agents associated with hypertension: none. History of target organ damage: none.  BP Readings from Last 3 Encounters:  08/07/18 138/82  07/05/18 131/74  08/08/17 (!) 144/86    She gets her Levothyroxine from Endocrinology She states that her levels have been good She is taking levothyroxine  Lab Results  Component Value Date   TSH 5.16 (H) 07/27/2016     4 review of systems  Past Medical History:  Diagnosis Date  . Hyperlipidemia   . Hypertension   . Thyroid disease     Current Outpatient Medications  Medication Sig Dispense Refill  . levothyroxine (SYNTHROID, LEVOTHROID) 112 MCG tablet Take 137 mcg by mouth daily before breakfast.      No current facility-administered medications for this visit.     Allergies: No Known Allergies  Past Surgical History:  Procedure Laterality Date  . CESAREAN SECTION      Social History   Socioeconomic History  . Marital status: Married    Spouse name: Not on file  . Number of children: Not on file  . Years of education: 61  . Highest education level: Not on file  Occupational History  . 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 Financial resource strain: Not on file  . Food insecurity:    Worry: Not on file    Inability: Not on file  . Transportation  needs:    Medical: Not on file    Non-medical: Not on file  Tobacco Use  . Smoking status: Never Smoker  . Smokeless tobacco: Never Used  Substance and Sexual Activity  . Alcohol use: Yes    Comment: special occasions  . Drug use: No  . Sexual activity: Yes    Partners: Male    Birth control/protection: Condom  Lifestyle  . Physical activity:    Days per week: Not on file    Minutes per session: Not on file  . Stress: Not on file  Relationships  . Social connections:    Talks on phone: Not on file    Gets together: Not on file    Attends religious service: Not on file    Active member of club or organization: Not on file    Attends meetings of clubs or organizations: Not on file    Relationship status: Not on file  Other Topics Concern  . Not on file  Social History Narrative   Pt. Exercises regularly    Family History  Problem Relation Age of Onset  . Depression Mother   . Hypertension Mother   . Hypertension Father   . Hyperlipidemia Father   . Depression Brother   . Hypertension Maternal Grandfather      ROS Review of Systems See HPI Constitution: No fevers or chills No malaise No diaphoresis Skin: No rash or itching Eyes: no blurry vision, no double vision  GU: no dysuria or hematuria Neuro: no dizziness or headaches all others reviewed and negative   Objective: Vitals:   08/07/18 1135  BP: 138/82  Pulse: 74  Resp: 16  Temp: 98.5 F (36.9 C)  TempSrc: Oral  SpO2: 100%  Weight: 123 lb (55.8 kg)  Height: 4\' 11"  (1.499 m)    Physical Exam  Constitutional: She appears well-developed and well-nourished.  HENT:  Head: Normocephalic and atraumatic.  Eyes: Conjunctivae and EOM are normal.  Cardiovascular: Normal rate, regular rhythm and normal heart sounds.  No murmur heard. Pulmonary/Chest: Effort normal and breath sounds normal.  Skin: Skin is warm. Capillary refill takes less than 2 seconds.  Psychiatric: She has a normal mood and affect.  Her behavior is normal. Judgment and thought content normal.   Skin - numerous moles, hyperpigmentation and facial and neck warts Assessment and Plan Shelsy was seen today for follow up bp.  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

## 2018-08-07 NOTE — Patient Instructions (Addendum)
   If you have lab work done today you will be contacted with your lab results within the next 2 weeks.  If you have not heard from us then please contact us. The fastest way to get your results is to register for My Chart.   IF you received an x-ray today, you will receive an invoice from [facility]. Please contact [facility] at [phone] with questions or concerns regarding your invoice.   IF you received labwork today, you will receive an invoice from [facility]. Please contact [facility] at [phone] with questions or concerns regarding your invoice.   Our billing staff will not be able to assist you with questions regarding bills from these companies.  You will be contacted with the lab results as soon as they are available. The fastest way to get your results is to activate your My Chart account. Instructions are located on the last page of this paperwork. If you have not heard from us regarding the results in 2 weeks, please contact this office.      DASH Eating Plan DASH stands for "Dietary Approaches to Stop Hypertension." The DASH eating plan is a healthy eating plan that has been shown to reduce high blood pressure (hypertension). It may also reduce your risk for type 2 diabetes, heart disease, and stroke. The DASH eating plan may also help with weight loss. What are tips for following this plan? General guidelines  Avoid eating more than 2,300 mg (milligrams) of salt (sodium) a day. If you have hypertension, you may need to reduce your sodium intake to 1,500 mg a day.  Limit alcohol intake to no more than 1 drink a day for nonpregnant women and 2 drinks a day for men. One drink equals 12 oz of beer, 5 oz of wine, or 1 oz of hard liquor.  Work with your health care provider to maintain a healthy body weight or to lose weight. Ask what an ideal weight is for you.  Get at least 30 minutes of exercise that causes your heart to beat faster (aerobic  exercise) most days of the week. Activities may include walking, swimming, or biking.  Work with your health care provider or diet and nutrition specialist (dietitian) to adjust your eating plan to your individual calorie needs. Reading food labels  Check food labels for the amount of sodium per serving. Choose foods with less than 5 percent of the Daily Value of sodium. Generally, foods with less than 300 mg of sodium per serving fit into this eating plan.  To find whole grains, look for the word "whole" as the first word in the ingredient list. Shopping  Buy products labeled as "low-sodium" or "no salt added."  Buy fresh foods. Avoid canned foods and premade or frozen meals. Cooking  Avoid adding salt when cooking. Use salt-free seasonings or herbs instead of table salt or sea salt. Check with your health care provider or pharmacist before using salt substitutes.  Do not fry foods. Cook foods using healthy methods such as baking, boiling, grilling, and broiling instead.  Cook with heart-healthy oils, such as olive, canola, soybean, or sunflower oil. Meal planning   Eat a balanced diet that includes: ? 5 or more servings of fruits and vegetables each day. At each meal, try to fill half of your plate with fruits and vegetables. ? Up to 6-8 servings of whole grains each day. ? Less than 6 oz of lean meat, poultry, or fish each day. A 3-oz serving   of meat is about the same size as a deck of cards. One egg equals 1 oz. ? 2 servings of low-fat dairy each day. ? A serving of nuts, seeds, or beans 5 times each week. ? Heart-healthy fats. Healthy fats called Omega-3 fatty acids are found in foods such as flaxseeds and coldwater fish, like sardines, salmon, and mackerel.  Limit how much you eat of the following: ? Canned or prepackaged foods. ? Food that is high in trans fat, such as fried foods. ? Food that is high in saturated fat, such as fatty meat. ? Sweets, desserts, sugary drinks,  and other foods with added sugar. ? Full-fat dairy products.  Do not salt foods before eating.  Try to eat at least 2 vegetarian meals each week.  Eat more home-cooked food and less restaurant, buffet, and fast food.  When eating at a restaurant, ask that your food be prepared with less salt or no salt, if possible. What foods are recommended? The items listed may not be a complete list. Talk with your dietitian about what dietary choices are best for you. Grains Whole-grain or whole-wheat bread. Whole-grain or whole-wheat pasta. Brown rice. Oatmeal. Quinoa. Bulgur. Whole-grain and low-sodium cereals. Pita bread. Low-fat, low-sodium crackers. Whole-wheat flour tortillas. Vegetables Fresh or frozen vegetables (raw, steamed, roasted, or grilled). Low-sodium or reduced-sodium tomato and vegetable juice. Low-sodium or reduced-sodium tomato sauce and tomato paste. Low-sodium or reduced-sodium canned vegetables. Fruits All fresh, dried, or frozen fruit. Canned fruit in natural juice (without added sugar). Meat and other protein foods Skinless chicken or turkey. Ground chicken or turkey. Pork with fat trimmed off. Fish and seafood. Egg whites. Dried beans, peas, or lentils. Unsalted nuts, nut butters, and seeds. Unsalted canned beans. Lean cuts of beef with fat trimmed off. Low-sodium, lean deli meat. Dairy Low-fat (1%) or fat-free (skim) milk. Fat-free, low-fat, or reduced-fat cheeses. Nonfat, low-sodium ricotta or cottage cheese. Low-fat or nonfat yogurt. Low-fat, low-sodium cheese. Fats and oils Soft margarine without trans fats. Vegetable oil. Low-fat, reduced-fat, or light mayonnaise and salad dressings (reduced-sodium). Canola, safflower, olive, soybean, and sunflower oils. Avocado. Seasoning and other foods Herbs. Spices. Seasoning mixes without salt. Unsalted popcorn and pretzels. Fat-free sweets. What foods are not recommended? The items listed may not be a complete list. Talk with your  dietitian about what dietary choices are best for you. Grains Baked goods made with fat, such as croissants, muffins, or some breads. Dry pasta or rice meal packs. Vegetables Creamed or fried vegetables. Vegetables in a cheese sauce. Regular canned vegetables (not low-sodium or reduced-sodium). Regular canned tomato sauce and paste (not low-sodium or reduced-sodium). Regular tomato and vegetable juice (not low-sodium or reduced-sodium). Pickles. Olives. Fruits Canned fruit in a light or heavy syrup. Fried fruit. Fruit in cream or butter sauce. Meat and other protein foods Fatty cuts of meat. Ribs. Fried meat. Bacon. Sausage. Bologna and other processed lunch meats. Salami. Fatback. Hotdogs. Bratwurst. Salted nuts and seeds. Canned beans with added salt. Canned or smoked fish. Whole eggs or egg yolks. Chicken or turkey with skin. Dairy Whole or 2% milk, cream, and half-and-half. Whole or full-fat cream cheese. Whole-fat or sweetened yogurt. Full-fat cheese. Nondairy creamers. Whipped toppings. Processed cheese and cheese spreads. Fats and oils Butter. Stick margarine. Lard. Shortening. Ghee. Bacon fat. Tropical oils, such as coconut, palm kernel, or palm oil. Seasoning and other foods Salted popcorn and pretzels. Onion salt, garlic salt, seasoned salt, table salt, and sea salt. Worcestershire sauce. Tartar sauce. Barbecue sauce.   Teriyaki sauce. Soy sauce, including reduced-sodium. Steak sauce. Canned and packaged gravies. Fish sauce. Oyster sauce. Cocktail sauce. Horseradish that you find on the shelf. Ketchup. Mustard. Meat flavorings and tenderizers. Bouillon cubes. Hot sauce and Tabasco sauce. Premade or packaged marinades. Premade or packaged taco seasonings. Relishes. Regular salad dressings. Where to find more information:  National Heart, Lung, and Blood Institute: www.nhlbi.nih.gov  American Heart Association: www.heart.org Summary  The DASH eating plan is a healthy eating plan that has  been shown to reduce high blood pressure (hypertension). It may also reduce your risk for type 2 diabetes, heart disease, and stroke.  With the DASH eating plan, you should limit salt (sodium) intake to 2,300 mg a day. If you have hypertension, you may need to reduce your sodium intake to 1,500 mg a day.  When on the DASH eating plan, aim to eat more fresh fruits and vegetables, whole grains, lean proteins, low-fat dairy, and heart-healthy fats.  Work with your health care provider or diet and nutrition specialist (dietitian) to adjust your eating plan to your individual calorie needs. This information is not intended to replace advice given to you by your health care provider. Make sure you discuss any questions you have with your health care provider. Document Released: 11/04/2011 Document Revised: 11/08/2016 Document Reviewed: 11/08/2016 Elsevier Interactive Patient Education  2018 Elsevier Inc.  

## 2018-10-05 ENCOUNTER — Ambulatory Visit (HOSPITAL_COMMUNITY)
Admission: RE | Admit: 2018-10-05 | Discharge: 2018-10-05 | Disposition: A | Discharge status: Home / Self Care | Payer: Self-pay | Source: Ambulatory Visit | Attending: Obstetrics and Gynecology | Admitting: Obstetrics and Gynecology

## 2018-10-05 ENCOUNTER — Ambulatory Visit
Admission: RE | Admit: 2018-10-05 | Discharge: 2018-10-05 | Disposition: A | Discharge status: Home / Self Care | Payer: No Typology Code available for payment source | Source: Ambulatory Visit | Attending: Obstetrics and Gynecology | Admitting: Obstetrics and Gynecology

## 2018-10-05 ENCOUNTER — Encounter (HOSPITAL_COMMUNITY): Payer: Self-pay

## 2018-10-05 VITALS — BP 140/86 | Wt 127.0 lb

## 2018-10-05 DIAGNOSIS — Z1231 Encounter for screening mammogram for malignant neoplasm of breast: Secondary | ICD-10-CM

## 2018-10-05 DIAGNOSIS — Z1239 Encounter for other screening for malignant neoplasm of breast: Secondary | ICD-10-CM

## 2018-10-05 NOTE — Patient Instructions (Signed)
Explained breast self awareness with Teresa Michael. Patient did not need a Pap smear today due to last Pap smear and HPV typing was 07/27/2016. Let her know BCCCP will cover Pap smears and HPV typing every 5 years unless has a history of abnormal Pap smears. Referred patient to the [facility] for a screening mammogram. Appointment scheduled for Thursday, October 05, 2018 at 1240. Patient aware of appointment and will be there. Let patient know the Breast Center will follow up with her within the next couple weeks with results of mammogram by letter or phone. Teresa Michael verbalized understanding.  Japneet Staggs, Kathaleen Maser, RN 11:50 AM

## 2018-10-05 NOTE — Progress Notes (Signed)
Patient stated she had breast pain and lumps in August when she had her visit at [facility]. Patient stated the pain has resolved and thinks she still feels the lumps.  Pap Smear: Pap smear not completed today. Last Pap smear was 07/27/2016 and normal with negative HPV. Per patient has no history of an abnormal Pap smear. Last Pap smear result is in Epic.  Physical exam: Breasts Breasts symmetrical. No skin abnormalities bilateral breasts. No nipple retraction bilateral breasts. No nipple discharge bilateral breasts. No lymphadenopathy. No lumps palpated bilateral breasts. No complaints of pain or tenderness on exam. Referred patient to the [facility] for a screening mammogram. Appointment scheduled for Thursday, October 05, 2018 at 1240.        Pelvic/Bimanual No Pap smear completed today since last Pap smear and HPV typing was 07/27/2016. Pap smear not indicated per BCCCP guidelines.   Smoking History: Patient has never smoked.  Patient Navigation: Patient education provided. Access to services provided for patient through [facility] program. Spanish interpreter provided.   Breast and Cervical Cancer Risk Assessment: Patient has no family history of breast cancer, known genetic mutations, or radiation treatment to the chest before age 52. Patient has no history of cervical dysplasia, immunocompromised, or DES exposure in-utero.  Risk Assessment    Risk Scores      10/05/2018   Last edited by: Lynnell Dike, LPN   5-year risk: 0.5 %   Lifetime risk: 7 %         Used Spanish interpreter Natale Lay from [place].

## 2018-10-06 ENCOUNTER — Encounter (HOSPITAL_COMMUNITY): Payer: Self-pay | Admitting: *Deleted

## 2018-10-09 ENCOUNTER — Other Ambulatory Visit (HOSPITAL_COMMUNITY): Payer: Self-pay | Admitting: *Deleted

## 2018-10-09 DIAGNOSIS — Z0000 Encounter for general adult medical examination without abnormal findings: Secondary | ICD-10-CM

## 2018-10-13 ENCOUNTER — Inpatient Hospital Stay: Payer: No Typology Code available for payment source

## 2018-10-13 ENCOUNTER — Inpatient Hospital Stay: Payer: No Typology Code available for payment source | Attending: Obstetrics and Gynecology | Admitting: *Deleted

## 2018-10-13 VITALS — BP 120/74 | Ht 60.0 in | Wt 128.0 lb

## 2018-10-13 DIAGNOSIS — Z0000 Encounter for general adult medical examination without abnormal findings: Secondary | ICD-10-CM

## 2018-10-13 LAB — HEMOGLOBIN A1C
Hgb A1c MFr Bld: 5.4 % (ref 4.8–5.6)
Mean Plasma Glucose: 108.28 mg/dL

## 2018-10-13 LAB — LIPID PANEL
Cholesterol: 222 mg/dL — ABNORMAL HIGH (ref 0–200)
HDL: 79 mg/dL (ref >40)
LDL CALC: 123 mg/dL — AB (ref 0–99)
TRIGLYCERIDES: 100 mg/dL (ref <150)
Total CHOL/HDL Ratio: 2.8 RATIO
VLDL: 20 mg/dL (ref 0–40)

## 2018-10-13 NOTE — Addendum Note (Signed)
Addended by: Donia GuilesPLEASANT, Mamta Rimmer on: 10/13/2018 09:09 AM   Modules accepted: Orders

## 2018-10-13 NOTE — Progress Notes (Signed)
[facility] initial screening  Clinical Measurement:  Height: 60in Weight: 128lb Blood Pressure:  128/72 Blood Pressure #2: 120/74  Fasting Labs Drawn Today, will review with patient when they result.  Medical History:  Patient states that she has not been diagnosed with high cholesterol, high blood pressure, diabetes or heart disease.  Medications:  Patients states she is not taking any medications for high cholesterol, high blood pressure or diabetes.  She is not taking aspirin daily to prevent heart attack or stroke.    Blood pressure, self measurement:  Patients states she does measure blood pressure at home.    Nutrition:  Patient states she eats 1 cup of fruit and 1 cup of vegetables in an average day.  Patient states she does  eat fish regularly, she eats about a half a serving of whole grains daily. She drinks less than 36 ounces of beverages with added sugar weekly.  She is currently watching her sodium intake.  She has  1  drink containing alcohol in the last seven days.    Physical activity:  Patient states that she gets 450 minutes of moderate exercise in a week.  She gets 100 minutes of vigorous exercise per week.    Smoking status:  Patient states she has never smoked and is not around any smokers.    Quality of life:  Patient states that she has had 0 bad physical days out of the last 30 days. In the last 2 weeks, she has had 0 days that she has felt down or depressed. She has had 0 days in the last 2 weeks that she has had little interest or pleasure in doing things.  Risk reduction and counseling:  Patient states she wants to  increase fruit and vegetable intake.  I encouraged her to continue with current exercise regimen and increase vegetable and fruit intake.  Navigation:  I will notify patient of lab results.  Patient is aware of 2 more health coaching sessions and a follow up.

## 2018-10-13 NOTE — Addendum Note (Signed)
Addended by: Karlin Binion on: 10/13/2018 09:09 AM   Modules accepted: Orders  

## 2018-10-16 ENCOUNTER — Telehealth (HOSPITAL_COMMUNITY): Payer: Self-pay | Admitting: *Deleted

## 2018-10-16 NOTE — Telephone Encounter (Signed)
Health coaching 2  Labs-cholesterol 222, LDL cholesterol 123, triglycerides 100, HDL cholesterol  79, hemoglobin A!C 5.4, mean plasma glucose 108.28   Patient is aware and understands these lab results.  Goals- Patient  States that she exercises 450 minutes weekly.  I encouraged patient to continue with this regimen.  Patient states that she eats 1 serving each of fruits and vegetables.  I encouraged patient to increase her fruit and vegetable consumption to 5 daily.  I also encouraged patient to eat more heart healthy fish such as salmon, tuna and mackerel.   Navigation:  Patient is aware of 1 more health coaching sessions and a follow up.

## 2018-11-07 ENCOUNTER — Other Ambulatory Visit: Payer: Self-pay

## 2018-11-07 ENCOUNTER — Encounter: Payer: Self-pay | Admitting: Family Medicine

## 2018-11-07 ENCOUNTER — Ambulatory Visit: Payer: Self-pay | Admitting: Family Medicine

## 2018-11-07 ENCOUNTER — Ambulatory Visit (INDEPENDENT_AMBULATORY_CARE_PROVIDER_SITE_OTHER): Payer: Self-pay

## 2018-11-07 VITALS — BP 154/90 | HR 60 | Temp 97.6°F | Ht 60.0 in | Wt 128.0 lb

## 2018-11-07 DIAGNOSIS — M25562 Pain in left knee: Secondary | ICD-10-CM

## 2018-11-07 NOTE — Progress Notes (Signed)
12/10/201910:26 AM  Teresa Michael 10/04/1976, 42 y.o. female [id number]  Chief Complaint  Patient presents with  . Fall    this past Saturday fell while carring dog gate, may have tripped on something. Fell on metal portion of the bed. hit under the neck, jaw area and both knees. Left knee has the most pain.    HPI:   Patient is a 42 y.o. female with past medical history significant for hypothyroidism who presents today for fall  4 days ago she tripped while carrying a dog gate She slammed down on her knees over metal bar Gate hit hard under jaw/neck Having pain but able to eat and swallow Her left knee is painful, swollen Has been able to walk on it Denies any locking or giving out Most comfortable with it extended Has been taking ibuprofen Denies any previous issues with this knee She would like an xray  Fall Risk  11/07/2018 07/05/2018 07/05/2018 08/08/2017 07/23/2017  Falls in the past year? 1 No No No No  Number falls in past yr: 0 - - - -  Injury with Fall? 1 - - - -     Dep[facility] 2/9 11/07/2018 08/07/2018 07/05/2018  Decreased Interest 0 0 1  Down, Depressed, Hopeless 0 0 1  PHQ - 2 Score 0 0 2  Altered sleeping - - 0  Tired, decreased energy - - 3  Change in appetite - - 1  Feeling bad or failure about yourself  - - 1  Trouble concentrating - - 3  Moving slowly or fidgety/restless - - 1  Suicidal thoughts - - 0  PHQ-9 Score - - 11  Difficult doing work/chores - - Somewhat difficult    No Known Allergies  Prior to Admission medications   Medication Sig Start Date End Date Taking? Authorizing Provider  levothyroxine (SYNTHROID, LEVOTHROID) 112 MCG tablet Take 137 mcg by mouth daily before breakfast.    Yes [provider]  Multiple Vitamin (MULTIVITAMIN) tablet Take 1 tablet by mouth daily.   Yes [provider]    Past Medical History:  Diagnosis Date  . Hyperlipidemia   . Hypertension   . Thyroid disease     Past  Surgical History:  Procedure Laterality Date  . CESAREAN SECTION     2 previous    Social History   Tobacco Use  . Smoking status: Never Smoker  . Smokeless tobacco: Never Used  Substance Use Topics  . Alcohol use: Yes    Comment: special occasions    Family History  Problem Relation Age of Onset  . Depression Mother   . Hypertension Mother   . Hypertension Father   . Hyperlipidemia Father   . Depression Brother   . Hypertension Maternal Grandfather     ROS Per hpi  OBJECTIVE:  Blood pressure (!) 142/84, pulse 60, temperature 97.6 F (36.4 C), temperature source Oral, height 5' (1.524 m), weight 128 lb (58.1 kg), last menstrual period 11/06/2018, SpO2 100 %. Body mass index is 25 kg/m.   Physical Exam  Constitutional: She is oriented to person, place, and time. She appears well-developed and well-nourished.  HENT:  Head: Normocephalic and atraumatic.  Mouth/Throat: Mucous membranes are normal. No posterior oropharyngeal edema or posterior oropharyngeal erythema.  Eyes: Pupils are equal, round, and reactive to light. Conjunctivae and EOM are normal. No scleral icterus.  Neck: Neck supple. No edema and normal range of motion present.    Pulmonary/Chest: Effort normal and breath sounds  normal. No respiratory distress.  Musculoskeletal:       Left knee: She exhibits swelling and ecchymosis. She exhibits normal range of motion, no deformity, normal alignment, no LCL laxity, normal patellar mobility, normal meniscus and no MCL laxity. Tenderness found.  Neurological: She is alert and oriented to person, place, and time.  Skin: Skin is warm and dry.  Psychiatric: She has a normal mood and affect.  Nursing note and vitals reviewed.    Dg Knee Complete 4 Views Left  Result Date: 11/07/2018 CLINICAL DATA:  LEFT knee pain and swelling related to a recent fall. Initial encounter. EXAM: LEFT KNEE - COMPLETE 4+ VIEW COMPARISON:  None. FINDINGS: No evidence of acute fracture  or dislocation. Well-preserved joint spaces. Well-preserved bone mineral density. No intrinsic osseous abnormality. No visible joint effusion. IMPRESSION: Normal examination. Electronically Signed   By: Hulan Saas M.D.   On: 11/07/2018 10:51     ASSESSMENT and PLAN  1. Acute pain of left knee Discussed cont with supportive measures, rice therapy. Patient educational handout given. RTC precautions reviewed - DG Knee Complete 4 Views Left; Future - Care order/instruction:  Return if symptoms worsen or fail to improve.    Myles Lipps, MD Primary Care at [facility] : [facility] [address] [place], [place] [address] Ph.  [phone] Fax [phone]

## 2018-11-07 NOTE — Patient Instructions (Addendum)
Dolor de rodilla (Knee Pain) El dolor de rodilla es un problema frecuente y puede tener muchas causas. A menudo desaparece si se siguen las instrucciones del mdico para el cuidado Facilities manageren el hogar. El tratamiento del dolor continuo depender de su causa. Si el dolor persiste, tal vez haya que realizar ms estudios para Scientist, forensicdiagnosticar la afeccin, los cuales pueden incluir radiografas u otros estudios de diagnstico por imgenes de la rodilla. CUIDADOS EN EL HOGAR  Tome los medicamentos solamente como se lo haya indicado el mdico.  Mantenga la rodilla en reposo y en alto (elevada) mientras est descansando.  No haga cosas que le causen dolor o que lo intensifiquen.  Evite las Ball Corporationactividades en las que ambos pies se separan del suelo al mismo tiempo, por ejemplo, correr, saltar la soga o hacer saltos de tijera.  Aplique hielo sobre la zona de la rodilla: ? Ponga el hielo en una bolsa plstica. ? Coloque una FirstEnergy Corptoalla entre la piel y la bolsa de hielo. ? Coloque el hielo durante 20 minutos, 2 a 3 veces por da.  Pregntele al mdico si debe usar una Neurosurgeonrodillera elstica.  Duerma con una almohada debajo de la rodilla.  Baje de peso si es necesario. El sobrepeso puede aumentar el dolor de rodilla.  No consuma ningn producto que contenga tabaco, lo que incluye cigarrillos, tabaco de Theatre managermascar o Administrator, Civil Servicecigarrillos electrnicos. Si necesita ayuda para dejar de fumar, consulte al mdico. Fumar puede retrasar la curacin de cualquier problema que tenga en el hueso y Nurse, learning disabilityla articulacin.  SOLICITE AYUDA SI:  El dolor de rodilla no desaparece, cambia o empeora.  Tiene fiebre junto con dolor de rodilla.  La rodilla le falla o se le queda trabada.  La rodilla est ms hinchada.  SOLICITE AYUDA DE INMEDIATO SI:  La rodilla est caliente al tacto.  Tiene dolor en el pecho o dificultad para respirar.  Esta informacin no tiene Theme park managercomo fin reemplazar el consejo del mdico. Asegrese de hacerle al mdico cualquier  pregunta que tenga. Document Released: 06/12/2014 Document Revised: 06/12/2014 Document Reviewed: 01/16/2014 Elsevier Interactive Patient Education  [employer]2017 Elsevier Inc.     If you have lab work done today you will be contacted with your lab results within the next 2 weeks.  If you have not heard from us then please contact us. The fastest way to get your results is to register for My Chart.   IF you received an x-ray today, you will [facility]. Please contact [facility] at [phone] with questions or concerns regarding your invoice.   IF you received labwork today, you will receive an invoice from [facility]. Please contact [facility] at [phone] with questions or concerns regarding your invoice.   Our billing staff will not be able to assist you with questions regarding bills from these companies.  You will be contacted with the lab results as soon as they are available. The fastest way to get your results is to activate your My Chart account. Instructions are located on the last page of this paperwork. If you have not heard from us regarding the results in 2 weeks, please contact this office.

## 2018-11-15 ENCOUNTER — Telehealth: Payer: Self-pay | Admitting: Family Medicine

## 2018-11-15 NOTE — Telephone Encounter (Signed)
Called and spoke with pt regarding her appt with Dr. Creta LevinStallings on 02/07/19. Due to provider's schedule change, I was able to reschedule her for 02/08/19 at 9:00 AM. I advised pt of time, building and late policy. Pt acknowledged.

## 2018-12-04 ENCOUNTER — Ambulatory Visit: Payer: Self-pay | Admitting: Family Medicine

## 2018-12-04 ENCOUNTER — Encounter: Payer: Self-pay | Admitting: Family Medicine

## 2018-12-04 ENCOUNTER — Other Ambulatory Visit: Payer: Self-pay

## 2018-12-04 VITALS — BP 152/98 | HR 68 | Temp 98.2°F | Resp 18 | Ht 59.65 in | Wt 125.4 lb

## 2018-12-04 DIAGNOSIS — I10 Essential (primary) hypertension: Secondary | ICD-10-CM

## 2018-12-04 DIAGNOSIS — R52 Pain, unspecified: Secondary | ICD-10-CM

## 2018-12-04 DIAGNOSIS — J029 Acute pharyngitis, unspecified: Secondary | ICD-10-CM

## 2018-12-04 DIAGNOSIS — R6889 Other general symptoms and signs: Secondary | ICD-10-CM

## 2018-12-04 LAB — POCT RAPID STREP A (OFFICE): RAPID STREP A SCREEN: NEGATIVE

## 2018-12-04 LAB — POCT INFLUENZA A/B
INFLUENZA A, POC: NEGATIVE
INFLUENZA B, POC: NEGATIVE

## 2018-12-04 MED ORDER — LOSARTAN POTASSIUM-HCTZ 50-12.5 MG PO TABS: 1.0000 | ORAL_TABLET | Freq: Every day | ORAL | 1 refills | Status: DC

## 2018-12-04 MED ORDER — FLUTICASONE PROPIONATE 50 MCG/ACT NA SUSP: 2.0000 | Freq: Every day | NASAL | 0 refills | Status: AC

## 2018-12-04 MED ORDER — HYDROCODONE-HOMATROPINE 5-1.5 MG/5ML PO SYRP: 5.0000 mL | ORAL_SOLUTION | Freq: Three times a day (TID) | ORAL | 0 refills | Status: DC | PRN

## 2018-12-04 NOTE — Progress Notes (Signed)
Patient ID: Teresa Michael, female    DOB: 1976-03-28  Age: 43 y.o. MRN: [id number]  Chief Complaint  Patient presents with  . Cough    X 1 day  . Sore Throat    X 3 days  . Generalized Body Aches    X 1 day    Subjective:   43 year old lady who has been sick with a sore throat starting 3 days ago, then yesterday got worse with myalgias, headache, coughing.  The cough bothers her in the night a lot.  She has coughed up some cloudy mucus.  She does not smoke.  She did have a flu vaccine this year.  She is usually a healthy person.  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  No one else at home is badly ill.   Has had some eustachian tube dysfunction symptoms on the right.  Current allergies, medications, problem list, past/family and social histories reviewed.  Objective:  BP (!) 152/98   Pulse 68   Temp 98.2 F (36.8 C) (Oral)   Resp 18   Ht 4' 11.65" (1.515 m)   Wt 125 lb 6.4 oz (56.9 kg)   LMP 12/01/2018 (Approximate)   SpO2 100%   BMI 24.78 kg/m   No major acute distress.  TMs are normal.  Her throat is not erythematous.  Neck supple without significant nodes.  Chest is clear to auscultation except for minimal end expiratory wheeze on forced expiration.  Heart regular without murmur.  Results for orders placed or performed in visit on 12/04/18  POCT rapid strep A  Result Value Ref Range   Rapid Strep A Screen Negative Negative  POCT Influenza A/B  Result Value Ref Range   Influenza A, POC Negative Negative   Influenza B, POC Negative Negative     Assessment & Plan:   Assessment: 1. Flu-like symptoms   2. Generalized body aches   3. Sore throat       Plan: See instructions.  I noted that her blood pressure was high.  She says that it has been high over the last several years and she was on some metoprolol for a while but her thyroid doctor said things were good and she stopped taking it.  She does have a blood pressure cuff at home.  She does have a  little history of depression, sees a counselor, therefore I think it would be better to avoid a beta-blocker.  See instructions.  Orders Placed This Encounter  Procedures  . POCT rapid strep A  . POCT Influenza A/B    Meds ordered this encounter  Medications  . HYDROcodone-homatropine (HYCODAN) 5-1.5 MG/5ML syrup    Sig: Take 5 mLs by mouth every 8 (eight) hours as needed for cough.    Dispense:  120 mL    Refill:  0  . fluticasone (FLONASE) 50 MCG/ACT nasal spray    Sig: Place 2 sprays into both nostrils daily.    Dispense:  16 g    Refill:  0         Patient Instructions   Drink plenty of fluids and get enough rest  Take Tylenol 500 mg 2 pills 3 times daily and/or ibuprofen 200 mg 3 pills 3 times daily as needed for headache and body aches and fevers.  Use the Hycodan cough syrup 1 teaspoon every 4-6 hours as needed for bad cough especially at night..  This is sedating.  It should help you sleep.  You can take  over-the-counter Mucinex DM in the daytime if needed for the cough and to thin the secretions.  If necessary take an over-the-counter antihistamine decongestant such as Claritin-D or Allegra-D or Zyrtec-D or an equivalent generic for head congestion.  At this point this appears to be a viral infection and antibiotics are not helpful.  Usually this takes 7 to 10 days to run its course, but the cough can last even longer.  Return if getting worse, such as worsening cough, shortness of breath, high fevers, or generally getting worse.  Your probably infectious sort stay clear of your family as much as possible.    If you have lab work done today you will be contacted with your lab results within the next 2 weeks.  If you have not heard from us then please contact us. The fastest way to get your results is to register for My Chart.   IF you received an x-ray today, you will receive an invoice from [facility]. Please contact [facility] at  [phone] with questions or concerns regarding your invoice.   IF you received labwork today, you will receive an invoice from [facility]. Please contact [facility] at [phone] with questions or concerns regarding your invoice.   Our billing staff will not be able to assist you with questions regarding bills from these companies.  You will be contacted with the lab results as soon as they are available. The fastest way to get your results is to activate your My Chart account. Instructions are located on the last page of this paperwork. If you have not heard from us regarding the results in 2 weeks, please contact this office.        No follow-ups on file.   Janace Hoardavid , MD 12/04/2018

## 2018-12-04 NOTE — Patient Instructions (Addendum)
Drink plenty of fluids and get enough rest  Take Tylenol 500 mg 2 pills 3 times daily and/or ibuprofen 200 mg 3 pills 3 times daily as needed for headache and body aches and fevers.  Use the Hycodan cough syrup 1 teaspoon every 4-6 hours as needed for bad cough especially at night..  This is sedating.  It should help you sleep.  You can take over-the-counter Mucinex DM in the daytime if needed for the cough and to thin the secretions.  If necessary take an over-the-counter antihistamine decongestant such as Claritin-D or Allegra-D or Zyrtec-D or an equivalent generic for head congestion.  At this point this appears to be a viral infection and antibiotics are not helpful.  Usually this takes 7 to 10 days to run its course, but the cough can last even longer.  Return if getting worse, such as worsening cough, shortness of breath, high fevers, or generally getting worse.  Your probably infectious sort stay clear of your family as much as possible.  Take losartan HCT 50/12.51 daily for your blood pressure.  Recommend following up on that in about 3 months.  Also monitor your blood pressure at home.  Your goal should be less than 140/90 consistently, with a best blood pressure being down around 120/70    If you have lab work done today you will be contacted with your lab results within the next 2 weeks.  If you have not heard from [place] then please contact us. The fastest way to get your results is to register for My Chart.   IF you received an x-ray today, you will receive an invoice from [facility]. Please contact [facility] at [phone] with questions or concerns regarding your invoice.   IF you received labwork today, you will receive an invoice from [place]. Please contact [facility] at [phone] with questions or concerns regarding your invoice.   Our billing staff will not be able to assist you with questions regarding bills from these companies.  You will be  contacted with the lab results as soon as they are available. The fastest way to get your results is to activate your My Chart account. Instructions are located on the last page of this paperwork. If you have not heard from [place] regarding the results in 2 weeks, please contact this office.

## 2019-01-05 ENCOUNTER — Telehealth: Payer: Self-pay | Admitting: *Deleted

## 2019-01-05 NOTE — Telephone Encounter (Signed)
Health Coaching 3  Spanish interpreter- Ovidio Kin, [employer]   Goals-Patient states that she exercises daily two hours and does weight lifting.  I encouraged patient to continue with this exercise routine.  Patient states that she eating more fruits and vegetables and states that has lost 12 pounds.  Patient states that she is cooking with olive oil and eating less red meat and eating more chicken and fish.  Navigation:  Patient is aware of a in person  follow up session.  Time- 10 minutes

## 2019-02-07 ENCOUNTER — Ambulatory Visit: Payer: Self-pay | Admitting: Family Medicine

## 2019-02-08 ENCOUNTER — Ambulatory Visit: Payer: Self-pay | Admitting: Family Medicine

## 2019-03-07 ENCOUNTER — Ambulatory Visit: Payer: Self-pay | Admitting: Family Medicine

## 2019-05-07 ENCOUNTER — Telehealth: Payer: Self-pay

## 2019-05-07 NOTE — Telephone Encounter (Signed)
Left message with patient via interpreter Rudene Anda to call back and schedule Follow-up appointment with the [facility] program. Left name and number for her to call back.

## 2019-05-09 ENCOUNTER — Other Ambulatory Visit: Payer: Self-pay

## 2019-05-09 ENCOUNTER — Ambulatory Visit: Payer: Self-pay | Admitting: Family Medicine

## 2019-05-09 ENCOUNTER — Encounter: Payer: Self-pay | Admitting: Family Medicine

## 2019-05-09 VITALS — BP 122/75 | HR 85 | Temp 98.6°F | Ht <= 58 in | Wt 128.9 lb

## 2019-05-09 DIAGNOSIS — I10 Essential (primary) hypertension: Secondary | ICD-10-CM

## 2019-05-09 DIAGNOSIS — L500 Allergic urticaria: Secondary | ICD-10-CM

## 2019-05-09 LAB — POCT URINALYSIS DIP (MANUAL ENTRY)
Bilirubin, UA: NEGATIVE
Glucose, UA: NEGATIVE mg/dL
Ketones, POC UA: NEGATIVE mg/dL
Nitrite, UA: NEGATIVE
Protein Ur, POC: NEGATIVE mg/dL
Spec Grav, UA: 1.02 (ref 1.010–1.025)
Urobilinogen, UA: 0.2 E.U./dL
pH, UA: 5.5 (ref 5.0–8.0)

## 2019-05-09 MED ORDER — LOSARTAN POTASSIUM-HCTZ 50-12.5 MG PO TABS: 1.0000 | ORAL_TABLET | Freq: Every day | ORAL | 5 refills | Status: DC

## 2019-05-09 MED ORDER — HYDROXYZINE HCL 10 MG PO TABS: ORAL_TABLET | ORAL | 3 refills | Status: DC

## 2019-05-09 NOTE — Progress Notes (Signed)
Acute Office Visit  Subjective:    Patient ID: Teresa Michael, female    DOB: [birth year], 43 y.o.   MRN: [id number]  Chief Complaint  Patient presents with  . Rash    whole body itchy and it comes and goes     HPI Patient is in today for refill on blood pressure medication-taking daily-no recent labwork Hypothyroid-endo following Pt with itching on the hands and feet after watering her flowers in the yard. Pt states wakes her from sleep. NO lesions noted on the skin. Redness with scratching. Pt has not changed laundry detergent or soaps used on the skin.  Past Medical History:  Diagnosis Date  . Hyperlipidemia   . Hypertension   . Thyroid disease     Past Surgical History:  Procedure Laterality Date  . CESAREAN SECTION     2 previous    Family History  Problem Relation Age of Onset  . Depression Mother   . Hypertension Mother   . Hypertension Father   . Hyperlipidemia Father   . Depression Brother   . Hypertension Maternal Grandfather     Social History   Socioeconomic History  . Marital status: Married    Spouse name: Not on file  . Number of children: 2  . Years of education: 2012  . Highest education level: Not on file  Occupational History  . 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 Financial resource strain: Not on file  . Food insecurity:    Worry: Not on file    Inability: Not on file  . Transportation needs:    Medical: Not on file    Non-medical: Not on file  Tobacco Use  . Smoking status: Never Smoker  . Smokeless tobacco: Never Used  Substance and Sexual Activity  . Alcohol use: Yes    Comment: special occasions  . Drug use: No  . Sexual activity: Yes    Partners: Male    Birth control/protection: Condom  Lifestyle  . Physical activity:    Days per week: Not on file    Minutes per session: Not on file  . Stress: Not on file  Relationships  . Social connections:    Talks on phone: Not on file    Gets together: Not on file   Attends religious service: Not on file    Active member of club or organization: Not on file    Attends meetings of clubs or organizations: Not on file    Relationship status: Not on file  . Intimate partner violence:    Fear of current or ex partner: Not on file    Emotionally abused: Not on file    Physically abused: Not on file    Forced sexual activity: Not on file  Other Topics Concern  . Not on file  Social History Narrative   Pt. Exercises regularly    Outpatient Medications Prior to Visit  Medication Sig Dispense Refill  . fluticasone (FLONASE) 50 MCG/ACT nasal spray Place 2 sprays into both nostrils daily. 16 g 0  . levothyroxine (SYNTHROID) 137 MCG tablet TAKE 1 TABLET BY MOUTH ONCE DAILY FOR 90 DAYS    . losartan-hydrochlorothiazide (HYZAAR) 50-12.5 MG tablet Take 1 tablet by mouth daily. 90 tablet 1  . Multiple Vitamin (MULTIVITAMIN) tablet Take 1 tablet by mouth daily.    Marland Kitchen. levothyroxine (SYNTHROID, LEVOTHROID) 112 MCG tablet Take 137 mcg by mouth daily before breakfast.     . HYDROcodone-homatropine (HYCODAN) 5-1.5 MG/5ML syrup Take  5 mLs by mouth every 8 (eight) hours as needed for cough. 120 mL 0   No facility-administered medications prior to visit.     No Known Allergies  Review of Systems  Constitutional: Negative for fever.  Cardiovascular: Negative for chest pain and leg swelling.  Skin: Positive for itching. Negative for rash.       Objective:    Physical Exam  Constitutional: She appears well-developed and well-nourished.  HENT:  Head: Normocephalic and atraumatic.  Eyes: Conjunctivae are normal.  Neck: Normal range of motion. Neck supple.  Cardiovascular: Normal rate, regular rhythm and normal heart sounds.  Pulmonary/Chest: Effort normal and breath sounds normal.  Skin: No rash noted. No erythema.    BP 122/75 (BP Location: Right Arm, Patient Position: Sitting, Cuff Size: Normal)   Pulse 85   Temp 98.6 F (37 C) (Oral)   Ht 4\' 9"  (1.448 m)    Wt 128 lb 14.4 oz (58.5 kg)   SpO2 100%   BMI 27.89 kg/m  Wt Readings from Last 3 Encounters:  05/09/19 128 lb 14.4 oz (58.5 kg)  12/04/18 125 lb 6.4 oz (56.9 kg)  11/07/18 128 lb (58.1 kg)    Lab Results  Component Value Date   TSH 5.16 (H) 07/27/2016   Lab Results  Component Value Date   WBC 6.1 07/05/2018   HGB 10.7 (L) 07/05/2018   HCT 37.9 07/05/2018   MCV 69 (L) 07/05/2018   PLT 361 07/05/2018   Lab Results  Component Value Date   NA 142 07/05/2018   K 4.5 07/05/2018   CO2 21 07/05/2018   GLUCOSE 94 07/05/2018   BUN 13 07/05/2018   CREATININE 0.80 07/05/2018   BILITOT 0.3 07/05/2018   ALKPHOS 106 07/05/2018   AST 61 (H) 07/05/2018   ALT 71 (H) 07/05/2018   PROT 6.8 07/05/2018   ALBUMIN 4.4 07/05/2018   CALCIUM 9.2 07/05/2018   Lab Results  Component Value Date   CHOL 222 (H) 10/13/2018   Lab Results  Component Value Date   HDL 79 10/13/2018   Lab Results  Component Value Date   LDLCALC 123 (H) 10/13/2018   Lab Results  Component Value Date   TRIG 100 10/13/2018   Lab Results  Component Value Date   CHOLHDL 2.8 10/13/2018   Lab Results  Component Value Date   HGBA1C 5.4 10/13/2018       Assessment & Plan:   Problem List Items Addressed This Visit    None    1. Allergic urticaria Hydroxyzine-rx Add claritin  2. Essential hypertension Bmp, ua today-pt taking losartan/HCTZ daily-rx  Lorna Strother Hannah Beat, MD

## 2019-05-09 NOTE — Patient Instructions (Addendum)
     If you have lab work done today you will be contacted with your lab results within the next 2 weeks.  If you have not heard from [place] then please contact us. The fastest way to get your results is to register for My Chart. Take Claritin daily in the morning Take Hydroxyzine daily at night Avoid changing soap or laundry detergents  IF you received an x-ray today, you will receive an invoice from [facility]. Please contact [facility] at [phone] with questions or concerns regarding your invoice.   IF you received labwork today, you will receive an invoice from [place]. Please contact [facility] at [phone] with questions or concerns regarding your invoice.   Our billing staff will not be able to assist you with questions regarding bills from these companies.  You will be contacted with the lab results as soon as they are available. The fastest way to get your results is to activate your My Chart account. Instructions are located on the last page of this paperwork. If you have not heard from [place] regarding the results in 2 weeks, please contact this office.

## 2019-05-10 LAB — BASIC METABOLIC PANEL
BUN/Creatinine Ratio: 18 (ref 9–23)
BUN: 13 mg/dL (ref 6–24)
CO2: 20 mmol/L (ref 20–29)
Calcium: 8.9 mg/dL (ref 8.7–10.2)
Chloride: 105 mmol/L (ref 96–106)
Creatinine, Ser: 0.71 mg/dL (ref 0.57–1.00)
GFR calc Af Amer: 120 mL/min/{1.73_m2} (ref >59)
GFR calc non Af Amer: 104 mL/min/{1.73_m2} (ref >59)
Glucose: 88 mg/dL (ref 65–99)
Potassium: 4.1 mmol/L (ref 3.5–5.2)
Sodium: 139 mmol/L (ref 134–144)

## 2019-05-11 ENCOUNTER — Other Ambulatory Visit: Payer: Self-pay

## 2019-05-11 ENCOUNTER — Ambulatory Visit (INDEPENDENT_AMBULATORY_CARE_PROVIDER_SITE_OTHER): Payer: Self-pay | Admitting: Family Medicine

## 2019-05-11 DIAGNOSIS — R52 Pain, unspecified: Secondary | ICD-10-CM

## 2019-05-11 LAB — POCT URINALYSIS DIP (MANUAL ENTRY)
Bilirubin, UA: NEGATIVE
Glucose, UA: NEGATIVE mg/dL
Ketones, POC UA: NEGATIVE mg/dL
Leukocytes, UA: NEGATIVE
Nitrite, UA: NEGATIVE
Protein Ur, POC: NEGATIVE mg/dL
Spec Grav, UA: 1.025 (ref 1.010–1.025)
Urobilinogen, UA: 0.2 E.U./dL
pH, UA: 5.5 (ref 5.0–8.0)

## 2019-05-23 ENCOUNTER — Ambulatory Visit: Payer: No Typology Code available for payment source

## 2019-05-28 ENCOUNTER — Other Ambulatory Visit: Payer: Self-pay

## 2019-05-28 ENCOUNTER — Other Ambulatory Visit: Payer: Self-pay | Admitting: *Deleted

## 2019-05-28 ENCOUNTER — Inpatient Hospital Stay: Payer: Self-pay | Attending: Obstetrics and Gynecology | Admitting: *Deleted

## 2019-05-28 VITALS — BP 122/84 | Temp 98.4°F | Ht 60.25 in | Wt 131.0 lb

## 2019-05-28 DIAGNOSIS — Z0000 Encounter for general adult medical examination without abnormal findings: Secondary | ICD-10-CM

## 2019-05-28 DIAGNOSIS — Z1231 Encounter for screening mammogram for malignant neoplasm of breast: Secondary | ICD-10-CM

## 2019-05-28 NOTE — Progress Notes (Signed)
[facility] follow up   Clinical Measurement:  Height: 60.25 in Weight: 131 lb  Blood Pressure: 118/76  Blood Pressure #2: 122/84    Medical History: Patient states that she has been diagnosed with high blood pressure. Patient states that she has not been diagnosed with high cholesterol or diabetes.   Medications: Patient takes medication to lower blood pressure. Patient states that she does not take medication to lower cholesterol or blood sugar. Patient states that she does not take an aspirin a day to help prevent heart attack or stroke. During the past 7 days patient states that she has taken prescribed medication to lower blood pressure 5 days.  Blood pressure, self measurement: Patient states that she measures blood pressure daily from home. Patient regularly shares blood pressure readings with primary care doctor.   Nutrition: Patient states that she eats 1 cup of fruit per day and 1 of vegetables per day. Patient states that she eats fish at least two times a week. Patient states that more than half of her grain products that she consumes are whole grains. Patient drinks less than 36 ounces of beverages with added sugars per week and is currently watching her sodium and salt intake. In the past 7 seven days patient states that she has had a drink containing alcohol on 1 day. On average she consumes 2 drinks containing alcohol on a day that she drinks.   Physical activity:  Patient states that she gets 140 minutes of moderate physical activity in a week and 60 minutes of vigorous physical activity in a week.  Smoking status:  Patient states that she has never smoked tobacco.    Quality of life:  Patient states that over the past 2 weeks she has not had any days where she has felt little interest or pleasure in doing things, feeling down, depressed or hopeless.   Health Coaching: Spoke with patient about trying to increase daily fruit and vegetable intake. Went over recommendations for 2  servings of fruit and 3 servings of vegetables daily. Also spoke with patient about increasing the amount of water she consumes daily. Patient is currently drinking 3-5 glasses of water daily, educated patient that the goal would be 8 eight ounce glasses per day.  Navigation: This was the  follow up session for this patient, I will check up on her progress in the coming months.  Time: 20 minutes

## 2019-06-17 ENCOUNTER — Other Ambulatory Visit: Payer: Self-pay | Admitting: Family Medicine

## 2019-06-17 DIAGNOSIS — I10 Essential (primary) hypertension: Secondary | ICD-10-CM

## 2019-06-17 NOTE — Telephone Encounter (Signed)
Requested Prescriptions  Pending Prescriptions Disp Refills  . losartan-hydrochlorothiazide (HYZAAR) 50-12.5 MG tablet [Pharmacy Med Name: LOSARTAN/HCTZ 50/12.5MG  TABLETS] 90 tablet 0    Sig: TAKE 1 TABLET BY MOUTH DAILY     Cardiovascular: ARB + Diuretic Combos Passed - 06/17/2019  3:53 AM      Passed - K in normal range and within 180 days    Potassium  Date Value Ref Range Status  05/09/2019 4.1 3.5 - 5.2 mmol/L Final         Passed - Na in normal range and within 180 days    Sodium  Date Value Ref Range Status  05/09/2019 139 134 - 144 mmol/L Final         Passed - Cr in normal range and within 180 days    Creat  Date Value Ref Range Status  07/27/2016 0.80 0.50 - 1.10 mg/dL Final   Creatinine, Ser  Date Value Ref Range Status  05/09/2019 0.71 0.57 - 1.00 mg/dL Final         Passed - Ca in normal range and within 180 days    Calcium  Date Value Ref Range Status  05/09/2019 8.9 8.7 - 10.2 mg/dL Final         Passed - Patient is not pregnant      Passed - Last BP in normal range    BP Readings from Last 1 Encounters:  05/28/19 122/84         Passed - Valid encounter within last 6 months    Recent Outpatient Visits          1 month ago Generalized body aches   Primary Care at [facility], Arlie Solomons, MD   1 month ago Allergic urticaria   Primary Care at [facility], Rex Kras, MD   6 months ago Flu-like symptoms   Primary Care at [facility], Fenton Malling, MD   7 months ago Acute pain of left knee   [facility] at [facility], Lilia Argue, MD   10 months ago Essential hypertension   Primary Care at [facility] Arlie Solomons, MD

## 2019-06-20 ENCOUNTER — Telehealth: Payer: Self-pay | Admitting: Family Medicine

## 2019-06-20 NOTE — Telephone Encounter (Signed)
Pts husband tested positive and was taken in ambulance today . Pt I having chest pain and congestion. Pt has appt tomorrow at [facility] tomorrow . Is there  Faster ?

## 2019-06-22 ENCOUNTER — Telehealth (INDEPENDENT_AMBULATORY_CARE_PROVIDER_SITE_OTHER): Payer: Self-pay | Admitting: Registered Nurse

## 2019-06-22 ENCOUNTER — Other Ambulatory Visit: Payer: Self-pay

## 2019-06-22 ENCOUNTER — Emergency Department (HOSPITAL_COMMUNITY): Payer: Self-pay

## 2019-06-22 ENCOUNTER — Emergency Department (HOSPITAL_COMMUNITY)
Admission: EM | Admit: 2019-06-22 | Discharge: 2019-06-22 | Disposition: A | Discharge status: Home / Self Care | Payer: Self-pay | Attending: Emergency Medicine | Admitting: Emergency Medicine

## 2019-06-22 DIAGNOSIS — R5383 Other fatigue: Secondary | ICD-10-CM | POA: Insufficient documentation

## 2019-06-22 DIAGNOSIS — R509 Fever, unspecified: Secondary | ICD-10-CM | POA: Insufficient documentation

## 2019-06-22 DIAGNOSIS — I10 Essential (primary) hypertension: Secondary | ICD-10-CM | POA: Insufficient documentation

## 2019-06-22 DIAGNOSIS — R0602 Shortness of breath: Secondary | ICD-10-CM | POA: Insufficient documentation

## 2019-06-22 DIAGNOSIS — E860 Dehydration: Secondary | ICD-10-CM | POA: Insufficient documentation

## 2019-06-22 DIAGNOSIS — E039 Hypothyroidism, unspecified: Secondary | ICD-10-CM | POA: Insufficient documentation

## 2019-06-22 DIAGNOSIS — Z20822 Contact with and (suspected) exposure to covid-19: Secondary | ICD-10-CM

## 2019-06-22 DIAGNOSIS — R197 Diarrhea, unspecified: Secondary | ICD-10-CM

## 2019-06-22 DIAGNOSIS — R112 Nausea with vomiting, unspecified: Secondary | ICD-10-CM

## 2019-06-22 DIAGNOSIS — Z20828 Contact with and (suspected) exposure to other viral communicable diseases: Secondary | ICD-10-CM | POA: Insufficient documentation

## 2019-06-22 DIAGNOSIS — R6889 Other general symptoms and signs: Secondary | ICD-10-CM

## 2019-06-22 DIAGNOSIS — Z79899 Other long term (current) drug therapy: Secondary | ICD-10-CM | POA: Insufficient documentation

## 2019-06-22 DIAGNOSIS — M791 Myalgia, unspecified site: Secondary | ICD-10-CM | POA: Insufficient documentation

## 2019-06-22 LAB — BASIC METABOLIC PANEL
Anion gap: 13 (ref 5–15)
BUN: 6 mg/dL (ref 6–20)
CO2: 27 mmol/L (ref 22–32)
Calcium: 9.9 mg/dL (ref 8.9–10.3)
Chloride: 98 mmol/L (ref 98–111)
Creatinine, Ser: 0.84 mg/dL (ref 0.44–1.00)
GFR calc Af Amer: >60 mL/min (ref >60)
GFR calc non Af Amer: >60 mL/min (ref >60)
Glucose, Bld: 96 mg/dL (ref 70–99)
Potassium: 3.7 mmol/L (ref 3.5–5.1)
Sodium: 138 mmol/L (ref 135–145)

## 2019-06-22 LAB — CBC WITH DIFFERENTIAL/PLATELET
Abs Immature Granulocytes: 0.01 10*3/uL (ref 0.00–0.07)
Basophils Absolute: 0 10*3/uL (ref 0.0–0.1)
Basophils Relative: 0 %
Eosinophils Absolute: 0 10*3/uL (ref 0.0–0.5)
Eosinophils Relative: 0 %
HCT: 42.4 % (ref 36.0–46.0)
Hemoglobin: 12.6 g/dL (ref 12.0–15.0)
Immature Granulocytes: 0 %
Lymphocytes Relative: 17 %
Lymphs Abs: 0.6 10*3/uL — ABNORMAL LOW (ref 0.7–4.0)
MCH: 20.1 pg — ABNORMAL LOW (ref 26.0–34.0)
MCHC: 29.7 g/dL — ABNORMAL LOW (ref 30.0–36.0)
MCV: 67.7 fL — ABNORMAL LOW (ref 80.0–100.0)
Monocytes Absolute: 0.4 10*3/uL (ref 0.1–1.0)
Monocytes Relative: 11 %
Neutro Abs: 2.5 10*3/uL (ref 1.7–7.7)
Neutrophils Relative %: 72 %
Platelets: 280 10*3/uL (ref 150–400)
RBC: 6.26 MIL/uL — ABNORMAL HIGH (ref 3.87–5.11)
RDW: 17.5 % — ABNORMAL HIGH (ref 11.5–15.5)
WBC: 3.6 10*3/uL — ABNORMAL LOW (ref 4.0–10.5)
nRBC: 0 % (ref 0.0–0.2)

## 2019-06-22 LAB — MAGNESIUM: Magnesium: 1.8 mg/dL (ref 1.7–2.4)

## 2019-06-22 MED ORDER — SODIUM CHLORIDE 0.9 % IV BOLUS
1000.0000 mL | Freq: Once | INTRAVENOUS | Status: AC
  Administered 2019-06-22: 12:00:00 1000 mL via INTRAVENOUS

## 2019-06-22 MED ORDER — ONDANSETRON HCL 4 MG/2ML IJ SOLN
4.0000 mg | Freq: Once | INTRAMUSCULAR | Status: AC
  Administered 2019-06-22: 12:00:00 4 mg via INTRAVENOUS
  Filled 2019-06-22: qty 2

## 2019-06-22 MED ORDER — ONDANSETRON 4 MG PO TBDP: ORAL_TABLET | ORAL | 0 refills | Status: DC

## 2019-06-22 MED ORDER — ACETAMINOPHEN 500 MG PO TABS
1000.0000 mg | ORAL_TABLET | Freq: Once | ORAL | Status: AC
  Administered 2019-06-22: 12:00:00 1000 mg via ORAL
  Filled 2019-06-22: qty 2

## 2019-06-22 MED ORDER — ALBUTEROL SULFATE HFA 108 (90 BASE) MCG/ACT IN AERS
2.0000 | INHALATION_SPRAY | Freq: Once | RESPIRATORY_TRACT | Status: AC
  Administered 2019-06-22: 2 via RESPIRATORY_TRACT
  Filled 2019-06-22: qty 6.7

## 2019-06-22 NOTE — Telephone Encounter (Signed)
Patient has been seen already and she has spoke with the Dr

## 2019-06-22 NOTE — Progress Notes (Signed)
Spoke with pt and she informed me that she has not been feeling well x 2 weeks. She has been having COVID symptoms and would like to be prescribed something to help with her N/V and Diarrhea. She has also had some SHOB, cough and fever. Her husband is currently in the hospital with a positive COVID testing.  She has been tested but still waiting on results.

## 2019-06-22 NOTE — Progress Notes (Signed)
Telemedicine Encounter- SOAP NOTE Established Patient  This telephone encounter was conducted with the patient's (or proxy's) verbal consent via audio telecommunications: yes  Patient was instructed to have this encounter in a suitably private space; and to only have persons present to whom they give permission to participate. In addition, patient identity was confirmed by use of name plus two identifiers (DOB and address).  I discussed the limitations, risks, security and privacy concerns of performing an evaluation and management service by telephone and the availability of in person appointments. I also discussed with the patient that there may be a patient responsible charge related to this service. The patient expressed understanding and agreed to proceed.  I spent a total of 13 minutes talking with the patient or their proxy.  No chief complaint on file.   Subjective   Teresa Michael is a 43 y.o. established patient. Telephone visit today for COVID-19 symptoms  HPI Pt states that on Sunday, she started having symptoms of COVID-19 including NVD, fever, SHOB, cough. She has been tested for COVID and is awaiting results. Her husband unfortunately tested positive and is currently admitted to the hospital because of his symptoms. She states over the past week, her symptoms have changed. She is now not experiencing as much NVD, but but has had dizziness and extreme fatigue. She states her fevers are more intermittent but her headaches are still present. She also states that her lips have dried out and she feels like she can't hold onto fluids.  Patient Active Problem List   Diagnosis Date Noted  . 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 Essential hypertension 05/09/2019  . Unspecified hypothyroidism 10/04/2013    Past Medical History:  Diagnosis Date  . Hyperlipidemia   . Hypertension   . Thyroid disease     Current Outpatient Medications  Medication Sig Dispense Refill  .  fluticasone (FLONASE) 50 MCG/ACT nasal spray Place 2 sprays into both nostrils daily. 16 g 0  . hydrOXYzine (ATARAX/VISTARIL) 10 MG tablet Take one po qhs 30 tablet 3  . levothyroxine (SYNTHROID) 137 MCG tablet TAKE 1 TABLET BY MOUTH ONCE DAILY FOR 90 DAYS    . loratadine (CLARITIN) 10 MG tablet Take 10 mg by mouth daily.    Marland Kitchen losartan-hydrochlorothiazide (HYZAAR) 50-12.5 MG tablet TAKE 1 TABLET BY MOUTH DAILY 90 tablet 0  . Multiple Vitamin (MULTIVITAMIN) tablet Take 1 tablet by mouth daily.     No current facility-administered medications for this visit.     No Known Allergies  Social History   Socioeconomic History  . Marital status: Married    Spouse name: Not on file  . Number of children: 2  . Years of education: 70  . Highest education level: Not on file  Occupational History  . Occupation: Book Administrator, sports  . Financial resource strain: Not on file  . Food insecurity    Worry: Not on file    Inability: Not on file  . Transportation needs    Medical: No    Non-medical: No  Tobacco Use  . Smoking status: Never Smoker  . Smokeless tobacco: Never Used  Substance and Sexual Activity  . Alcohol use: Yes    Comment: special occasions  . Drug use: No  . Sexual activity: Yes    Partners: Male    Birth control/protection: Condom  Lifestyle  . Physical activity    Days per week: Not on file    Minutes per session: Not on file  .  Stress: Not on file  Relationships  . Social Musicianconnections    Talks on phone: Not on file    Gets together: Not on file    Attends religious service: Not on file    Active member of club or organization: Not on file    Attends meetings of clubs or organizations: Not on file    Relationship status: Not on file  . Intimate partner violence    Fear of current or ex partner: Not on file    Emotionally abused: Not on file    Physically abused: Not on file    Forced sexual activity: Not on file  Other Topics Concern  . Not on file   Social History Narrative   Pt. Exercises regularly    ROS Per hpi  Objective   Vitals as reported by the patient: There were no vitals filed for this visit.  Diagnoses and all orders for this visit:  Suspected Covid-19 Virus Infection   PLAN  Given patient's changing, and in a few cases, worsening symptoms, I have recommended that she proceed to the ED at this time.   We reviewed common courses of COVID -there is major concern for serious symptoms to increase in the second week of infection, which she is approaching. Her husband has a severe infection that warranted hospitalization, it would benefit this patient to have her O2 sat and fluid volume status assessed in person, as well as her other symptoms, which we cannot do in our office unfortunately.  Patient encouraged to call clinic with any questions, comments, or concerns.     I discussed the assessment and treatment plan with the patient. The patient was provided an opportunity to ask questions and all were answered. The patient agreed with the plan and demonstrated an understanding of the instructions.   The patient was advised to call back or seek an in-person evaluation if the symptoms worsen or if the condition fails to improve as anticipated.  I provided 14 minutes of non-face-to-face time during this encounter.  Janeece Ageeichard Ndidi Nesby, NP  Primary [facility] at [facility]

## 2019-06-22 NOTE — ED Triage Notes (Signed)
Pt endorses that her husband has covid and is at [facility]. She now feels winded and "warm" Tachy in triage. Afebrile.

## 2019-06-22 NOTE — ED Provider Notes (Addendum)
[facility] Provider Note   CSN: [id number] Arrival date & time: 06/22/19  1059    History   Chief Complaint Chief Complaint  Patient presents with  . Covid exposure    HPI Teresa Michael is a 43 y.o. female.     Teresa Michael is a 43 y.o. female with a history of hypertension, hyperlipidemia and thyroid disease, who presents to the ED for evaluation of muscle aches, fatigue, shortness of breath, nausea vomiting and diarrhea.  Symptoms have been present over the past week, after known close exposure with COVID positive patient.  Her husband developed symptoms previously and is now [facility] due to severe disease.  She reports that initially she had myalgias, fever, nausea vomiting and fatigue she is also had several episodes of nonbloody diarrhea.  Vomiting and diarrhea seem to be improving, but she now has some shortness of breath when she takes deep breaths, no associated chest pain. Occasional dry cough.  Fevers have improved as well. She also reports feeling lightheaded.  Continues to have significant fatigue, myalgias.  She also reports intermittent headaches.  No sore throat but does report some associated nasal congestion.  She has been taking Tylenol and Alka-Seltzer cold and flu at home and been drinking warm tea and lots of water.  She had outpatient COVID testing performed at [facility] yesterday but has not yet received results.  Had telemedicine visit with her PCP today due to worsening symptoms and was sent here for further evaluation.     Past Medical History:  Diagnosis Date  . Hyperlipidemia   . Hypertension   . Thyroid disease     Patient Active Problem List   Diagnosis Date Noted  . 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 Essential hypertension 05/09/2019  . Unspecified hypothyroidism 10/04/2013    Past Surgical History:  Procedure Laterality Date  . CESAREAN SECTION     2 previous     OB History     Gravida  2   Para  2   Term  2   Preterm      AB      Living  2     SAB      TAB      Ectopic      Multiple      Live Births  2            Home Medications    Prior to Admission medications   Medication Sig Start Date End Date Taking? Authorizing Provider  fluticasone (FLONASE) 50 MCG/ACT nasal spray Place 2 sprays into both nostrils daily. 12/04/18   Peyton NajjarHopper, David H, MD  hydrOXYzine (ATARAX/VISTARIL) 10 MG tablet Take one po qhs 05/09/19   Corum, Minerva FesterLisa L, MD  levothyroxine (SYNTHROID) 137 MCG tablet TAKE 1 TABLET BY MOUTH ONCE DAILY FOR 90 DAYS 04/28/19   [provider]  loratadine (CLARITIN) 10 MG tablet Take 10 mg by mouth daily.    [provider]  losartan-hydrochlorothiazide (HYZAAR) 50-12.5 MG tablet TAKE 1 TABLET BY MOUTH DAILY 06/17/19   Doristine BosworthStallings, Zoe A, MD  Multiple Vitamin (MULTIVITAMIN) tablet Take 1 tablet by mouth daily.    [provider]    Family History Family History  Problem Relation Age of Onset  . Depression Mother   . Hypertension Mother   . Hypertension Father   . Hyperlipidemia Father   . Depression Brother   . Hypertension Maternal Grandfather     Social History  Social History   Tobacco Use  . Smoking status: Never Smoker  . Smokeless tobacco: Never Used  Substance Use Topics  . Alcohol use: Yes    Comment: special occasions  . Drug use: No     Allergies   Patient has no known allergies.   Review of Systems Review of Systems  Constitutional: Positive for chills, fatigue and fever.  HENT: Positive for congestion and rhinorrhea. Negative for sore throat.   Eyes: Negative for visual disturbance.  Respiratory: Positive for cough and shortness of breath.   Cardiovascular: Negative for chest pain.  Gastrointestinal: Positive for diarrhea, nausea and vomiting. Negative for abdominal pain.  Genitourinary: Negative for dysuria and frequency.  Musculoskeletal: Positive for myalgias. Negative for  arthralgias, neck pain and neck stiffness.  Skin: Negative for color change and rash.  Neurological: Positive for light-headedness and headaches. Negative for dizziness, syncope, weakness and numbness.     Physical Exam Updated Vital Signs BP (!) 166/103 (BP Location: Right Arm)   Pulse (!) 115   Temp 98.1 F (36.7 C) (Oral)   Resp 20   LMP 06/11/2019 (Approximate)   SpO2 99%   Physical Exam Vitals signs and nursing note reviewed.  Constitutional:      General: She is not in acute distress.    Appearance: Normal appearance. She is well-developed and normal weight. She is not ill-appearing or diaphoretic.  HENT:     Head: Normocephalic and atraumatic.     Mouth/Throat:     Comments: Mucous membranes slightly dry Eyes:     General:        Right eye: No discharge.        Left eye: No discharge.  Neck:     Musculoskeletal: Neck supple.     Comments: No rigidity Cardiovascular:     Rate and Rhythm: Regular rhythm. Tachycardia present.     Heart sounds: Normal heart sounds.  Pulmonary:     Effort: Pulmonary effort is normal. No respiratory distress.     Breath sounds: Normal breath sounds.     Comments: Respirations equal and unlabored, patient able to speak in full sentences, lungs clear to auscultation bilaterally Abdominal:     General: Bowel sounds are normal. There is no distension.     Palpations: Abdomen is soft. There is no mass.     Tenderness: There is no abdominal tenderness. There is no guarding.     Comments: Abdomen soft, nondistended, nontender to palpation in all quadrants without guarding or peritoneal signs  Musculoskeletal:        General: No deformity.  Lymphadenopathy:     Cervical: No cervical adenopathy.  Skin:    General: Skin is warm and dry.     Capillary Refill: Capillary refill takes less than 2 seconds.  Neurological:     Mental Status: She is alert.     Comments: Speech is clear, able to follow commands Moves extremities without ataxia,  coordination intact  Psychiatric:        Mood and Affect: Mood normal.        Behavior: Behavior normal.      ED Treatments / Results  Labs (all labs ordered are listed, but only abnormal results are displayed) Labs Reviewed  CBC WITH DIFFERENTIAL/PLATELET - Abnormal; Notable for the following components:      Result Value   WBC 3.6 (*)    RBC 6.26 (*)    MCV 67.7 (*)    MCH 20.1 (*)    MCHC  29.7 (*)    RDW 17.5 (*)    Lymphs Abs 0.6 (*)    All other components within normal limits  BASIC METABOLIC PANEL  MAGNESIUM    EKG EKG Interpretation  Date/Time:  Friday June 22 2019 11:30:53 EDT Ventricular Rate:  100 PR Interval:    QRS Duration: 79 QT Interval:  343 QTC Calculation: 443 R Axis:   78 Text Interpretation:  Sinus tachycardia Borderline repolarization abnormality nonspecific st/t wave changes no prior to compare with Confirmed by Aletta Edouard [phone]) on 06/22/2019 11:43:40 AM   Radiology Dg Chest Port 1 View  Result Date: 06/22/2019 CLINICAL DATA:  Shortness of breath, cough and fever. EXAM: PORTABLE CHEST 1 VIEW COMPARISON:  January 03, 2015 FINDINGS: Cardiomediastinal silhouette is normal. Mediastinal contours appear intact. There is no evidence of focal airspace consolidation, pleural effusion or pneumothorax. Osseous structures are without acute abnormality. Soft tissues are grossly normal. IMPRESSION: No active disease. Electronically Signed   By: Fidela Salisbury M.D.   On: 06/22/2019 12:05    Procedures Procedures (including critical care time)  Medications Ordered in ED Medications  sodium chloride 0.9 % bolus 1,000 mL (1,000 mLs Intravenous New Bag/Given 06/22/19 1208)  ondansetron (ZOFRAN) injection 4 mg (4 mg Intravenous Given 06/22/19 1211)  acetaminophen (TYLENOL) tablet 1,000 mg (1,000 mg Oral Given 06/22/19 1202)  albuterol (VENTOLIN HFA) 108 (90 Base) MCG/ACT inhaler 2 puff (2 puffs Inhalation Given 06/22/19 1211)     Initial Impression /  Assessment and Plan / ED Course  I have reviewed the triage vital signs and the nursing notes.  Pertinent labs & imaging results that were available during my care of the patient were reviewed by me and considered in my medical decision making (see chart for details).  Patient with known COVID exposure presenting with 1 week of fevers, myalgias, nausea vomiting diarrhea, now with some shortness of breath and occasional cough.  Husband is currently hospitalized to [facility].  On arrival she is tachycardic but afebrile, mildly hypertensive but satting at 100% on room air with normal respiratory effort.  Had outpatient coronavirus test completed yesterday but has not yet received results, but patient is assumed positive given close contact.  Had telemedicine visit this morning but due to progressing symptoms she was sent here for further evaluation.  Given her episodes of vomiting and diarrhea I suspect that she is dehydrated causing mild tachycardia, EKG shows some nonspecific T wave ST changes which may be related to dehydration or electrolytes, she is not having any chest pain.  Will check basic labs and magnesium as well as chest x-ray.  I see no benefit in repeating COVID test, doubt patient will require admission.  Patient's chest x-ray is clear with no evidence of pneumonia or other active cardiopulmonary disease.  She does have leukopenia with white count of 3.6 which is expected with COVID, normal hemoglobin, no acute electrolyte derangements, and normal renal function.  Patient given IV fluids, Zofran, Tylenol and albuterol with improvement in her symptoms.  Tachycardia has resolved and patient has ambulated in the room and maintained O2 sats of 100%.  At this time patient is stable for discharge home, I have discussed with her that she should assume she has coronavirus at this time and continue to home quarantine.  I have discussed strict return precautions and encouraged the patient to purchase  a home pulse ox so that she can monitor her vitals.  Will send in prescription for Zofran to help with nausea.  I have encouraged patient to drink plenty of fluids.  Continue with Tylenol as needed for fevers and myalgias.  Patient expresses understanding and agreement with plan.  Discharged home in good condition.  Teresa Lawslvia Juarez Espitia was evaluated in Emergency Department on 06/22/2019 for the symptoms described in the history of present illness. She was evaluated in the context of the global COVID-19 pandemic, which necessitated consideration that the patient might be at risk for infection with the SARS-CoV-2 virus that causes COVID-19. Institutional protocols and algorithms that pertain to the evaluation of patients at risk for COVID-19 are in a state of rapid change based on information released by regulatory bodies including the CDC and federal and state organizations. These policies and algorithms were followed during the patient's care in the ED.   Final Clinical Impressions(s) / ED Diagnoses   Final diagnoses:  Suspected Covid-19 Virus Infection  Dehydration  Nausea vomiting and diarrhea    ED Discharge Orders         Ordered    ondansetron (ZOFRAN ODT) 4 MG disintegrating tablet     06/22/19 1401           Dartha LodgeFord, Heberto Sturdevant N, [place] 06/22/19 1403    Terrilee FilesButler, Michael C, MD 06/23/19 0850    Dartha LodgeFord, Eitan Doubleday N, PA-C 07/23/19 1454    Terrilee FilesButler, Michael C, MD 07/25/19 [phone]

## 2019-06-22 NOTE — Discharge Instructions (Signed)
You have COVID-19 virus, based on your symptoms and known exposure.  Please continue to quarantine at home and monitor your symptoms closely. You chest x-ray was clear. Antibiotics are not helpful in treating viral infection, the virus should run its course in about 10-14 days. Please make sure you are drinking plenty of fluids. You can treat your symptoms supportively with tylenol for fevers and pains, and over the counter cough syrups and throat lozenges to help with cough. Prescribed zofran for nausea. If your symptoms are not improving please follow up with you Primary doctor.   I recommend that you purchase a home pulse ox to help better monitor your oxygen at home, if you start to have increased work of breathing or shortness of breath or your oxygen drops below 90% please immediately return to the hospital for reevaluation.  If you develop persistent fevers, shortness of breath or difficulty breathing, chest pain, severe headache and neck pain, persistent nausea and vomiting or other new or concerning symptoms return to the Emergency department.

## 2019-06-22 NOTE — ED Notes (Signed)
Pt SpO2 stayed at 100% while ambulating inside of pt room.

## 2019-06-29 ENCOUNTER — Ambulatory Visit: Payer: Self-pay

## 2019-06-29 NOTE — Telephone Encounter (Signed)
appt scheduled for 07/02/2019 at 3 pm for virtual visit.  Pt agreeable. Dgaddy, CMA

## 2019-06-29 NOTE — Telephone Encounter (Signed)
Incoming call from patient with a complaint of cough with SOB.  Patient reports onset was 4 day ago.  Denies having a fever.  Reports when coughing that she would have to use her inhaler.  Patient would like to schedule an appointment.  With Dr.  Nolon Rod.     Reason for Disposition . Cough has been present for > 3 weeks  Answer Assessment - Initial Assessment Questions 1. ONSET: "When did the cough begin?"      68m days ago 2. SEVERITY: "How bad is the cough today?"      Same need to use inhaler when coughing 3. RESPIRATORY DISTRESS: "Describe your breathing."      When breathing deep have to cough 4. FEVER: "Do you have a fever?" If so, ask: "What is your temperature, how was it measured, and when did it start?"     denies 5. SPUTUM: "Describe the color of your sputum" (clear, white, yellow, green)   denies 6. HEMOPTYSIS: "Are you coughing up any blood?" If so ask: "How much?" (flecks, streaks, tablespoons, etc.)    denies 7. CARDIAC HISTORY: "Do you have any history of heart disease?" (e.g., heart attack, congestive heart failure)      denies 8. LUNG HISTORY: "Do you have any history of lung disease?"  (e.g., pulmonary embolus, asthma, emphysema)   denies 9. PE RISK FACTORS: "Do you have a history of blood clots?" (or: recent major surgery, recent prolonged travel, bedridden)     denies 10. OTHER SYMPTOMS: "Do you have any other symptoms?" (e.g., runny nose, wheezing, chest pain)       denies 11. PREGNANCY: "Is there any chance you are pregnant?" "When was your last menstrual period?"       18 of july 12. TRAVEL: "Have you traveled out of the country in the last month?" (e.g., travel history, exposures)       denies  Protocols used: [place]

## 2019-07-02 ENCOUNTER — Telehealth (INDEPENDENT_AMBULATORY_CARE_PROVIDER_SITE_OTHER): Payer: No Typology Code available for payment source | Admitting: Family Medicine

## 2019-07-02 ENCOUNTER — Other Ambulatory Visit: Payer: Self-pay

## 2019-07-02 DIAGNOSIS — U071 COVID-19: Secondary | ICD-10-CM

## 2019-07-02 DIAGNOSIS — R058 Other specified cough: Secondary | ICD-10-CM

## 2019-07-02 DIAGNOSIS — R05 Cough: Secondary | ICD-10-CM

## 2019-07-02 MED ORDER — HYDROCODONE-HOMATROPINE 5-1.5 MG/5ML PO SYRP: 5.0000 mL | ORAL_SOLUTION | Freq: Three times a day (TID) | ORAL | 0 refills | Status: DC | PRN

## 2019-07-02 MED ORDER — BENZONATATE 200 MG PO CAPS: 200.0000 mg | ORAL_CAPSULE | Freq: Two times a day (BID) | ORAL | 0 refills | Status: DC | PRN

## 2019-07-02 NOTE — Progress Notes (Signed)
Telemedicine Encounter- SOAP NOTE Established Patient  This telephone encounter was conducted with the patient's (or proxy's) verbal consent via audio telecommunications: yes/no: Yes Patient was instructed to have this encounter in a suitably private space; and to only have persons present to whom they give permission to participate. In addition, patient identity was confirmed by use of name plus two identifiers (DOB and address).  I discussed the limitations, risks, security and privacy concerns of performing an evaluation and management service by telephone and the availability of in person appointments. I also discussed with the patient that there may be a patient responsible charge related to this service. The patient expressed understanding and agreed to proceed.  I spent a total of TIME; 0 MIN TO 60 MIN: 15 minutes talking with the patient or their proxy.  CC: dry cough, + COVID Subjective   Teresa Michael is a 43 y.o. established patient. Telephone visit today for  HPI She was diagnosed with COVID a week ago She states that she got tested at the [facility]  Patient reports that she continues to have a dry cough without wheezing It causes some chest discomfort and comes in waves of attack She gets difficulty catching her breath She uses an albuterol inhaler that helps her She does not have asthma but was given the inhaler She coughs more in the day time  She states that she is able to sleep at night She states that she coughs herself awake at night  She also feels weak but is overall feeling better.   Depression screen [facility] 2/9 07/02/2019 06/22/2019 05/09/2019 11/07/2018 08/07/2018  Decreased Interest 0 0 0 0 0  Down, Depressed, Hopeless 0 0 0 0 0  PHQ - 2 Score 0 0 0 0 0  Altered sleeping - - - - -  Tired, decreased energy - - - - -  Change in appetite - - - - -  Feeling bad or failure about yourself  - - - - -  Trouble concentrating - - - - -  Moving slowly or  fidgety/restless - - - - -  Suicidal thoughts - - - - -  PHQ-9 Score - - - - -  Difficult doing work/chores - - - - -     Patient Active Problem List   Diagnosis Date Noted  . 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 Essential hypertension 05/09/2019  . Unspecified hypothyroidism 10/04/2013    Past Medical History:  Diagnosis Date  . Hyperlipidemia   . Hypertension   . Thyroid disease     Current Outpatient Medications  Medication Sig Dispense Refill  . benzonatate (TESSALON) 200 MG capsule Take 1 capsule (200 mg total) by mouth 2 (two) times daily as needed for cough. 60 capsule 0  . fluticasone (FLONASE) 50 MCG/ACT nasal spray Place 2 sprays into both nostrils daily. 16 g 0  . HYDROcodone-homatropine (HYCODAN) 5-1.5 MG/5ML syrup Take 5 mLs by mouth every 8 (eight) hours as needed for cough. 120 mL 0  . hydrOXYzine (ATARAX/VISTARIL) 10 MG tablet Take one po qhs 30 tablet 3  . levothyroxine (SYNTHROID) 137 MCG tablet TAKE 1 TABLET BY MOUTH ONCE DAILY FOR 90 DAYS    . loratadine (CLARITIN) 10 MG tablet Take 10 mg by mouth daily.    Marland Kitchen losartan-hydrochlorothiazide (HYZAAR) 50-12.5 MG tablet TAKE 1 TABLET BY MOUTH DAILY 90 tablet 0  . Multiple Vitamin (MULTIVITAMIN) tablet Take 1 tablet by mouth daily.    . ondansetron (ZOFRAN ODT) 4 MG  disintegrating tablet 4mg  ODT q4 hours prn nausea/vomit 10 tablet 0   No current facility-administered medications for this visit.     No Known Allergies  Social History   Socioeconomic History  . Marital status: Married    Spouse name: Not on file  . Number of children: 2  . Years of education: 3612  . Highest education level: Not on file  Occupational History  . 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 Financial resource strain: Not on file  . Food insecurity    Worry: Not on file    Inability: Not on file  . Transportation needs    Medical: No    Non-medical: No  Tobacco Use  . Smoking status: Never Smoker  . Smokeless tobacco: Never  Used  Substance and Sexual Activity  . Alcohol use: Yes    Comment: special occasions  . Drug use: No  . Sexual activity: Yes    Partners: Male    Birth control/protection: Condom  Lifestyle  . Physical activity    Days per week: Not on file    Minutes per session: Not on file  . Stress: Not on file  Relationships  . Social Musicianconnections    Talks on phone: Not on file    Gets together: Not on file    Attends religious service: Not on file    Active member of club or organization: Not on file    Attends meetings of clubs or organizations: Not on file    Relationship status: Not on file  . Intimate partner violence    Fear of current or ex partner: Not on file    Emotionally abused: Not on file    Physically abused: Not on file    Forced sexual activity: Not on file  Other Topics Concern  . Not on file  Social History Narrative   Pt. Exercises regularly    ROS Review of Systems  Constitutional: Negative for activity change, appetite change, chills and fever.  HENT: Negative for congestion, nosebleeds, trouble swallowing and voice change.   Respiratory: see hpi  Gastrointestinal: Negative for diarrhea, nausea and vomiting.  Genitourinary: Negative for difficulty urinating, dysuria, flank pain and hematuria.  Musculoskeletal: Negative for back pain, joint swelling and neck pain.  Neurological: Negative for dizziness, speech difficulty, light-headedness and numbness.  See HPI. All other review of systems negative.   Objective   Vitals as reported by the patient: There were no vitals filed for this visit. Normal pulmonary effort  Diagnoses and all orders for this visit:  COVID-19 virus infection Dry cough  - continued dry cough without other systemic signs of infection Advised cough syrup to help with dry cough and continue rest and hydration Also discuss how to minimize spread   Other orders -     benzonatate (TESSALON) 200 MG capsule; Take 1 capsule (200 mg  total) by mouth 2 (two) times daily as needed for cough. -     HYDROcodone-homatropine (HYCODAN) 5-1.5 MG/5ML syrup; Take 5 mLs by mouth every 8 (eight) hours as needed for cough.     I discussed the assessment and treatment plan with the patient. The patient was provided an opportunity to ask questions and all were answered. The patient agreed with the plan and demonstrated an understanding of the instructions.   The patient was advised to call back or seek an in-person evaluation if the symptoms worsen or if the condition fails to improve as anticipated.  I provided 15 minutes  of non-face-to-face time during this encounter.  Doristine BosworthZoe A Zebulon Gantt, MD  [facility] at [facility]

## 2019-07-02 NOTE — Progress Notes (Signed)
Pt has tested positive for covid over a week ago along with her husband and 3 children ages 70 and 70. She is asking for a medication that can help with the cough.  Says all other symptoms have subsided. The pharamacy she is requesting is the [facility]

## 2019-09-12 ENCOUNTER — Other Ambulatory Visit: Payer: Self-pay | Admitting: Family Medicine

## 2019-09-12 DIAGNOSIS — I10 Essential (primary) hypertension: Secondary | ICD-10-CM

## 2019-10-16 ENCOUNTER — Other Ambulatory Visit: Payer: Self-pay

## 2019-10-16 ENCOUNTER — Ambulatory Visit
Admission: RE | Admit: 2019-10-16 | Discharge: 2019-10-16 | Disposition: A | Discharge status: Home / Self Care | Payer: No Typology Code available for payment source | Source: Ambulatory Visit | Attending: Obstetrics and Gynecology | Admitting: Obstetrics and Gynecology

## 2019-10-16 ENCOUNTER — Ambulatory Visit (HOSPITAL_COMMUNITY)
Admission: RE | Admit: 2019-10-16 | Discharge: 2019-10-16 | Disposition: A | Discharge status: Home / Self Care | Payer: Self-pay | Source: Ambulatory Visit | Attending: Obstetrics and Gynecology | Admitting: Obstetrics and Gynecology

## 2019-10-16 ENCOUNTER — Encounter (HOSPITAL_COMMUNITY): Payer: Self-pay

## 2019-10-16 DIAGNOSIS — Z1231 Encounter for screening mammogram for malignant neoplasm of breast: Secondary | ICD-10-CM

## 2019-10-16 DIAGNOSIS — Z1239 Encounter for other screening for malignant neoplasm of breast: Secondary | ICD-10-CM | POA: Insufficient documentation

## 2019-10-16 NOTE — Patient Instructions (Signed)
Explained breast self awareness with Teresa Michael. Patient did not need a Pap smear today due to last Pap smear and HPV typing was 07/27/2016. Let her know [facility]CP will cover Pap smears and HPV typing every 5 years unless has a history of abnormal Pap smears. Referred patient to the [facility] for a screening mammogram. Appointment scheduled for Tuesday, October 16, 2019 at 1610. Patient aware of appointment and will be there. Let patient know the [facility] will follow up with her within the next couple weeks with results of her mammogram by letter or phone. Teresa Michael verbalized understanding.  Teresa Michael, Arvil Chaco, RN 8:28 PM

## 2019-10-16 NOTE — Progress Notes (Signed)
No complaints today.   Pap Smear: Pap smear not completed today. Last Pap smear was 07/27/2016 and normal with negative HPV. Per patient has no history of an abnormal Pap smear. Last Pap smear result is in Epic.  Physical exam: Breasts Breasts symmetrical. No skin abnormalities bilateral breasts. No nipple retraction bilateral breasts. No nipple discharge bilateral breasts. No lymphadenopathy. No lumps palpated bilateral breasts. No complaints of pain or tenderness on exam. Referred patient to the [facility] for a screening mammogram. Appointment scheduled for Tuesday, October 16, 2019 at 1610.        Pelvic/Bimanual No Pap smear completed today since last Pap smear and HPV typing was 07/27/2016. Pap smear not indicated per B[facility]CP guidelines.   Smoking History: Patient has never smoked.  Patient Navigation: Patient education provided. Access to services provided for patient through [facility]CP program.   Breast and Cervical Cancer Risk Assessment: Patient has no family history of breast cancer, known genetic mutations, or radiation treatment to the chest before age 78. Patient has no history of cervical dysplasia, immunocompromised, or DES exposure in-utero.  Risk Assessment    Risk Scores      10/16/2019 10/05/2018   Last edited by: Loletta Parish, RN Armond Hang, LPN   5-year risk: 0.5 % 0.5 %   Lifetime risk: 6.9 % 7 %

## 2019-12-18 ENCOUNTER — Other Ambulatory Visit: Payer: Self-pay | Admitting: Family Medicine

## 2019-12-18 DIAGNOSIS — I10 Essential (primary) hypertension: Secondary | ICD-10-CM

## 2019-12-18 NOTE — Telephone Encounter (Signed)
Requested Prescriptions  Pending Prescriptions Disp Refills  . losartan-hydrochlorothiazide (HYZAAR) 50-12.5 MG tablet [Pharmacy Med Name: LOSARTAN/HCTZ 50/12.5MG  TABLETS] 90 tablet 0    Sig: TAKE 1 TABLET BY MOUTH EVERY DAY     Cardiovascular: ARB + Diuretic Combos Failed - 12/18/2019  3:55 AM      Failed - Last BP in normal range    BP Readings from Last 1 Encounters:  10/16/19 (!) 143/80         Passed - K in normal range and within 180 days    Potassium  Date Value Ref Range Status  06/22/2019 3.7 3.5 - 5.1 mmol/L Final         Passed - Na in normal range and within 180 days    Sodium  Date Value Ref Range Status  06/22/2019 138 135 - 145 mmol/L Final  05/09/2019 139 134 - 144 mmol/L Final         Passed - Cr in normal range and within 180 days    Creat  Date Value Ref Range Status  07/27/2016 0.80 0.50 - 1.10 mg/dL Final   Creatinine, Ser  Date Value Ref Range Status  06/22/2019 0.84 0.44 - 1.00 mg/dL Final         Passed - Ca in normal range and within 180 days    Calcium  Date Value Ref Range Status  06/22/2019 9.9 8.9 - 10.3 mg/dL Final         Passed - Patient is not pregnant      Passed - Valid encounter within last 6 months    Recent Outpatient Visits          5 months ago COVID-19 virus infection   Primary Care at [facility], Manus Rudd, MD   5 months ago Suspected Covid-19 Virus Infection   [facility] at [facility], Gerlene Burdock, NP   7 months ago Generalized body aches   Primary Care at [facility], Manus Rudd, MD   7 months ago Allergic urticaria   Primary Care at [facility], Minerva Fester, MD   1 year ago Flu-like symptoms   Primary Care at [facility], Sandria Bales, MD

## 2020-02-27 ENCOUNTER — Encounter: Payer: Self-pay | Admitting: Family Medicine

## 2020-02-27 ENCOUNTER — Telehealth (INDEPENDENT_AMBULATORY_CARE_PROVIDER_SITE_OTHER): Payer: Self-pay | Admitting: Family Medicine

## 2020-02-27 ENCOUNTER — Other Ambulatory Visit: Payer: Self-pay

## 2020-02-27 VITALS — BP 137/85

## 2020-02-27 DIAGNOSIS — I10 Essential (primary) hypertension: Secondary | ICD-10-CM

## 2020-02-27 DIAGNOSIS — E039 Hypothyroidism, unspecified: Secondary | ICD-10-CM

## 2020-02-27 DIAGNOSIS — R5383 Other fatigue: Secondary | ICD-10-CM

## 2020-02-27 NOTE — Patient Instructions (Signed)
° ° ° °  If you have lab work done today you will be contacted with your lab results within the next 2 weeks.  If you have not heard from us then please contact us. The fastest way to get your results is to register for My Chart. ° ° °IF you received an x-ray today, you will receive an invoice from [facility]. Please contact [facility] at [phone] with questions or concerns regarding your invoice.  ° °IF you received labwork today, you will receive an invoice from [facility]. Please contact [facility] at [phone] with questions or concerns regarding your invoice.  ° °Our billing staff will not be able to assist you with questions regarding bills from these companies. ° °You will be contacted with the lab results as soon as they are available. The fastest way to get your results is to activate your My Chart account. Instructions are located on the last page of this paperwork. If you have not heard from us regarding the results in 2 weeks, please contact this office. °  ° ° ° °

## 2020-02-27 NOTE — Progress Notes (Signed)
Virtual Visit via Video Note  I connected with Teresa Michael on 02/27/20 at  5:00 PM EDT by a video enabled telemedicine application and verified that I am speaking with the correct person using two identifiers.  Location: Patient: in her car Provider: office    I discussed the limitations of evaluation and management by telemedicine and the availability of in person appointments. The patient expressed understanding and agreed to proceed.  History of Present Illness: Chief Complaint  Patient presents with  . Hypertension  . Fatigue    to boost immune system  . Stress   Patient reports that she has been feeling very tired.  She reports that she is going through a difficult time with her husband's diagnosis of Leukemia. She states that she is wanted to ensure her immune system is okay.  She wants to feel better.   Depression screen [facility] 2/9 02/27/2020 07/02/2019 06/22/2019 05/09/2019 11/07/2018  Decreased Interest 0 0 0 0 0  Down, Depressed, Hopeless 0 0 0 0 0  PHQ - 2 Score 0 0 0 0 0  Altered sleeping - - - - -  Tired, decreased energy - - - - -  Change in appetite - - - - -  Feeling bad or failure about yourself  - - - - -  Trouble concentrating - - - - -  Moving slowly or fidgety/restless - - - - -  Suicidal thoughts - - - - -  PHQ-9 Score - - - - -  Difficult doing work/chores - - - - -      Observations/Objective: Vitals:   02/27/20 1658  BP: 137/85   Physical Exam  Constitutional: Oriented to person, place, and time. Appears well-developed and well-nourished.  HENT:  Head: Normocephalic and atraumatic.  Eyes: Conjunctivae and EOM are normal.  Pulmonary/Chest: Effort normal. No respiratory distress.  Neurological: Is alert and oriented to person, place, and time.  Psychiatric: Has a normal mood and affect. Behavior is normal. Judgment and thought content normal.    Assessment and Plan: Problem List Items Addressed This Visit      Cardiovascular and  Mediastinum   Essential hypertension - Primary - continue current meds, will assess renal function    Relevant Orders   CMP14+EGFR    Other Visit Diagnoses    Other fatigue    - could be from acute stress or deficiency or her thyroid out of range   Relevant Orders   CBC   TSH   VITAMIN D 25 Hydroxy (Vit-D Deficiency, Fractures)   T4, Free   Acquired hypothyroidism    -  Will check, her fatigue could be because her thyroid is out of range       Follow Up Instructions:    I discussed the assessment and treatment plan with the patient. The patient was provided an opportunity to ask questions and all were answered. The patient agreed with the plan and demonstrated an understanding of the instructions.   The patient was advised to call back or seek an in-person evaluation if the symptoms worsen or if the condition fails to improve as anticipated.  I provided 15 minutes of non-face-to-face time during this encounter.   Forrest Moron, MD

## 2020-03-05 ENCOUNTER — Other Ambulatory Visit: Payer: Self-pay

## 2020-03-05 ENCOUNTER — Ambulatory Visit (INDEPENDENT_AMBULATORY_CARE_PROVIDER_SITE_OTHER): Payer: Self-pay | Admitting: Family Medicine

## 2020-03-05 DIAGNOSIS — R5383 Other fatigue: Secondary | ICD-10-CM

## 2020-03-05 DIAGNOSIS — I10 Essential (primary) hypertension: Secondary | ICD-10-CM

## 2020-03-06 LAB — CBC
Hematocrit: 36.9 % (ref 34.0–46.6)
Hemoglobin: 10.9 g/dL — ABNORMAL LOW (ref 11.1–15.9)
MCH: 20.5 pg — ABNORMAL LOW (ref 26.6–33.0)
MCHC: 29.5 g/dL — ABNORMAL LOW (ref 31.5–35.7)
MCV: 70 fL — ABNORMAL LOW (ref 79–97)
Platelets: 318 10*3/uL (ref 150–450)
RBC: 5.31 x10E6/uL — ABNORMAL HIGH (ref 3.77–5.28)
RDW: 16.3 % — ABNORMAL HIGH (ref 11.7–15.4)
WBC: 4.7 10*3/uL (ref 3.4–10.8)

## 2020-03-06 LAB — CMP14+EGFR
ALT: 172 IU/L — ABNORMAL HIGH (ref 0–32)
AST: 73 IU/L — ABNORMAL HIGH (ref 0–40)
Albumin/Globulin Ratio: 1.7 (ref 1.2–2.2)
Albumin: 4.2 g/dL (ref 3.8–4.8)
Alkaline Phosphatase: 184 IU/L — ABNORMAL HIGH (ref 39–117)
BUN/Creatinine Ratio: 18 (ref 9–23)
BUN: 15 mg/dL (ref 6–24)
Bilirubin Total: <0.2 mg/dL (ref 0.0–1.2)
CO2: 22 mmol/L (ref 20–29)
Calcium: 9.3 mg/dL (ref 8.7–10.2)
Chloride: 105 mmol/L (ref 96–106)
Creatinine, Ser: 0.85 mg/dL (ref 0.57–1.00)
GFR calc Af Amer: 96 mL/min/{1.73_m2} (ref >59)
GFR calc non Af Amer: 84 mL/min/{1.73_m2} (ref >59)
Globulin, Total: 2.5 g/dL (ref 1.5–4.5)
Glucose: 108 mg/dL — ABNORMAL HIGH (ref 65–99)
Potassium: 4.5 mmol/L (ref 3.5–5.2)
Sodium: 139 mmol/L (ref 134–144)
Total Protein: 6.7 g/dL (ref 6.0–8.5)

## 2020-03-06 LAB — VITAMIN D 25 HYDROXY (VIT D DEFICIENCY, FRACTURES): Vit D, 25-Hydroxy: 11.7 ng/mL — ABNORMAL LOW (ref 30.0–100.0)

## 2020-03-06 LAB — T4, FREE: Free T4: 1.85 ng/dL — ABNORMAL HIGH (ref 0.82–1.77)

## 2020-03-06 LAB — TSH: TSH: 0.02 u[IU]/mL — ABNORMAL LOW (ref 0.450–4.500)

## 2020-03-09 ENCOUNTER — Telehealth: Payer: Self-pay | Admitting: Family Medicine

## 2020-03-09 ENCOUNTER — Other Ambulatory Visit: Payer: Self-pay | Admitting: Family Medicine

## 2020-03-09 DIAGNOSIS — D508 Other iron deficiency anemias: Secondary | ICD-10-CM

## 2020-03-09 DIAGNOSIS — E0590 Thyrotoxicosis, unspecified without thyrotoxic crisis or storm: Secondary | ICD-10-CM

## 2020-03-09 DIAGNOSIS — E559 Vitamin D deficiency, unspecified: Secondary | ICD-10-CM

## 2020-03-09 MED ORDER — DOCUSATE SODIUM 100 MG PO CAPS: 100.0000 mg | ORAL_CAPSULE | Freq: Every day | ORAL | 0 refills | Status: DC | PRN

## 2020-03-09 MED ORDER — FERROUS SULFATE 325 (65 FE) MG PO TABS: 325.0000 mg | ORAL_TABLET | Freq: Every day | ORAL | 3 refills | Status: DC

## 2020-03-09 MED ORDER — VITAMIN D (ERGOCALCIFEROL) 1.25 MG (50000 UNIT) PO CAPS: 50000.0000 [IU] | ORAL_CAPSULE | ORAL | 3 refills | Status: DC

## 2020-03-09 NOTE — Telephone Encounter (Signed)
Notified pt by phone of results and referral.

## 2020-03-22 ENCOUNTER — Other Ambulatory Visit: Payer: Self-pay | Admitting: Family Medicine

## 2020-03-22 DIAGNOSIS — I10 Essential (primary) hypertension: Secondary | ICD-10-CM

## 2020-05-23 ENCOUNTER — Other Ambulatory Visit: Payer: Self-pay

## 2020-05-23 ENCOUNTER — Encounter: Payer: Self-pay | Admitting: Family Medicine

## 2020-05-23 ENCOUNTER — Ambulatory Visit (INDEPENDENT_AMBULATORY_CARE_PROVIDER_SITE_OTHER): Payer: Self-pay | Admitting: Family Medicine

## 2020-05-23 VITALS — BP 144/88 | HR 70 | Temp 98.3°F | Ht 60.25 in | Wt 136.0 lb

## 2020-05-23 DIAGNOSIS — H01004 Unspecified blepharitis left upper eyelid: Secondary | ICD-10-CM

## 2020-05-23 MED ORDER — OFLOXACIN 0.3 % OP SOLN: 1.0000 [drp] | Freq: Four times a day (QID) | OPHTHALMIC | 0 refills | Status: DC

## 2020-05-23 MED ORDER — DOXYCYCLINE HYCLATE 100 MG PO TABS: 100.0000 mg | ORAL_TABLET | Freq: Two times a day (BID) | ORAL | 0 refills | Status: DC

## 2020-05-23 NOTE — Patient Instructions (Addendum)
    Use the ofloxacin eyedrops 1 drop 3 or 4 times daily in left eye.  Take doxycycline 100 mg 1 twice daily for 1 week.  Caution that it can make you sunburn easier.  Take with but not with dairy products which can mess up absorption of the medicine.  Return if worse or see an eye doctor if needed.  If you have lab work done today you will be contacted with your lab results within the next 2 weeks.  If you have not heard from [place] then please contact us. The fastest way to get your results is to register for My Chart.   IF you received an x-ray today, you will receive an invoice from [facility]. Please contact [facility] at [phone] with questions or concerns regarding your invoice.   IF you received labwork today, you will receive an invoice from [facility]. Please contact [facility] at [phone] with questions or concerns regarding your invoice.   Our billing staff will not be able to assist you with questions regarding bills from these companies.  You will be contacted with the lab results as soon as they are available. The fastest way to get your results is to activate your My Chart account. Instructions are located on the last page of this paperwork. If you have not heard from [place] regarding the results in 2 weeks, please contact this office.

## 2020-05-23 NOTE — Progress Notes (Signed)
Patient ID: Teresa Michael, female    DOB: 06/13/1976  Age: 44 y.o. MRN: [id number]  Chief Complaint  Patient presents with  . L eye swelling x 5 days    eye discharge in am, irritated, and preassure when leaning forward     Subjective:   Patient is here with a history of 5 or 6 days of swelling and inflammation of her left upper eyelid.  It is gradually gotten a little worse with puffiness below the eye.  It is a little painful and tender to touch.  She tried some OTC eyedrops that did not help.  No one else in the family has any similar infections.  Her husband does have leukemia and is on chemotherapy.  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  Current allergies, medications, problem list, past/family and social histories reviewed.  Objective:  BP (!) 144/88   Pulse 70   Temp 98.3 F (36.8 C)   Ht 5' 0.25" (1.53 m)   Wt 136 lb (61.7 kg)   LMP 05/02/2020   SpO2 100%   BMI 26.34 kg/m   Left upper eyelid is visibly swollen and erythematous.  There is visible puffiness below the left eye, though it is not red.  The conjunctiva is quite, clear, no pus.  Pupils react readily.  There is no stye pointing up, but the lateral half to two thirds of the upper lid is erythematous and swollen and tender to touch.  Right side is normal.  Assessment & Plan:   Assessment: 1. Blepharitis of left upper eyelid, unspecified type       Plan: We will treat both with drops and oral antibiotics.  She is to get back to [place] if worse.  No orders of the defined types were placed in this encounter.   Meds ordered this encounter  Medications  . doxycycline (VIBRA-TABS) 100 MG tablet    Sig: Take 1 tablet (100 mg total) by mouth 2 (two) times daily.    Dispense:  14 tablet    Refill:  0  . ofloxacin (OCUFLOX) 0.3 % ophthalmic solution    Sig: Place 1 drop into the left eye 4 (four) times daily.    Dispense:  5 mL    Refill:  0          Patient Instructions      Use the ofloxacin eyedrops 1 drop 3 or 4 times daily in left eye.  Take doxycycline 100 mg 1 twice daily for 1 week.  Caution that it can make you sunburn easier.  Take with but not with dairy products which can mess up absorption of the medicine.  Return if worse or see an eye doctor if needed.  If you have lab work done today you will be contacted with your lab results within the next 2 weeks.  If you have not heard from [place] then please contact us. The fastest way to get your results is to register for My Chart.   IF you received an x-ray today, you will receive an invoice from [facility]. Please contact [facility] at [phone] with questions or concerns regarding your invoice.   IF you received labwork today, you will receive an invoice from [facility]. Please contact [facility] at [phone] with questions or concerns regarding your invoice.   Our billing staff will not be able to assist you with questions regarding bills from these companies.  You will be contacted  with the lab results as soon as they are available. The fastest way to get your results is to activate your My Chart account. Instructions are located on the last page of this paperwork. If you have not heard from [place] regarding the results in 2 weeks, please contact this office.         Return if symptoms worsen or fail to improve.   Ruben Reason, MD 05/23/2020

## 2020-09-16 ENCOUNTER — Ambulatory Visit (INDEPENDENT_AMBULATORY_CARE_PROVIDER_SITE_OTHER): Payer: Self-pay | Admitting: Emergency Medicine

## 2020-09-16 ENCOUNTER — Encounter: Payer: Self-pay | Admitting: Emergency Medicine

## 2020-09-16 ENCOUNTER — Other Ambulatory Visit: Payer: Self-pay

## 2020-09-16 VITALS — BP 128/91 | HR 78 | Temp 98.3°F | Resp 16 | Ht 59.0 in | Wt 136.0 lb

## 2020-09-16 DIAGNOSIS — I10 Essential (primary) hypertension: Secondary | ICD-10-CM

## 2020-09-16 DIAGNOSIS — Z7689 Persons encountering health services in other specified circumstances: Secondary | ICD-10-CM

## 2020-09-16 DIAGNOSIS — F418 Other specified anxiety disorders: Secondary | ICD-10-CM

## 2020-09-16 DIAGNOSIS — E039 Hypothyroidism, unspecified: Secondary | ICD-10-CM

## 2020-09-16 MED ORDER — ALPRAZOLAM 0.25 MG PO TABS: 0.2500 mg | ORAL_TABLET | Freq: Two times a day (BID) | ORAL | 1 refills | Status: DC | PRN

## 2020-09-16 NOTE — Patient Instructions (Addendum)
   If you have lab work done today you will be contacted with your lab results within the next 2 weeks.  If you have not heard from us then please contact us. The fastest way to get your results is to register for My Chart.   IF you received an x-ray today, you will receive an invoice from [facility]. Please contact [facility] at [phone] with questions or concerns regarding your invoice.   IF you received labwork today, you will receive an invoice from [facility]. Please contact [facility] at [phone] with questions or concerns regarding your invoice.   Our billing staff will not be able to assist you with questions regarding bills from these companies.  You will be contacted with the lab results as soon as they are available. The fastest way to get your results is to activate your My Chart account. Instructions are located on the last page of this paperwork. If you have not heard from us regarding the results in 2 weeks, please contact this office.     Mantenimiento de la salud en las mujeres Health Maintenance, Female Adoptar un estilo de vida saludable y recibir atencin preventiva son importantes para promover la salud y el bienestar. Consulte al mdico sobre:  El esquema adecuado para hacerse pruebas y exmenes peridicos.  Cosas que puede hacer por su cuenta para prevenir enfermedades y mantenerse sana. Qu debo saber sobre la dieta, el peso y el ejercicio? Consuma una dieta saludable   Consuma una dieta que incluya muchas verduras, frutas, productos lcteos con bajo contenido de grasa y protenas magras.  No consuma muchos alimentos ricos en grasas slidas, azcares agregados o sodio. Mantenga un peso saludable El ndice de masa muscular (IMC) se utiliza para identificar problemas de peso. Proporciona una estimacin de la grasa corporal basndose en el peso y la altura. Su mdico puede ayudarle a determinar su IMC y a lograr o mantener un peso  saludable. Haga ejercicio con regularidad Haga ejercicio con regularidad. Esta es una de las prcticas ms importantes que puede hacer por su salud. La mayora de los adultos deben seguir estas pautas:  Realizar, al menos, 150minutos de actividad fsica por semana. El ejercicio debe aumentar la frecuencia cardaca y hacerlo transpirar (ejercicio de intensidad moderada).  Hacer ejercicios de fortalecimiento por lo menos dos veces por semana. Agregue esto a su plan de ejercicio de intensidad moderada.  Pasar menos tiempo sentados. Incluso la actividad fsica ligera puede ser beneficiosa. Controle sus niveles de colesterol y lpidos en la sangre Comience a realizarse anlisis de lpidos y colesterol en la sangre a los 20aos y luego reptalos cada 5aos. Hgase controlar los niveles de colesterol con mayor frecuencia si:  Sus niveles de lpidos y colesterol son altos.  Es mayor de 40aos.  Presenta un alto riesgo de padecer enfermedades cardacas. Qu debo saber sobre las pruebas de deteccin del cncer? Segn su historia clnica y sus antecedentes familiares, es posible que deba realizarse pruebas de deteccin del cncer en diferentes edades. Esto puede incluir pruebas de deteccin de lo siguiente:  Cncer de mama.  Cncer de cuello uterino.  Cncer colorrectal.  Cncer de piel.  Cncer de pulmn. Qu debo saber sobre la enfermedad cardaca, la diabetes y la hipertensin arterial? Presin arterial y enfermedad cardaca  La hipertensin arterial causa enfermedades cardacas y aumenta el riesgo de accidente cerebrovascular. Es ms probable que esto se manifieste en las personas que tienen lecturas de presin arterial alta, tienen ascendencia africana o   tienen sobrepeso.  Hgase controlar la presin arterial: ? Cada 3 a 5 aos si tiene entre 18 y 39 aos. ? Todos los aos si es mayor de 40aos. Diabetes Realcese exmenes de deteccin de la diabetes con regularidad. Este  anlisis revisa el nivel de azcar en la sangre en ayunas. Hgase las pruebas de deteccin:  Cada tresaos despus de los 40aos de edad si tiene un peso normal y un bajo riesgo de padecer diabetes.  Con ms frecuencia y a partir de una edad inferior si tiene sobrepeso o un alto riesgo de padecer diabetes. Qu debo saber sobre la prevencin de infecciones? Hepatitis B Si tiene un riesgo ms alto de contraer hepatitis B, debe someterse a un examen de deteccin de este virus. Hable con el mdico para averiguar si tiene riesgo de contraer la infeccin por hepatitis B. Hepatitis C Se recomienda el anlisis a:  Todos los que nacieron entre 1945 y 1965.  Todas las personas que tengan un riesgo de haber contrado hepatitis C. Enfermedades de transmisin sexual (ETS)  Hgase las pruebas de deteccin de ITS, incluidas la gonorrea y la clamidia, si: ? Es sexualmente activa y es menor de 24aos. ? Es mayor de 24aos, y el mdico le informa que corre riesgo de tener este tipo de infecciones. ? La actividad sexual ha cambiado desde que le hicieron la ltima prueba de deteccin y tiene un riesgo mayor de tener clamidia o gonorrea. Pregntele al mdico si usted tiene riesgo.  Pregntele al mdico si usted tiene un alto riesgo de contraer VIH. El mdico tambin puede recomendarle un medicamento recetado para ayudar a evitar la infeccin por el VIH. Si elige tomar medicamentos para prevenir el VIH, primero debe hacerse los anlisis de deteccin del VIH. Luego debe hacerse anlisis cada 3meses mientras est tomando los medicamentos. Embarazo  Si est por dejar de menstruar (fase premenopusica) y usted puede quedar embarazada, busque asesoramiento antes de quedar embarazada.  Tome de 400 a 800microgramos (mcg) de cido flico todos los das si queda embarazada.  Pida mtodos de control de la natalidad (anticonceptivos) si desea evitar un embarazo no deseado. Osteoporosis y menopausia La  osteoporosis es una enfermedad en la que los huesos pierden los minerales y la fuerza por el avance de la edad. El resultado pueden ser fracturas en los huesos. Si tiene 65aos o ms, o si est en riesgo de sufrir osteoporosis y fracturas, pregunte a su mdico si debe:  Hacerse pruebas de deteccin de prdida sea.  Tomar un suplemento de calcio o de vitamina D para reducir el riesgo de fracturas.  Recibir terapia de reemplazo hormonal (TRH) para tratar los sntomas de la menopausia. Siga estas instrucciones en su casa: Estilo de vida  No consuma ningn producto que contenga nicotina o tabaco, como cigarrillos, cigarrillos electrnicos y tabaco de mascar. Si necesita ayuda para dejar de fumar, consulte al mdico.  No consuma drogas.  No comparta agujas.  Solicite ayuda a su mdico si necesita apoyo o informacin para abandonar las drogas. Consumo de alcohol  No beba alcohol si: ? Su mdico le indica no hacerlo. ? Est embarazada, puede estar embarazada o est tratando de quedar embarazada.  Si bebe alcohol: ? Limite la cantidad que consume de 0 a 1 medida por da. ? Limite la ingesta si est amamantando.  Est atento a la cantidad de alcohol que hay en las bebidas que toma. En los [place], una medida equivale a una botella de cerveza de 12oz (355ml),   un vaso de vino de 5oz ( ) o un vaso de una bebida alcohlica de alta graduacin de 1oz (46ml). Instrucciones generales  Realcese los estudios de rutina de la salud, dentales y de Wellsite geologist.  Mantngase al da con las vacunas.  Infrmele a su mdico si: ? Se siente deprimida con frecuencia. ? Alguna vez ha sido vctima de [place] o no se siente segura en su casa. Resumen  Adoptar un estilo de vida saludable y recibir atencin preventiva son importantes para promover la salud y Counsellor.  Siga las instrucciones del mdico acerca de una dieta saludable, el ejercicio y la realizacin de pruebas o exmenes  para Hotel manager.  Siga las instrucciones del mdico con respecto al control del colesterol y la presin arterial. Esta informacin no tiene Theme park manager el consejo del mdico. Asegrese de hacerle al mdico cualquier pregunta que tenga. Document Revised: 12/06/2018 Document Reviewed: 12/06/2018 Elsevier Patient Education  2020 [employer].  Control de la ansiedad en los adultos Managing Anxiety, Adult Despus de haber sido diagnosticado con trastorno de ansiedad, podra sentirse aliviado por comprender por qu se haba sentido o haba actuado de cierto modo. Es posible que tambin se sienta abrumado por el tratamiento que tiene por delante y por lo que este significar para su vida. Con atencin y [place], [place] afeccin y recuperarse. Cmo manejar los cambios en el estilo de vida Control del estrs y la ansiedad  El estrs es la reaccin del cuerpo ante los cambios y los acontecimientos de la vida, tanto buenos Vesta. La [employer]son de los episodios de estrs duran slo algunas horas, pero el estrs puede ser continuo y Science writer a ms que solo estrs. Aunque el estrs puede desempear un papel importante en la ansiedad, no es lo mismo que la ansiedad. El estrs generalmente es causado por algo externo, como una fecha lmite, una prueba o una competencia. El estrs normalmente pasa despus de que el evento desencadenante ha terminado.  La ansiedad es causada por algo interno, por ejemplo, imaginar un resultado terrible o preocuparse porque algo ir mal y lo devastar. A menudo, la ansiedad no desaparece incluso despus de que el evento desencadenante ha finalizado y puede tornarse en una preocupacin a largo plazo (crnica). Es importante comprender las diferencias entre el estrs y la ansiedad, y Chief Operating Officer el estrs de manera efectiva para que no genere una respuesta de ansiedad. Hable con el mdico o un consejero para obtener ms informacin sobre cmo reducir la  ansiedad y Development worker, community. Es posible que el profesional sugiera tcnicas para reducir la tensin, tales como:  Musicoterapia. Esto podra incluir crear o escuchar msica que disfrute y lo inspire.  Meditacin consciente. Esto implica prestar atencin a la respiracin normal sin intentar controlarla. Puede realizarse mientras est sentado o camina.  Oracin centrante. Esto implica centrarse en una palabra, frase o imagen sagrada que le signifique algo y le genere paz.  Respiracin profunda. Para hacer esto, expanda el estmago e inhale lentamente por la nariz. Mantenga el aire durante unos 3a5segundos. Luego, exhale lentamente mientras deja que los msculos del estmago se relajen.  Dilogo interno. Esto implica identificar patrones de pensamiento que provocan reacciones de ansiedad y cambiar esos patrones.  Relajacin muscular. Esto implica tensar los msculos y, [place], [place]. Elija una tcnica para reducir la tensin que se adapte a su estilo de vida y su personalidad. Estas tcnicas llevan tiempo y prctica. Resrvese de 5a34minutos por da para Associate Professor. Algunos terapeutas pueden  ofrecer orientacin y capacitacin en estas tcnicas. Es posible que algunos planes de seguros mdicos cubran la capacitacin. Otras cosas que puede hacer para controlar el estrs y la ansiedad incluyen:  Llevar un diario de estrs/ansiedad. Esto puede ayudarlo a identificar qu le desencadena su reaccin y, Engineer, mining, aprender las maneras de controlar su respuesta al [place].  Pensar en cmo reacciona ante ciertas situaciones. Es posible que no sea capaz de Chief Operating Officer todo, pero puede Corporate investment banker.  Hacerse tiempo para las actividades que lo ayudan a Lexicographer y no sentir culpa por pasar su tiempo de [place].  La formacin de imgenes visuales y el yoga pueden ayudarlo a Pharmacologist la calma y [place].  Medicamentos Los medicamentos pueden ayudar a [employer]. Algunos medicamentos para la  ansiedad:  Programme researcher, broadcasting/film/video ansiedad.  Antidepresivos. A menudo, los medicamentos se usan como tratamiento primario para el trastorno de [place]. Un mdico recetar los medicamentos. Cuando se usan juntos, los medicamentos, la psicoterapia y las tcnicas de reduccin de la tensin pueden ser el tratamiento ms efectivo. Las [employer] relaciones interpersonales pueden ser muy importantes para ayudar a su recuperacin. Intente pasar ms tiempo interactuando con amigos y familiares de [place]. Considere la posibilidad de ir a terapia de pareja, tomar clases de educacin familiar o ir a Information systems manager. La terapia puede ayudarlos a usted y a los dems a comprender mejor su afeccin. Cmo Facilities manager en su ansiedad Cada persona responde de [place] diferente al tratamiento de la ansiedad. Se dice que est recuperado de la ansiedad cuando los sntomas disminuyen y dejan de Producer, television/film/video en las actividades diarias en el hogar o [place]. Esto podra significar que usted comenzar a Radio producer lo siguiente:  Dealer y atencin. Tener menos interferencia de la preocupacin en el pensamiento diario.  Dormir mejor.  Estar menos irritable.  Tener ms energa.  Tener [employer]. Es Public librarian cundo el trastorno [place]. Comunquese con el mdico si sus sntomas interfieren en su hogar o su trabajo, y usted siente que su afeccin no est mejorando. Siga estas instrucciones en su casa: Actividad  Realizar actividad fsica. La [employer]son de los adultos debe hacer lo siguiente: ? Education officer, environmental, al [place], de actividad fsica por semana. El ejercicio debe aumentar la frecuencia cardaca y Media planner transpirar (ejercicio de intensidad moderada). ? Realizar ejercicios de fortalecimiento por lo [employer] por semana.  Dormir bien y por el tiempo adecuado. La [employer]son de los adultos necesitan entre 7y9horas de sueo todas las noches. Estilo de  vida   Siga una dieta saludable que incluya abundantes frutas, verduras, cereales integrales, productos lcteos descremados y protenas magras. No consuma muchos alimentos ricos en grasas slidas, azcares agregados o sal.  Opte por cosas que le simplifiquen la vida.  No consuma ningn producto que contenga nicotina o tabaco, como cigarrillos, cigarrillos electrnicos y tabaco de Theatre manager. Si necesita ayuda para dejar de fumar, consulte al mdico.  Evite el consumo de cafena, alcohol y ciertos medicamentos contra el resfro de venta sin receta. Estos podran Optician, dispensing. Pregntele al farmacutico qu medicamentos no debera tomar. Instrucciones generales  [employer] de venta libre y los recetados solamente como se lo haya indicado el mdico.  Oceanographer a todas las visitas de seguimiento como se lo haya indicado el mdico. Esto es importante. Dnde buscar apoyo Puede conseguir ayuda y [employer] siguientes lugares:  Grupos de [place].  Organizaciones comunitarias y en lnea.  Un lder espiritual de  confianza.  Terapia de pareja.  Clases de educacin familiar.  Terapia familiar. Dnde buscar ms informacin Formar parte de un grupo de apoyo podra resultarle til para enfrentar la ansiedad. Las siguientes fuentes pueden ayudarlo a Medical laboratory scientific officer consejeros o grupos de apoyo cerca de su hogar:  Mental Health America (Salud Mental de los [place]): www.mentalhealthamerica.net  Anxiety and Depression Association of [place] [ADAA] (Asociacin de Ansiedad y Depresin de los Estados Unidos): ProgramCam.de  The First American on Mental Illness [NAMI] (Alianza Nacional Sobre Enfermedades Mentales): www.nami.org Comunquese con un mdico si:  Le resulta difcil permanecer concentrado o finalizar las tareas diarias.  Pasa muchas horas por da sintindose preocupado por la vida cotidiana.  La preocupacin le provoca un cansancio extremo.  Comienza a tener dolores  de [place] o nuseas, o a sentirse tenso.  Orina ms de lo normal.  Tiene diarrea. Solicite ayuda inmediatamente si tiene:  Latidos cardacos acelerados y falta de aire.  Pensamientos acerca de Radiographer, therapeutic a Economist. Si alguna vez siente que puede lastimarse o Physicist, medical a Economist, o tiene pensamientos de poner fin a su vida, busque ayuda de inmediato. Puede dirigirse al servicio de emergencias ms cercano o comunicarse con:  El servicio de emergencias de su localidad (911 en [place]  Una lnea de asistencia al suicida y Visual merchandiser en crisis, como National Suicide Prevention Lifeline (Lnea Nacional de Prevencin del Suicidio), al [phone]. Est disponible las 24 horas del da. Resumen  Tomar medidas para aprender y usar tcnicas de reduccin de la tensin puede ayudarlo a calmarse y a Chiropractor reaccin de ansiedad.  Cuando se usan juntos, los medicamentos, la psicoterapia y las tcnicas de reduccin de la tensin pueden ser el tratamiento ms efectivo.  Los familiares, los amigos y las parejas pueden tener un lugar importante en su recuperacin del trastorno de ansiedad. Esta informacin no tiene Theme park manager el consejo del mdico. Asegrese de hacerle al mdico cualquier pregunta que tenga. Document Revised: 05/23/2019 Document Reviewed: 05/23/2019 Elsevier Patient Education  2020 [employer].

## 2020-09-16 NOTE — Progress Notes (Signed)
Teresa Michael 44 y.o.   Chief Complaint  Patient presents with  . Transitions Of Care    former Dr Creta Levin  . Hypertension    on 09/07/2020 patient was seen at ED, patient's mother passed in April 2021 from Covid     HISTORY OF PRESENT ILLNESS: This is a 44 y.o. female here to establish care with me, used to see Dr. Creta Levin. Has history of hypertension presently on Hyzaar 50-12.5 mg daily. Also has history of hypothyroidism on Synthroid 137 mcg daily. Presently under a tremendous amount of stress due to recent passing of her mother due to Covid complications and husband's recent diagnosis of leukemia presently getting treatment and getting better. No other complaints or medical concerns today.  HPI   Prior to Admission medications   Medication Sig Start Date End Date Taking? Authorizing Provider  ferrous sulfate (FERROUSUL) 325 (65 FE) MG tablet Take 1 tablet (325 mg total) by mouth daily with breakfast. 03/09/20  Yes Stallings, Zoe A, MD  fluticasone (FLONASE) 50 MCG/ACT nasal spray Place 2 sprays into both nostrils daily. 12/04/18  Yes Peyton Najjar, MD  levothyroxine (SYNTHROID) 137 MCG tablet TAKE 1 TABLET BY MOUTH ONCE DAILY FOR 90 DAYS 04/28/19  Yes [provider]  loratadine (CLARITIN) 10 MG tablet Take 10 mg by mouth daily.    Yes [provider]  losartan-hydrochlorothiazide (HYZAAR) 50-12.5 MG tablet TAKE 1 TABLET BY MOUTH EVERY DAY 03/22/20  Yes Collie Siad A, MD  Multiple Vitamin (MULTIVITAMIN) tablet Take 1 tablet by mouth daily.   Yes [provider]  Vitamin D, Ergocalciferol, (DRISDOL) 1.25 MG (50000 UNIT) CAPS capsule Take 1 capsule (50,000 Units total) by mouth every 7 (seven) days. 03/09/20  Yes Doristine Bosworth, MD  docusate sodium (COLACE) 100 MG capsule Take 1 capsule (100 mg total) by mouth daily as needed for mild constipation (take with iron supplement). Patient not taking: Reported on 09/16/2020 03/09/20   Doristine Bosworth, MD  hydrOXYzine (ATARAX/VISTARIL) 10 MG tablet Take one po qhs Patient not taking: Reported on 09/16/2020 05/09/19   Wandra Feinstein, MD  ofloxacin (OCUFLOX) 0.3 % ophthalmic solution Place 1 drop into the left eye 4 (four) times daily. Patient not taking: Reported on 09/16/2020 05/23/20   Peyton Najjar, MD    No Known Allergies  Patient Active Problem List   Diagnosis Date Noted  . Essential hypertension 05/09/2019  . Unspecified hypothyroidism 10/04/2013    Past Medical History:  Diagnosis Date  . Hyperlipidemia   . Hypertension   . Thyroid disease     Past Surgical History:  Procedure Laterality Date  . CESAREAN SECTION     2 previous    Social History   Socioeconomic History  . Marital status: Married    Spouse name: Not on file  . Number of children: 2  . Years of education: 50  . Highest education level: Not on file  Occupational History  . Occupation: Conservator, museum/gallery  Tobacco Use  . Smoking status: Never Smoker  . Smokeless tobacco: Never Used  Vaping Use  . Vaping Use: Never used  Substance and Sexual Activity  . Alcohol use: Yes    Comment: special occasions  . Drug use: No  . Sexual activity: Yes    Partners: Male    Birth control/protection: Condom  Other Topics Concern  . Not on file  Social History Narrative   Pt. Exercises regularly   Social Determinants of Health  Financial Resource Strain:   . Difficulty of Paying Living Expenses: Not on file  Food Insecurity:   . Worried About Programme researcher, broadcasting/film/video in the Last Year: Not on file  . Ran Out of Food in the Last Year: Not on file  Transportation Needs:   . Lack of Transportation (Medical): Not on file  . Lack of Transportation (Non-Medical): Not on file  Physical Activity:   . Days of Exercise per Week: Not on file  . Minutes of Exercise per Session: Not on file  Stress:   . Feeling of Stress : Not on file  Social Connections:   . Frequency of Communication with Friends and Family: Not  on file  . Frequency of Social Gatherings with Friends and Family: Not on file  . Attends Religious Services: Not on file  . Active Member of Clubs or Organizations: Not on file  . Attends Banker Meetings: Not on file  . Marital Status: Not on file  Intimate Partner Violence:   . Fear of Current or Ex-Partner: Not on file  . Emotionally Abused: Not on file  . Physically Abused: Not on file  . Sexually Abused: Not on file    Family History  Problem Relation Age of Onset  . Depression Mother   . Hypertension Mother   . Hypertension Father   . Hyperlipidemia Father   . Depression Brother   . Hypertension Maternal Grandfather      Review of Systems  Constitutional: Negative.  Negative for chills and fever.  HENT: Negative.  Negative for congestion and sore throat.   Respiratory: Negative.  Negative for cough and shortness of breath.   Cardiovascular: Negative.  Negative for palpitations.  Gastrointestinal: Negative.  Negative for abdominal pain, diarrhea, nausea and vomiting.  Genitourinary: Negative.  Negative for dysuria and hematuria.  Musculoskeletal: Negative.  Negative for back pain, myalgias and neck pain.  Skin: Negative.  Negative for rash.  Neurological: Negative.  Negative for dizziness and headaches.  Psychiatric/Behavioral: The patient is nervous/anxious.   All other systems reviewed and are negative.   Today's Vitals   09/16/20 1020  BP: (!) 128/91  Pulse: 78  Resp: 16  Temp: 98.3 F (36.8 C)  TempSrc: Temporal  SpO2: 98%  Weight: 136 lb (61.7 kg)  Height: 4\' 11"  (1.499 m)   Body mass index is 27.47 kg/m. Wt Readings from Last 3 Encounters:  09/16/20 136 lb (61.7 kg)  05/23/20 136 lb (61.7 kg)  10/16/19 136 lb (61.7 kg)    Physical Exam Vitals reviewed.  Constitutional:      Appearance: Normal appearance.  HENT:     Head: Normocephalic.  Eyes:     Extraocular Movements: Extraocular movements intact.     Conjunctiva/sclera:  Conjunctivae normal.     Pupils: Pupils are equal, round, and reactive to light.  Cardiovascular:     Rate and Rhythm: Normal rate and regular rhythm.     Pulses: Normal pulses.     Heart sounds: Normal heart sounds.  Pulmonary:     Effort: Pulmonary effort is normal.     Breath sounds: Normal breath sounds.  Musculoskeletal:        General: Normal range of motion.     Cervical back: Neck supple. No tenderness.  Skin:    General: Skin is warm and dry.  Neurological:     General: No focal deficit present.     Mental Status: She is alert and oriented to person, place, and  time.  Psychiatric:        Mood and Affect: Mood normal.        Behavior: Behavior normal.      ASSESSMENT & PLAN: Jamas Lavlvia was seen today for transitions of care and hypertension.  Diagnoses and all orders for this visit:  Encounter to establish care  Essential hypertension  Acquired hypothyroidism  Situational anxiety -     ALPRAZolam (XANAX) 0.25 MG tablet; Take 1 tablet (0.25 mg total) by mouth 2 (two) times daily as needed for anxiety.    Patient Instructions       If you have lab work done today you will be contacted with your lab results within the next 2 weeks.  If you have not heard from us then please contact us. The fastest way to get your results is to register for My Chart.   IF you received an x-ray today, you will receive an invoice from [facility]. Please contact [facility] at [phone] with questions or concerns regarding your invoice.   IF you received labwork today, you will receive an invoice from [facility]. Please contact [facility] at [phone] with questions or concerns regarding your invoice.   Our billing staff will not be able to assist you with questions regarding bills from these companies.  You will be contacted with the lab results as soon as they are available. The fastest way to get your results is to activate your My Chart account. Instructions  are located on the last page of this paperwork. If you have not heard from us regarding the results in 2 weeks, please contact this office.     Mantenimiento de Radiographer, therapeuticla salud en las mujeres Health Maintenance, Female Adoptar un estilo de vida saludable y recibir atencin preventiva son importantes para promover la salud y Counsellorel bienestar. Consulte al mdico sobre:  El esquema adecuado para hacerse pruebas y exmenes peridicos.  Cosas que puede hacer por su cuenta para prevenir enfermedades y Thrivent Financialmantenerse sana. Qu debo saber sobre la dieta, el peso y el ejercicio? Consuma una dieta saludable   Consuma una dieta que incluya muchas verduras, frutas, productos lcteos con bajo contenido de [place]grasa y Associate Professorprotenas magras.  No consuma muchos alimentos ricos en grasas slidas, azcares agregados o sodio. Mantenga un peso saludable El ndice de masa muscular [facility]) se [place]) Islandsutiliza para identificar problemas de Subiacopeso. Proporciona una estimacin de la grasa corporal basndose en el peso y la altura. Su mdico puede ayudarle a Engineer, sitedeterminar su IMC y a Personnel officerlograr o Pharmacologistmantener un peso saludable. Haga ejercicio con regularidad Haga ejercicio con regularidad. Esta es una de las prcticas ms importantes que puede hacer por su salud. La mayora de los adultos deben seguir estas pautas:  Education officer, environmentalealizar, al menos, 150minutos de actividad fsica por semana. El ejercicio debe aumentar la frecuencia cardaca y Media plannerhacerlo transpirar (ejercicio de intensidad moderada).  Hacer ejercicios de fortalecimiento por lo Rite Aidmenos dos veces por semana. Agregue esto a su plan de ejercicio de intensidad moderada.  Pasar menos tiempo sentados. Incluso la actividad fsica ligera puede ser beneficiosa. Controle sus niveles de colesterol y lpidos en la sangre Comience a realizarse anlisis de lpidos y Oncologistcolesterol en la sangre a los 20aos y luego reptalos cada 5aos. Hgase controlar los niveles de colesterol con mayor frecuencia si:  Sus niveles de lpidos  y colesterol son altos.  Es mayor de 40aos.  Presenta un alto riesgo de padecer enfermedades cardacas. Qu debo saber sobre las pruebas de deteccin del cncer? Segn su historia  clnica y sus antecedentes familiares, es posible que deba realizarse pruebas de deteccin del cncer en diferentes edades. Esto puede incluir pruebas de deteccin de lo siguiente:  Cncer de mama.  Cncer de cuello uterino.  Cncer colorrectal.  Cncer de piel.  Cncer de pulmn. Qu debo saber sobre la enfermedad cardaca, la diabetes y la hipertensin arterial? Presin arterial y enfermedad cardaca  La hipertensin arterial causa enfermedades cardacas y [place] el riesgo de accidente cerebrovascular. Es ms probable que esto se manifieste en las personas que tienen lecturas de presin arterial alta, tienen ascendencia africana o tienen sobrepeso.  Hgase controlar la presin arterial: ? Cada 3 a 5 aos si tiene entre 18 y 44 aos. ? Todos los aos si es mayor de [place]. Diabetes Realcese exmenes de deteccin de la diabetes con regularidad. Este anlisis revisa el nivel de azcar en la sangre en [place]. Hgase las pruebas de deteccin:  Cada tresaos despus de los 40aos de edad si tiene un peso normal y un bajo riesgo de padecer diabetes.  Con ms frecuencia y a partir de [place] edad inferior si tiene sobrepeso o un alto riesgo de padecer diabetes. Qu debo saber sobre la prevencin de infecciones? Hepatitis B Si tiene un riesgo ms alto de contraer hepatitis B, debe someterse a un examen de deteccin de este virus. Hable con el mdico para averiguar si tiene riesgo de contraer la infeccin por hepatitis B. Hepatitis C Se recomienda el anlisis a:  [employer] 1945 y 1965.  Todas las personas que tengan un riesgo de haber contrado hepatitis C. Enfermedades de transmisin sexual (ETS)  Hgase las pruebas de Airline pilot de ITS, incluidas la gonorrea y la clamidia,  si: ? Es sexualmente activa y es menor de [place]. ? Es mayor de 24aos, y Public affairs consultant informa que corre riesgo de tener este tipo de infecciones. ? La actividad sexual ha cambiado desde que le hicieron la ltima prueba de deteccin y tiene un riesgo mayor de Warehouse manager clamidia o Copy. Pregntele al mdico si usted tiene riesgo.  Pregntele al mdico si usted tiene un alto riesgo de Primary school teacher VIH. El mdico tambin puede recomendarle un medicamento recetado para ayudar a evitar la infeccin por el VIH. Si elige tomar medicamentos para prevenir el VIH, primero debe [employer] de deteccin del VIH. Luego debe hacerse anlisis cada mientras est tomando los medicamentos. Embarazo  Si est por dejar de Armed forces training and education officer (fase premenopusica) y usted puede quedar [place], busque asesoramiento antes de [place].  Tome de 400 a (mcg) de cido [employer] si [place].  Pida mtodos de control de la natalidad (anticonceptivos) si desea evitar un embarazo no deseado. Osteoporosis y [place] La osteoporosis es una enfermedad en la que los huesos pierden los minerales y la fuerza por el avance de la edad. El resultado pueden ser fracturas en los [place]. Si tiene 65aos o ms, o si est en riesgo de sufrir osteoporosis y fracturas, pregunte a su mdico si debe:  Hacerse pruebas de deteccin de prdida sea.  Tomar un suplemento de calcio o de vitamina D para reducir el riesgo de fracturas.  Recibir terapia de reemplazo hormonal (TRH) para tratar los sntomas de la menopausia. Siga estas instrucciones en su casa: Estilo de vida  No consuma ningn producto que contenga nicotina o tabaco, como cigarrillos, cigarrillos electrnicos y tabaco de Theatre manager. Si necesita ayuda para dejar de fumar, consulte al mdico.  No consuma drogas.  No comparta agujas.  Solicite ayuda a su mdico si necesita apoyo o informacin para abandonar las drogas. Consumo  de alcohol  No beba alcohol si: ? Su mdico le indica no hacerlo. ? Est embarazada, puede estar embarazada o est tratando de quedar embarazada.  Si bebe alcohol: ? Limite la cantidad que consume de 0 a 1 medida por da. ? Limite la ingesta si est amamantando.  Est atento a la cantidad de alcohol que hay en las bebidas que toma. En los [place], una medida equivale a una botella de cerveza de 12oz ( ), un vaso de vino de 5oz ( ) o un vaso de una bebida alcohlica de alta graduacin de 1oz (44ml). Instrucciones generales  Realcese los estudios de rutina de la salud, dentales y de Wellsite geologist.  Mantngase al da con las vacunas.  Infrmele a su mdico si: ? Se siente deprimida con frecuencia. ? Alguna vez ha sido vctima de [place] o no se siente segura en su casa. Resumen  Adoptar un estilo de vida saludable y recibir atencin preventiva son importantes para promover la salud y Counsellor.  Siga las instrucciones del mdico acerca de una dieta saludable, el ejercicio y la realizacin de pruebas o exmenes para Hotel manager.  Siga las instrucciones del mdico con respecto al control del colesterol y la presin arterial. Esta informacin no tiene Theme park manager el consejo del mdico. Asegrese de hacerle al mdico cualquier pregunta que tenga. Document Revised: 12/06/2018 Document Reviewed: 12/06/2018 Elsevier Patient Education  2020 [employer].  Control de la ansiedad en los adultos Managing Anxiety, Adult Despus de haber sido diagnosticado con trastorno de ansiedad, podra sentirse aliviado por comprender por qu se haba sentido o haba actuado de cierto modo. Es posible que tambin se sienta abrumado por el tratamiento que tiene por delante y por lo que este significar para su vida. Con atencin y [place], [place] afeccin y recuperarse. Cmo manejar los cambios en el estilo de vida Control del estrs y la ansiedad  El estrs  es la reaccin del cuerpo ante los cambios y los acontecimientos de la vida, tanto buenos [place]. La Harley-Davidson de los episodios de estrs duran slo algunas horas, pero el estrs puede ser continuo y Science writer a ms que solo estrs. Aunque el estrs puede desempear un papel importante en la ansiedad, no es lo mismo que la ansiedad. El estrs generalmente es causado por algo externo, como una fecha lmite, una prueba o una competencia. El estrs normalmente pasa despus de que el evento desencadenante ha terminado.  La ansiedad es causada por algo interno, por ejemplo, imaginar un resultado terrible o preocuparse porque algo ir mal y lo devastar. A menudo, la ansiedad no desaparece incluso despus de que el evento desencadenante ha finalizado y puede tornarse en una preocupacin a largo plazo (crnica). Es importante comprender las diferencias entre el estrs y la ansiedad, y Chief Operating Officer el estrs de manera efectiva para que no genere una respuesta de ansiedad. Hable con el mdico o un consejero para obtener ms informacin sobre cmo reducir la ansiedad y Development worker, community. Es posible que el profesional sugiera tcnicas para reducir la tensin, tales como:  Musicoterapia. Esto podra incluir crear o escuchar msica que disfrute y lo inspire.  Meditacin consciente. Esto implica prestar atencin a la respiracin normal sin intentar controlarla. Puede realizarse mientras est sentado o camina.  Oracin centrante. Esto implica centrarse en una palabra, frase o imagen sagrada que le signifique algo y  le genere paz.  Respiracin profunda. Para hacer esto, expanda el estmago e inhale lentamente por la nariz. Mantenga el aire durante unos 3a5segundos. Luego, exhale lentamente mientras deja que los msculos del estmago se relajen.  Dilogo interno. Esto implica identificar patrones de pensamiento que provocan reacciones de ansiedad y cambiar esos patrones.  Relajacin muscular. Esto implica tensar los msculos  y, [place], Clifford. Elija una tcnica para reducir la tensin que se adapte a su estilo de vida y su personalidad. Estas tcnicas llevan tiempo y prctica. Resrvese de 5a67minutos por da para Associate Professor. Algunos terapeutas pueden ofrecer orientacin y capacitacin en estas tcnicas. Es posible que algunos planes de seguros mdicos cubran la capacitacin. Otras cosas que puede hacer para controlar el estrs y la ansiedad incluyen:  Llevar un diario de estrs/ansiedad. Esto puede ayudarlo a identificar qu le desencadena su reaccin y, Engineer, mining, aprender las maneras de controlar su respuesta al [place].  Pensar en cmo reacciona ante ciertas situaciones. Es posible que no sea capaz de Chief Operating Officer todo, pero puede Corporate investment banker.  Hacerse tiempo para las actividades que lo ayudan a Lexicographer y no sentir culpa por pasar su tiempo de [place].  La formacin de imgenes visuales y el yoga pueden ayudarlo a Pharmacologist la calma y [place].  Medicamentos Los medicamentos pueden ayudar a [employer]. Algunos medicamentos para la ansiedad:  Programme researcher, broadcasting/film/video ansiedad.  Antidepresivos. A menudo, los medicamentos se usan como tratamiento primario para el trastorno de [place]. Un mdico recetar los medicamentos. Cuando se usan juntos, los medicamentos, la psicoterapia y las tcnicas de reduccin de la tensin pueden ser el tratamiento ms efectivo. Las [employer] relaciones interpersonales pueden ser muy importantes para ayudar a su recuperacin. Intente pasar ms tiempo interactuando con amigos y familiares de [place]. Considere la posibilidad de ir a terapia de pareja, tomar clases de educacin familiar o ir a Information systems manager. La terapia puede ayudarlos a usted y a los dems a comprender mejor su afeccin. Cmo Facilities manager en su ansiedad Cada persona responde de [place] diferente al tratamiento de la ansiedad. Se dice que est recuperado de la ansiedad cuando los  sntomas disminuyen y dejan de Producer, television/film/video en las actividades diarias en el hogar o [place]. Esto podra significar que usted comenzar a Radio producer lo siguiente:  Dealer y atencin. Tener menos interferencia de la preocupacin en el pensamiento diario.  Dormir mejor.  Estar menos irritable.  Tener ms energa.  Tener [employer]. Es Public librarian cundo el trastorno [place]. Comunquese con el mdico si sus sntomas interfieren en su hogar o su trabajo, y usted siente que su afeccin no est mejorando. Siga estas instrucciones en su casa: Actividad  Realizar actividad fsica. La Harley-Davidson de los adultos debe hacer lo siguiente: ? Education officer, environmental, al Disputanta, de actividad fsica por semana. El ejercicio debe aumentar la frecuencia cardaca y Media planner transpirar (ejercicio de intensidad moderada). ? Realizar ejercicios de fortalecimiento por lo [facility] por semana.  Dormir bien y por el tiempo adecuado. La Harley-Davidson de los adultos necesitan entre 7y9horas de sueo todas las noches. Estilo de vida   Siga una dieta saludable que incluya abundantes frutas, verduras, cereales integrales, productos lcteos descremados y protenas magras. No consuma muchos alimentos ricos en grasas slidas, azcares agregados o sal.  Opte por cosas que le simplifiquen la vida.  No consuma ningn producto que contenga nicotina o tabaco, como cigarrillos, cigarrillos electrnicos y tabaco de Theatre manager. Si necesita ayuda para dejar de  fumar, consulte al mdico.  Evite el consumo de cafena, alcohol y ciertos medicamentos contra el resfro de venta sin receta. Estos podran Optician, dispensing. Pregntele al farmacutico qu medicamentos no debera tomar. Instrucciones generales  [employer] de venta libre y los recetados solamente como se lo haya indicado el mdico.  Oceanographer a todas las visitas de seguimiento como se lo haya indicado el mdico. Esto es  importante. Dnde buscar apoyo Puede conseguir ayuda y [employer] siguientes lugares:  Grupos de [place].  Organizaciones comunitarias y en lnea.  Un lder espiritual de confianza.  Terapia de pareja.  Clases de educacin familiar.  Terapia familiar. Dnde buscar ms informacin Formar parte de un grupo de apoyo podra resultarle til para enfrentar la ansiedad. Las siguientes fuentes pueden ayudarlo a Medical laboratory scientific officer consejeros o grupos de apoyo cerca de su hogar:  Mental Health America (Salud Mental de los Estados Unidos): www.mentalhealthamerica.net  Anxiety and Depression Association of [place] [ADAA] (Asociacin de Ansiedad y Depresin de los Estados Unidos): ProgramCam.de  The First American on Mental Illness [NAMI] (Alianza Nacional Sobre Enfermedades Mentales): www.nami.org Comunquese con un mdico si:  Le resulta difcil permanecer concentrado o finalizar las tareas diarias.  Pasa muchas horas por da sintindose preocupado por la vida cotidiana.  La preocupacin le provoca un cansancio extremo.  Comienza a tener dolores de [place] o nuseas, o a sentirse tenso.  Orina ms de lo normal.  Tiene diarrea. Solicite ayuda inmediatamente si tiene:  Latidos cardacos acelerados y falta de aire.  Pensamientos acerca de Radiographer, therapeutic a Economist. Si alguna vez siente que puede lastimarse o Physicist, medical a Economist, o tiene pensamientos de poner fin a su vida, busque ayuda de inmediato. Puede dirigirse al servicio de emergencias ms cercano o comunicarse con:  El servicio de emergencias de su localidad (911 en [place].).  Una lnea de asistencia al suicida y Visual merchandiser en crisis, como National Suicide Prevention Lifeline (Lnea Nacional de Prevencin del Suicidio), al (667) 353-2470. Est disponible las 24 horas del da. Resumen  Tomar medidas para aprender y usar tcnicas de reduccin de la tensin puede ayudarlo a calmarse y a Chiropractor reaccin  de ansiedad.  Cuando se usan juntos, los medicamentos, la psicoterapia y las tcnicas de reduccin de la tensin pueden ser el tratamiento ms efectivo.  Los familiares, los amigos y las parejas pueden tener un lugar importante en su recuperacin del trastorno de ansiedad. Esta informacin no tiene Theme park manager el consejo del mdico. Asegrese de hacerle al mdico cualquier pregunta que tenga. Document Revised: 05/23/2019 Document Reviewed: 05/23/2019 Elsevier Patient Education  2020 Elsevier Inc.      Edwina Barth, MD [facility]

## 2020-10-21 ENCOUNTER — Other Ambulatory Visit: Payer: Self-pay | Admitting: *Deleted

## 2020-10-21 DIAGNOSIS — I10 Essential (primary) hypertension: Secondary | ICD-10-CM

## 2020-10-22 ENCOUNTER — Other Ambulatory Visit: Payer: Self-pay | Admitting: *Deleted

## 2020-10-22 DIAGNOSIS — I10 Essential (primary) hypertension: Secondary | ICD-10-CM

## 2020-10-22 MED ORDER — LOSARTAN POTASSIUM-HCTZ 50-12.5 MG PO TABS: 1.0000 | ORAL_TABLET | Freq: Every day | ORAL | 3 refills | Status: DC

## 2020-11-17 ENCOUNTER — Other Ambulatory Visit: Payer: Self-pay | Admitting: Obstetrics and Gynecology

## 2020-11-17 DIAGNOSIS — Z1231 Encounter for screening mammogram for malignant neoplasm of breast: Secondary | ICD-10-CM

## 2020-12-17 ENCOUNTER — Encounter: Payer: Self-pay | Admitting: Emergency Medicine

## 2020-12-17 ENCOUNTER — Ambulatory Visit (INDEPENDENT_AMBULATORY_CARE_PROVIDER_SITE_OTHER): Payer: Self-pay | Admitting: Emergency Medicine

## 2020-12-17 ENCOUNTER — Other Ambulatory Visit: Payer: Self-pay

## 2020-12-17 VITALS — BP 115/78 | HR 72 | Temp 97.2°F | Resp 16 | Ht 59.0 in | Wt 134.0 lb

## 2020-12-17 DIAGNOSIS — F418 Other specified anxiety disorders: Secondary | ICD-10-CM

## 2020-12-17 DIAGNOSIS — E559 Vitamin D deficiency, unspecified: Secondary | ICD-10-CM

## 2020-12-17 DIAGNOSIS — E039 Hypothyroidism, unspecified: Secondary | ICD-10-CM

## 2020-12-17 DIAGNOSIS — D649 Anemia, unspecified: Secondary | ICD-10-CM | POA: Insufficient documentation

## 2020-12-17 DIAGNOSIS — I10 Essential (primary) hypertension: Secondary | ICD-10-CM

## 2020-12-17 DIAGNOSIS — D508 Other iron deficiency anemias: Secondary | ICD-10-CM

## 2020-12-17 DIAGNOSIS — K8020 Calculus of gallbladder without cholecystitis without obstruction: Secondary | ICD-10-CM

## 2020-12-17 DIAGNOSIS — R748 Abnormal levels of other serum enzymes: Secondary | ICD-10-CM

## 2020-12-17 DIAGNOSIS — E785 Hyperlipidemia, unspecified: Secondary | ICD-10-CM

## 2020-12-17 MED ORDER — ATORVASTATIN CALCIUM 10 MG PO TABS: 10.0000 mg | ORAL_TABLET | Freq: Every day | ORAL | 3 refills | Status: DC

## 2020-12-17 NOTE — Assessment & Plan Note (Signed)
Much improved

## 2020-12-17 NOTE — Progress Notes (Signed)
Teresa Michael 45 y.o.   Chief Complaint  Patient presents with  . Anxiety    Follow up and depression  . Hypertension    Follow up    HISTORY OF PRESENT ILLNESS: This is a 45 y.o. female with several chronic medical problems here for follow-up. 1.  Hypertension: On Hyzaar 50--12.5 mg daily.  Doing well 2.  Hypothyroidism: Saw endocrinologist last November.  Dose of Synthroid was decreased to 125 mcg daily. 3.  Iron deficiency anemia 4.  Situational anxiety, much improved. 5.  Blood work done last April showed increased alkaline phosphatase and increased liver enzymes.  No follow-up.  We will repeat today.  Abdominal ultrasound done on March 2017 showed gallstones. Overall feels much better and has no complaints or medical concerns today. Mother died of COVID complications last year.  Husband was diagnosed with leukemia.  Fully vaccinated against COVID with a booster.  HPI   Prior to Admission medications   Medication Sig Start Date End Date Taking? Authorizing Provider  ALPRAZolam (XANAX) 0.25 MG tablet Take 1 tablet (0.25 mg total) by mouth 2 (two) times daily as needed for anxiety. 09/16/20  Yes Lorma Heater, Eilleen Kempf, MD  ferrous sulfate (FERROUSUL) 325 (65 FE) MG tablet Take 1 tablet (325 mg total) by mouth daily with breakfast. 03/09/20  Yes Stallings, Zoe A, MD  fluticasone (FLONASE) 50 MCG/ACT nasal spray Place 2 sprays into both nostrils daily. 12/04/18  Yes Peyton Najjar, MD  levothyroxine (SYNTHROID) 137 MCG tablet TAKE 1 TABLET BY MOUTH ONCE DAILY FOR 90 DAYS 04/28/19  Yes [provider]  loratadine (CLARITIN) 10 MG tablet Take 10 mg by mouth daily.    Yes [provider]  losartan-hydrochlorothiazide (HYZAAR) 50-12.5 MG tablet Take 1 tablet by mouth daily. 10/22/20  Yes Natonya Finstad, Eilleen Kempf, MD  Multiple Vitamin (MULTIVITAMIN) tablet Take 1 tablet by mouth daily.   Yes [provider]  Vitamin D, Ergocalciferol, (DRISDOL) 1.25 MG (50000  UNIT) CAPS capsule Take 1 capsule (50,000 Units total) by mouth every 7 (seven) days. 03/09/20  Yes Doristine Bosworth, MD  docusate sodium (COLACE) 100 MG capsule Take 1 capsule (100 mg total) by mouth daily as needed for mild constipation (take with iron supplement). Patient not taking: Reported on 12/17/2020 03/09/20   Doristine Bosworth, MD  hydrOXYzine (ATARAX/VISTARIL) 10 MG tablet Take one po qhs Patient not taking: Reported on 12/17/2020 05/09/19   Wandra Feinstein, MD  ofloxacin (OCUFLOX) 0.3 % ophthalmic solution Place 1 drop into the left eye 4 (four) times daily. Patient not taking: Reported on 12/17/2020 05/23/20   Peyton Najjar, MD    No Known Allergies  Patient Active Problem List   Diagnosis Date Noted  . Essential hypertension 05/09/2019  . Unspecified hypothyroidism 10/04/2013    Past Medical History:  Diagnosis Date  . Hyperlipidemia   . Hypertension   . Thyroid disease     Past Surgical History:  Procedure Laterality Date  . CESAREAN SECTION     2 previous    Social History   Socioeconomic History  . Marital status: Married    Spouse name: Not on file  . Number of children: 2  . Years of education: 34  . Highest education level: Not on file  Occupational History  . Occupation: Conservator, museum/gallery  Tobacco Use  . Smoking status: Never Smoker  . Smokeless tobacco: Never Used  Vaping Use  . Vaping Use: Never used  Substance and Sexual Activity  .  Alcohol use: Yes    Comment: special occasions  . Drug use: No  . Sexual activity: Yes    Partners: Male    Birth control/protection: Condom  Other Topics Concern  . Not on file  Social History Narrative   Pt. Exercises regularly   Social Determinants of Health   Financial Resource Strain: Not on file  Food Insecurity: Not on file  Transportation Needs: Not on file  Physical Activity: Not on file  Stress: Not on file  Social Connections: Not on file  Intimate Partner Violence: Not on file    Family History   Problem Relation Age of Onset  . Depression Mother   . Hypertension Mother   . Hypertension Father   . Hyperlipidemia Father   . Depression Brother   . Hypertension Maternal Grandfather      Review of Systems  Constitutional: Negative.  Negative for chills and fever.  HENT: Negative.  Negative for congestion and sore throat.   Respiratory: Negative.  Negative for cough and shortness of breath.   Cardiovascular: Negative.  Negative for chest pain and palpitations.  Gastrointestinal: Negative.  Negative for abdominal pain, blood in stool, diarrhea, melena, nausea and vomiting.  Genitourinary: Negative.  Negative for dysuria and hematuria.  Musculoskeletal: Negative.  Negative for back pain, myalgias and neck pain.  Skin: Negative.  Negative for rash.  Neurological: Negative.  Negative for dizziness and headaches.  All other systems reviewed and are negative.   Today's Vitals   12/17/20 0903  BP: 115/78  Pulse: 72  Resp: 16  Temp: (!) 97.2 F (36.2 C)  TempSrc: Temporal  SpO2: 98%  Weight: 134 lb (60.8 kg)  Height: 4\' 11"  (1.499 m)   Body mass index is 27.06 kg/m. Wt Readings from Last 3 Encounters:  12/17/20 134 lb (60.8 kg)  09/16/20 136 lb (61.7 kg)  05/23/20 136 lb (61.7 kg)    Physical Exam Vitals reviewed.  Constitutional:      Appearance: Normal appearance.  HENT:     Head: Normocephalic.  Eyes:     Extraocular Movements: Extraocular movements intact.     Pupils: Pupils are equal, round, and reactive to light.  Cardiovascular:     Rate and Rhythm: Normal rate and regular rhythm.     Pulses: Normal pulses.     Heart sounds: Normal heart sounds.  Pulmonary:     Effort: Pulmonary effort is normal.     Breath sounds: Normal breath sounds.  Abdominal:     General: Bowel sounds are normal. There is no distension.     Palpations: Abdomen is soft.     Tenderness: There is no abdominal tenderness.  Musculoskeletal:        General: Normal range of motion.      Cervical back: Normal range of motion and neck supple.  Skin:    General: Skin is warm and dry.     Capillary Refill: Capillary refill takes less than 2 seconds.  Neurological:     General: No focal deficit present.     Mental Status: She is alert and oriented to person, place, and time.  Psychiatric:        Mood and Affect: Mood normal.        Behavior: Behavior normal.      ASSESSMENT & PLAN: Clinically stable and much improved.  No medical concerns identified during this visit. Continue present medications.  No changes. Diet and nutrition discussed. Follow-up in 6 months. Essential hypertension Well-controlled hypertension.  Continue Hyzaar 50-12.5 mg daily. Follow-up in 6 months.  Gallstones Elevated alkaline phosphatase and liver enzymes in April 2021.  Ultrasound done in 2017 showed gallstones.  Asymptomatic.  Low-fat diet discussed with patient. Treatment options discussed with patient.  Acquired hypothyroidism Under the care of endocrinologist.  Synthroid dose reduced to 125 mcg daily recently.  Has follow-up appointment with endocrinologist soon.  Situational anxiety Much improved.  Dyslipidemia Not fasting today.  Previous lipid profile is abnormal.  We will start atorvastatin 10 mg daily.  Diet and nutrition discussed.  Jamas Lavlvia was seen today for anxiety and hypertension.  Diagnoses and all orders for this visit:  Essential hypertension  Elevated liver enzymes -     Comprehensive metabolic panel  Elevated alkaline phosphatase level -     Comprehensive metabolic panel  Acquired hypothyroidism  Other iron deficiency anemia -     CBC with Differential/Platelet  Vitamin D deficiency -     VITAMIN D 25 Hydroxy (Vit-D Deficiency, Fractures)  Gallstones  Situational anxiety Comments: Much improved  Dyslipidemia -     atorvastatin (LIPITOR) 10 MG tablet; Take 1 tablet (10 mg total) by mouth daily.    Patient Instructions       If you have  lab work done today you will be contacted with your lab results within the next 2 weeks.  If you have not heard from us then please contact us. The fastest way to get your results is to register for My Chart.   IF you received an x-ray today, you will receive an invoice from [facility]. Please contact [facility] at [phone] with questions or concerns regarding your invoice.   IF you received labwork today, you will receive an invoice from [facility]. Please contact [facility] at [phone] with questions or concerns regarding your invoice.   Our billing staff will not be able to assist you with questions regarding bills from these companies.  You will be contacted with the lab results as soon as they are available. The fastest way to get your results is to activate your My Chart account. Instructions are located on the last page of this paperwork. If you have not heard from us regarding the results in 2 weeks, please contact this office.     Mantenimiento de Radiographer, therapeuticla salud en las mujeres Health Maintenance, Female Adoptar un estilo de vida saludable y recibir atencin preventiva son importantes para promover la salud y Counsellorel bienestar. Consulte al mdico sobre:  El esquema adecuado para hacerse pruebas y exmenes peridicos.  Cosas que puede hacer por su cuenta para prevenir enfermedades y Thrivent Financialmantenerse sana. Qu debo saber sobre la dieta, el peso y el ejercicio? Consuma una dieta saludable  Consuma una dieta que incluya muchas verduras, frutas, productos lcteos con bajo contenido de [place]grasa y Associate Professorprotenas magras.  No consuma muchos alimentos ricos en grasas slidas, azcares agregados o sodio.   Mantenga un peso saludable El ndice de masa muscular [facility]) se [place] Islandsutiliza para identificar problemas de [place]. Proporciona una estimacin de la grasa corporal basndose en el peso y la altura. Su mdico puede ayudarle a Engineer, sitedeterminar su IMC y a Personnel officerlograr o Pharmacologistmantener un peso saludable. Haga ejercicio  con regularidad Haga ejercicio con regularidad. Esta es una de las prcticas ms importantes que puede hacer por su salud. La mayora de los adultos deben seguir estas pautas:  Education officer, environmentalealizar, al menos, 150minutos de actividad fsica por semana. El ejercicio debe aumentar la frecuencia cardaca y Media plannerhacerlo transpirar (ejercicio de intensidad moderada).  Hacer ejercicios de fortalecimiento por lo [employer] por semana. Agregue esto a su plan de ejercicio de intensidad moderada.  Pasar menos tiempo sentados. Incluso la actividad fsica ligera puede ser beneficiosa. Controle sus niveles de colesterol y lpidos en la sangre Comience a realizarse anlisis de lpidos y Oncologist en la sangre a los 20aos y luego reptalos cada 5aos. Hgase controlar los niveles de colesterol con mayor frecuencia si:  Sus niveles de lpidos y colesterol son altos.  Es mayor de 40aos.  Presenta un alto riesgo de padecer enfermedades cardacas. Qu debo saber sobre las pruebas de deteccin del cncer? Segn su historia clnica y sus antecedentes familiares, es posible que deba realizarse pruebas de deteccin del cncer en diferentes edades. Esto puede incluir pruebas de deteccin de lo siguiente:  Cncer de mama.  Cncer de cuello uterino.  Cncer colorrectal.  Cncer de piel.  Cncer de pulmn. Qu debo saber sobre la enfermedad cardaca, la diabetes y la hipertensin arterial? Presin arterial y enfermedad cardaca  La hipertensin arterial causa enfermedades cardacas y [place] el riesgo de accidente cerebrovascular. Es ms probable que esto se manifieste en las personas que tienen lecturas de presin arterial alta, tienen ascendencia africana o tienen sobrepeso.  Hgase controlar la presin arterial: ? Cada 3 a 5 aos si tiene entre 18 y 55 aos. ? Todos los aos si es mayor de [place]. Diabetes Realcese exmenes de deteccin de la diabetes con regularidad. Este anlisis revisa el nivel de  azcar en la sangre en [place]. Hgase las pruebas de deteccin:  Cada tresaos despus de los 40aos de edad si tiene un peso normal y un bajo riesgo de padecer diabetes.  Con ms frecuencia y a partir de [place] edad inferior si tiene sobrepeso o un alto riesgo de padecer diabetes. Qu debo saber sobre la prevencin de infecciones? Hepatitis B Si tiene un riesgo ms alto de contraer hepatitis B, debe someterse a un examen de deteccin de este virus. Hable con el mdico para averiguar si tiene riesgo de contraer la infeccin por hepatitis B. Hepatitis C Se recomienda el anlisis a:  [employer] 1945 y 1965.  Todas las personas que tengan un riesgo de haber contrado hepatitis C. Enfermedades de transmisin sexual (ETS)  Hgase las pruebas de Airline pilot de ITS, incluidas la gonorrea y la clamidia, si: ? Es sexualmente activa y es menor de [place]. ? Es mayor de 24aos, y Public affairs consultant informa que corre riesgo de tener este tipo de infecciones. ? La actividad sexual ha cambiado desde que le hicieron la ltima prueba de deteccin y tiene un riesgo mayor de Warehouse manager clamidia o Copy. Pregntele al mdico si usted tiene riesgo.  Pregntele al mdico si usted tiene un alto riesgo de Primary school teacher VIH. El mdico tambin puede recomendarle un medicamento recetado para ayudar a evitar la infeccin por el VIH. Si elige tomar medicamentos para prevenir el VIH, primero debe [employer] de deteccin del VIH. Luego debe hacerse anlisis cada mientras est tomando los medicamentos. Embarazo  Si est por dejar de Armed forces training and education officer (fase premenopusica) y usted puede quedar [place], busque asesoramiento antes de [place].  Tome de 400 a (mcg) de cido [employer] si [place].  Pida mtodos de control de la natalidad (anticonceptivos) si desea evitar un embarazo no deseado. Osteoporosis y [place] La osteoporosis es una enfermedad en la  que los huesos pierden los minerales y la fuerza por el avance de la  edad. El resultado pueden ser fracturas en los [place]. Si tiene 65aos o ms, o si est en riesgo de sufrir osteoporosis y fracturas, pregunte a su mdico si debe:  Hacerse pruebas de deteccin de prdida sea.  Tomar un suplemento de calcio o de vitamina D para reducir el riesgo de fracturas.  Recibir terapia de reemplazo hormonal (TRH) para tratar los sntomas de la menopausia. Siga estas instrucciones en su casa: Estilo de vida  No consuma ningn producto que contenga nicotina o tabaco, como cigarrillos, cigarrillos electrnicos y tabaco de Theatre manager. Si necesita ayuda para dejar de fumar, consulte al mdico.  No consuma drogas.  No comparta agujas.  Solicite ayuda a su mdico si necesita apoyo o informacin para abandonar las drogas. Consumo de alcohol  No beba alcohol si: ? Su mdico le indica no hacerlo. ? Est embarazada, puede estar embarazada o est tratando de quedar embarazada.  Si bebe alcohol: ? Limite la cantidad que consume de 0 a 1 medida por da. ? Limite la ingesta si est amamantando.  Est atento a la cantidad de alcohol que hay en las bebidas que toma. En los [place], una medida equivale a una botella de cerveza de 12oz ( ), un vaso de vino de 5oz ( ) o un vaso de una bebida alcohlica de alta graduacin de 1oz (58ml). Instrucciones generales  Realcese los estudios de rutina de la salud, dentales y de Wellsite geologist.  Mantngase al da con las vacunas.  Infrmele a su mdico si: ? Se siente deprimida con frecuencia. ? Alguna vez ha sido vctima de [place] o no se siente segura en su casa. Resumen  Adoptar un estilo de vida saludable y recibir atencin preventiva son importantes para promover la salud y Counsellor.  Siga las instrucciones del mdico acerca de una dieta saludable, el ejercicio y la realizacin de pruebas o exmenes para Hotel manager.  Siga las  instrucciones del mdico con respecto al control del colesterol y la presin arterial. Esta informacin no tiene Theme park manager el consejo del mdico. Asegrese de hacerle al mdico cualquier pregunta que tenga. Document Revised: 12/06/2018 Document Reviewed: 12/06/2018 Elsevier Patient Education  2021 Elsevier Inc.  Colelitiasis Cholelithiasis  La colelitiasis ocurre cuando se forman clculos biliares en la vescula biliar. La vescula biliar almacena bilis. La bilis es un lquido que ayuda a [employer]. La bilis puede endurecerse y transformarse en clculos biliares. Si estos causan una obstruccin, Magazine features editor (ataque de vescula biliar). Cules son las causas? Esta afeccin puede ser causada por lo siguiente:  Algunas enfermedades de la sangre, como la anemia drepanoctica.  Demasiada cantidad de una sustancia parecida a la grasa (colesterol) en la bilis.  No tener suficiente cantidad de sales biliares en la bilis. Estas sales le ayudan al organismo a Environmental health practitioner y a Mining engineer.  La vescula biliar no se vaca completamente o con suficiente frecuencia. Esto es frecuente en las mujeres embarazadas. Qu incrementa el riesgo? Los siguientes factores pueden hacer que sea ms propenso a Clinical cytogeneticist afeccin:  Ser mujer.  Estar embarazada muchas veces.  Comer muchos alimentos fritos, grasas y carbohidratos refinados.  Tener mucho sobrepeso (obesidad).  Ser mayor de 40aos de edad.  Usar medicamentos con hormonas femeninas durante [place].  Adelgazar rpidamente.  Tener clculos biliares en la familia.  Tener algunos problemas de [place], como diabetes, enfermedad de Cro[place] o enfermedad heptica. Cules son los signos o sntomas? A menudo, puede haber clculos biliares, pero sin sntomas.  Estos clculos biliares se denominan clculos silenciosos. Si un clculo biliar provoca una obstruccin, puede sentir dolor repentino. El dolor:  Puede  estar en la parte superior derecha del vientre (abdomen).  Normalmente aparece a la noche o despus de comer.  Puede durar Neomia Dear hora o ms.  Se puede extender hacia el hombro derecho, la espalda o el pecho.  Puede sentirse Radiographer, therapeutic, ardor o sensacin de plenitud en la parte superior del vientre (indigestin). Si la obstruccin dura ms de algunas horas, puede tener una infeccin o hinchazn. Usted puede:  Sentir que va a vomitar.  Vomitar.  Sentirse hinchado.  Tener dolor en el vientre durante 5 horas o ms.  Sentir dolor con la palpacin en el vientre, a menudo en la parte superior derecha y debajo de las Accident.  Tener fiebre o escalofros.  Notar que la piel o la zona blanca de los ojos se vuelven amarillas (ictericia).  Tener el pis (orina) oscuro o las deposiciones (heces) plidas. Cmo se trata? El tratamiento de esta afeccin depende de qu tan mal se sienta. Si tiene sntomas, puede necesitar lo siguiente:  Medical laboratory scientific officer, si los sntomas no son Lynnae Sandhoff graves. ? No coma durante 12 a 24horas. Beba solamente agua y lquidos claros. ? Comience a comer alimentos simples o claros despus de 1 o 2 das. Pruebe con caldos y galletas. ? Es posible que necesite medicamentos para Chief Technology Officer o Counsellor, o ambos. ? Si tiene una infeccin, necesitar antibiticos.  Hospitalizacin, si tiene un dolor muy intenso o una infeccin muy grave.  [place] para extirpar la vescula biliar. Puede ser necesario si: ? Los clculos biliares siguen apareciendo. ? Tiene sntomas muy graves.  Medicamentos para destruir los clculos biliares. Medicamentos: ? Son mejores para los clculos pequeos. ? Pueden usarse durante hasta 6 a 12 meses.  Un procedimiento para encontrar y extraer los clculos biliares o para fragmentarlos. Siga estas instrucciones en su casa: Medicamentos  Use los medicamentos de venta libre y los recetados solamente como se lo haya indicado el  mdico.  Si le recetaron un antibitico, tmelo como se lo haya indicado el mdico. No deje de tomar el antibitico aunque comience a sentirse mejor.  Pregntele al mdico si el medicamento recetado le impide conducir o usar [place]. Comida y bebida  Beba suficiente lquido como para Pharmacologist la orina de color amarillo plido. Beba agua o lquidos claros. Esto es importante cuando Electronics engineer.  Consuma alimentos saludables. Elija: ? Menos alimentos grasos, como las comidas fritas. ? Menos carbohidratos refinados. Evite los panes y los cereales muy procesados, como el pan blanco y el arroz blanco. Elija cereales integrales, como el pan integral y [place] integral. ? Ms Bjorn Loser. Las [place], las frutas frescas y los frijoles son fuentes saludables. Instrucciones generales  Mantenga un peso saludable.  Concurra a todas las visitas de [address] se lo haya indicado el mdico. Esto es importante. Dnde buscar ms informacin  [employer] of Diabetes and Digestive and Kidney Diseases [employer] de la Diabetes y las Enfermedades Digestivas y [place]): CarFlippers.tn Comunquese con un mdico si:  Siente dolor repentino en el costado superior derecho del vientre. El dolor podra extenderse hasta el hombro derecho, la espalda o el pecho.  Le han diagnosticado clculos biliares que no presentan sntomas y tiene lo siguiente: ? Dolor abdominal. ? Molestias, ardor o sensacin de plenitud en la parte superior del abdomen.  La orina es de color oscuro o tiene heces  plidas. Solicite ayuda de inmediato si:  Tiene dolor repentino en la parte superior derecha del abdomen y Chief Technology Officer dura ms de 2horas.  Tiene dolor en el abdomen y: ? Dura ms de 5horas. ? Sigue empeorando.  Tiene fiebre o escalofros.  Tiene ganas continuas de vomitar.  Sigue vomitando.  La piel y la parte blanca de los ojos se ponen amarillos. Resumen  La colelitiasis ocurre cuando se  forman clculos biliares en la vescula biliar.  La causa de esta afeccin puede ser Neomia Dear enfermedad de la [place], una cantidad excesiva de una sustancia parecida a la grasa en la bilis o una cantidad insuficiente de sales biliares.  El tratamiento de esta afeccin depende de qu tan mal se sienta.  Si tiene sntomas, no coma ni beba. Es posible que necesite tomar medicamentos. Tal vez necesite hospitalizacin si tiene un dolor muy intenso o una infeccin muy grave.  Es posible que deba someterse a una ciruga si los clculos siguen apareciendo o si tiene sntomas muy graves. Esta informacin no tiene Theme park manager el consejo del mdico. Asegrese de hacerle al mdico cualquier pregunta que tenga. Document Revised: 12/21/2019 Document Reviewed: 12/21/2019 Elsevier Patient Education  2021 Elsevier Inc.      Edwina Barth, MD [facility]

## 2020-12-17 NOTE — Assessment & Plan Note (Signed)
Well-controlled hypertension.  Continue Hyzaar 50-12.5 mg daily. Follow-up in 6 months.

## 2020-12-17 NOTE — Assessment & Plan Note (Signed)
Under the care of endocrinologist.  Synthroid dose reduced to 125 mcg daily recently.  Has follow-up appointment with endocrinologist soon.

## 2020-12-17 NOTE — Assessment & Plan Note (Signed)
Elevated alkaline phosphatase and liver enzymes in April 2021.  Ultrasound done in 2017 showed gallstones.  Asymptomatic.  Low-fat diet discussed with patient. Treatment options discussed with patient.

## 2020-12-17 NOTE — Assessment & Plan Note (Signed)
Not fasting today.  Previous lipid profile is abnormal.  We will start atorvastatin 10 mg daily.  Diet and nutrition discussed.

## 2020-12-17 NOTE — Patient Instructions (Addendum)
If you have lab work done today you will be contacted with your lab results within the next 2 weeks.  If you have not heard from [place] then please contact us. The fastest way to get your results is to register for My Chart.   IF you received an x-ray today, you will receive an invoice from [facility]. Please contact [facility] at [phone] with questions or concerns regarding your invoice.   IF you received labwork today, you will receive an invoice from [facility]. Please contact [facility] at [phone] with questions or concerns regarding your invoice.   Our billing staff will not be able to assist you with questions regarding bills from these companies.  You will be contacted with the lab results as soon as they are available. The fastest way to get your results is to activate your My Chart account. Instructions are located on the last page of this paperwork. If you have not heard from [place] regarding the results in 2 weeks, please contact this office.     Mantenimiento de Radiographer, therapeutic en las mujeres Health Maintenance, Female Adoptar un estilo de vida saludable y recibir atencin preventiva son importantes para promover la salud y Counsellor. Consulte al mdico sobre:  El esquema adecuado para hacerse pruebas y exmenes peridicos.  Cosas que puede hacer por su cuenta para prevenir enfermedades y [employer]. Qu debo saber sobre la dieta, el peso y el ejercicio? Consuma una dieta saludable  Consuma una dieta que incluya muchas verduras, frutas, productos lcteos con bajo contenido de [place] y Associate Professor.  No consuma muchos alimentos ricos en grasas slidas, azcares agregados o sodio.   Mantenga un peso saludable El ndice de masa muscular [facility]) se [place] para identificar problemas de [place]. Proporciona una estimacin de la grasa corporal basndose en el peso y la altura. Su mdico puede ayudarle a Engineer, site IMC y a Personnel officer o Pharmacologist un peso  saludable. Haga ejercicio con regularidad Haga ejercicio con regularidad. Esta es una de las prcticas ms importantes que puede hacer por su salud. La mayora de los adultos deben seguir estas pautas:  Education officer, environmental, al menos, de actividad fsica por semana. El ejercicio debe aumentar la frecuencia cardaca y Media planner transpirar (ejercicio de intensidad moderada).  Hacer ejercicios de fortalecimiento por lo [employer] por semana. Agregue esto a su plan de ejercicio de intensidad moderada.  Pasar menos tiempo sentados. Incluso la actividad fsica ligera puede ser beneficiosa. Controle sus niveles de colesterol y lpidos en la sangre Comience a realizarse anlisis de lpidos y Oncologist en la sangre a los 20aos y luego reptalos cada 5aos. Hgase controlar los niveles de colesterol con mayor frecuencia si:  Sus niveles de lpidos y colesterol son altos.  Es mayor de 40aos.  Presenta un alto riesgo de padecer enfermedades cardacas. Qu debo saber sobre las pruebas de deteccin del cncer? Segn su historia clnica y sus antecedentes familiares, es posible que deba realizarse pruebas de deteccin del cncer en diferentes edades. Esto puede incluir pruebas de deteccin de lo siguiente:  Cncer de mama.  Cncer de cuello uterino.  Cncer colorrectal.  Cncer de piel.  Cncer de pulmn. Qu debo saber sobre la enfermedad cardaca, la diabetes y la hipertensin arterial? Presin arterial y enfermedad cardaca  La hipertensin arterial causa enfermedades cardacas y [place] el riesgo de accidente cerebrovascular. Es ms probable que esto se manifieste en las personas que tienen lecturas de presin arterial alta, tienen ascendencia africana  o tienen sobrepeso.  Hgase controlar la presin arterial: ? Cada 3 a 5 aos si tiene entre 18 y 2 aos. ? Todos los aos si es mayor de [place]. Diabetes Realcese exmenes de deteccin de la diabetes con regularidad. Este  anlisis revisa el nivel de azcar en la sangre en [place]. Hgase las pruebas de deteccin:  Cada tresaos despus de los 60aos de edad si tiene un peso normal y un bajo riesgo de padecer diabetes.  Con ms frecuencia y a partir de [place] edad inferior si tiene sobrepeso o un alto riesgo de padecer diabetes. Qu debo saber sobre la prevencin de infecciones? Hepatitis B Si tiene un riesgo ms alto de contraer hepatitis B, debe someterse a un examen de deteccin de este virus. Hable con el mdico para averiguar si tiene riesgo de contraer la infeccin por hepatitis B. Hepatitis C Se recomienda el anlisis a:  [employer] 1945 y 1965.  Todas las personas que tengan un riesgo de haber contrado hepatitis C. Enfermedades de transmisin sexual (ETS)  Hgase las pruebas de Programme researcher, broadcasting/film/video de ITS, incluidas la gonorrea y la clamidia, si: ? Es sexualmente activa y es menor de [place]. ? Es mayor de 24aos, y Investment banker, operational informa que corre riesgo de tener este tipo de infecciones. ? La actividad sexual ha cambiado desde que le hicieron la ltima prueba de deteccin y tiene un riesgo mayor de Best boy clamidia o Radio broadcast assistant. Pregntele al mdico si usted tiene riesgo.  Pregntele al mdico si usted tiene un alto riesgo de Museum/gallery curator VIH. El mdico tambin puede recomendarle un medicamento recetado para ayudar a evitar la infeccin por el VIH. Si elige tomar medicamentos para prevenir el VIH, primero debe [employer] de deteccin del VIH. Luego debe hacerse anlisis cada 40meses mientras est tomando los medicamentos. Embarazo  Si est por dejar de Librarian, academic (fase premenopusica) y usted puede quedar [place], busque asesoramiento antes de [place].  Tome de 400 a [id number] (mcg) de cido [employer] si [place].  Pida mtodos de control de la natalidad (anticonceptivos) si desea evitar un embarazo no deseado. Osteoporosis y [place] La  osteoporosis es una enfermedad en la que los huesos pierden los minerales y la fuerza por el avance de la edad. El resultado pueden ser fracturas en los [place]. Si tiene 65aos o ms, o si est en riesgo de sufrir osteoporosis y fracturas, pregunte a su mdico si debe:  Hacerse pruebas de deteccin de prdida sea.  Tomar un suplemento de calcio o de vitamina D para reducir el riesgo de fracturas.  Recibir terapia de reemplazo hormonal (TRH) para tratar los sntomas de la menopausia. Siga estas instrucciones en su casa: Estilo de vida  No consuma ningn producto que contenga nicotina o tabaco, como cigarrillos, cigarrillos electrnicos y tabaco de Higher education careers adviser. Si necesita ayuda para dejar de fumar, consulte al mdico.  No consuma drogas.  No comparta agujas.  Solicite ayuda a su mdico si necesita apoyo o informacin para abandonar las drogas. Consumo de alcohol  No beba alcohol si: ? Su mdico le indica no hacerlo. ? Est embarazada, puede estar embarazada o est tratando de quedar embarazada.  Si bebe alcohol: ? Limite la cantidad que consume de 0 a 1 medida por da. ? Limite la ingesta si est amamantando.  Est atento a la cantidad de alcohol que hay en las bebidas que toma. En los [place], una medida equivale a una botella de Chartered certified accountant de Tax inspector (  ), un vaso de vino de 5oz ( ) o un vaso de una bebida alcohlica de alta graduacin de 1oz (70ml). Instrucciones generales  Realcese los estudios de rutina de la salud, dentales y de Wellsite geologist.  Mantngase al da con las vacunas.  Infrmele a su mdico si: ? Se siente deprimida con frecuencia. ? Alguna vez ha sido vctima de [place] o no se siente segura en su casa. Resumen  Adoptar un estilo de vida saludable y recibir atencin preventiva son importantes para promover la salud y Counsellor.  Siga las instrucciones del mdico acerca de una dieta saludable, el ejercicio y la realizacin de pruebas o exmenes  para Hotel manager.  Siga las instrucciones del mdico con respecto al control del colesterol y la presin arterial. Esta informacin no tiene Theme park manager el consejo del mdico. Asegrese de hacerle al mdico cualquier pregunta que tenga. Document Revised: 12/06/2018 Document Reviewed: 12/06/2018 Elsevier Patient Education  2021 Elsevier Inc.  Colelitiasis Cholelithiasis  La colelitiasis ocurre cuando se forman clculos biliares en la vescula biliar. La vescula biliar almacena bilis. La bilis es un lquido que ayuda a [employer]. La bilis puede endurecerse y transformarse en clculos biliares. Si estos causan una obstruccin, Magazine features editor (ataque de vescula biliar). Cules son las causas? Esta afeccin puede ser causada por lo siguiente:  Algunas enfermedades de la sangre, como la anemia drepanoctica.  Demasiada cantidad de una sustancia parecida a la grasa (colesterol) en la bilis.  No tener suficiente cantidad de sales biliares en la bilis. Estas sales le ayudan al organismo a Environmental health practitioner y a Mining engineer.  La vescula biliar no se vaca completamente o con suficiente frecuencia. Esto es frecuente en las mujeres embarazadas. Qu incrementa el riesgo? Los siguientes factores pueden hacer que sea ms propenso a Clinical cytogeneticist afeccin:  Ser mujer.  Estar embarazada muchas veces.  Comer muchos alimentos fritos, grasas y carbohidratos refinados.  Tener mucho sobrepeso (obesidad).  Ser mayor de 40aos de edad.  Usar medicamentos con hormonas femeninas durante [place].  Adelgazar rpidamente.  Tener clculos biliares en la familia.  Tener algunos problemas de [place], como diabetes, enfermedad de [place] o enfermedad heptica. Cules son los signos o sntomas? A menudo, puede haber clculos biliares, pero sin sntomas. Estos clculos biliares se denominan clculos silenciosos. Si un clculo biliar provoca una obstruccin, puede sentir  dolor repentino. El dolor:  Puede estar en la parte superior derecha del vientre (abdomen).  Normalmente aparece a la noche o despus de comer.  Puede durar Neomia Dear hora o ms.  Se puede extender hacia el hombro derecho, la espalda o el pecho.  Puede sentirse Radiographer, therapeutic, ardor o sensacin de plenitud en la parte superior del vientre (indigestin). Si la obstruccin dura ms de algunas horas, puede tener una infeccin o hinchazn. Usted puede:  Sentir que va a vomitar.  Vomitar.  Sentirse hinchado.  Tener dolor en el vientre durante 5 horas o ms.  Sentir dolor con la palpacin en el vientre, a menudo en la parte superior derecha y debajo de las [place].  Tener fiebre o escalofros.  Notar que la piel o la zona blanca de los ojos se vuelven amarillas (ictericia).  Tener el pis (orina) oscuro o las deposiciones (heces) plidas. Cmo se trata? El tratamiento de esta afeccin depende de qu tan mal se sienta. Si tiene sntomas, puede necesitar lo siguiente:  Medical laboratory scientific officer, si los sntomas no son Lynnae Sandhoff graves. ? No coma durante  12 a 24horas. Beba solamente agua y lquidos claros. ? Comience a comer alimentos simples o claros despus de 1 o 2 das. Pruebe con caldos y galletas. ? Es posible que necesite medicamentos para Chief Technology Officer o Counsellor, o ambos. ? Si tiene una infeccin, necesitar antibiticos.  Hospitalizacin, si tiene un dolor muy intenso o una infeccin muy grave.  [place] para extirpar la vescula biliar. Puede ser necesario si: ? Los clculos biliares siguen apareciendo. ? Tiene sntomas muy graves.  Medicamentos para destruir los clculos biliares. Medicamentos: ? Son mejores para los clculos pequeos. ? Pueden usarse durante hasta 6 a 12 meses.  Un procedimiento para encontrar y extraer los clculos biliares o para fragmentarlos. Siga estas instrucciones en su casa: Medicamentos  Use los medicamentos de venta libre y los recetados  solamente como se lo haya indicado el mdico.  Si le recetaron un antibitico, tmelo como se lo haya indicado el mdico. No deje de tomar el antibitico aunque comience a sentirse mejor.  Pregntele al mdico si el medicamento recetado le impide conducir o usar [place]. Comida y bebida  Beba suficiente lquido como para Pharmacologist la orina de color amarillo plido. Beba agua o lquidos claros. Esto es importante cuando Electronics engineer.  Consuma alimentos saludables. Elija: ? Menos alimentos grasos, como las comidas fritas. ? Menos carbohidratos refinados. Evite los panes y los cereales muy procesados, como el pan blanco y el arroz blanco. Elija cereales integrales, como el pan integral y [place] integral. ? Ms Bjorn Loser. [place], las frutas frescas y los frijoles son fuentes saludables. Instrucciones generales  Mantenga un peso saludable.  Concurra a todas las visitas de [address] se lo haya indicado el mdico. Esto es importante. Dnde buscar ms informacin  [employer] of Diabetes and Digestive and Kidney Diseases [employer] de la Diabetes y las Enfermedades Digestivas y [place]): CarFlippers.tn Comunquese con un mdico si:  Siente dolor repentino en el costado superior derecho del vientre. El dolor podra extenderse hasta el hombro derecho, la espalda o el pecho.  Le han diagnosticado clculos biliares que no presentan sntomas y tiene lo siguiente: ? Dolor abdominal. ? Molestias, ardor o sensacin de plenitud en la parte superior del abdomen.  La orina es de color oscuro o tiene heces plidas. Solicite ayuda de inmediato si:  Tiene dolor repentino en la parte superior derecha del abdomen y Chief Technology Officer dura ms de 2horas.  Tiene dolor en el abdomen y: ? Dura ms de 5horas. ? Sigue empeorando.  Tiene fiebre o escalofros.  Tiene ganas continuas de vomitar.  Sigue vomitando.  La piel y la parte blanca de los ojos se ponen  amarillos. Resumen  La colelitiasis ocurre cuando se forman clculos biliares en la vescula biliar.  La causa de esta afeccin puede ser Neomia Dear enfermedad de la [place], una cantidad excesiva de una sustancia parecida a la grasa en la bilis o una cantidad insuficiente de sales biliares.  El tratamiento de esta afeccin depende de qu tan mal se sienta.  Si tiene sntomas, no coma ni beba. Es posible que necesite tomar medicamentos. Tal vez necesite hospitalizacin si tiene un dolor muy intenso o una infeccin muy grave.  Es posible que deba someterse a una ciruga si los clculos siguen apareciendo o si tiene sntomas muy graves. Esta informacin no tiene Theme park manager el consejo del mdico. Asegrese de hacerle al mdico cualquier pregunta que tenga. Document Revised: 12/21/2019 Document Reviewed: 12/21/2019 Elsevier Patient Education  2021 Elsevier  Inc.  

## 2020-12-18 LAB — COMPREHENSIVE METABOLIC PANEL
ALT: 69 IU/L — ABNORMAL HIGH (ref 0–32)
AST: 33 IU/L (ref 0–40)
Albumin/Globulin Ratio: 1.8 (ref 1.2–2.2)
Albumin: 4.8 g/dL (ref 3.8–4.8)
Alkaline Phosphatase: 100 IU/L (ref 44–121)
BUN/Creatinine Ratio: 20 (ref 9–23)
BUN: 18 mg/dL (ref 6–24)
Bilirubin Total: 0.5 mg/dL (ref 0.0–1.2)
CO2: 23 mmol/L (ref 20–29)
Calcium: 9.7 mg/dL (ref 8.7–10.2)
Chloride: 98 mmol/L (ref 96–106)
Creatinine, Ser: 0.91 mg/dL (ref 0.57–1.00)
GFR calc Af Amer: 88 mL/min/{1.73_m2} (ref >59)
GFR calc non Af Amer: 76 mL/min/{1.73_m2} (ref >59)
Globulin, Total: 2.6 g/dL (ref 1.5–4.5)
Glucose: 122 mg/dL — ABNORMAL HIGH (ref 65–99)
Potassium: 3.8 mmol/L (ref 3.5–5.2)
Sodium: 141 mmol/L (ref 134–144)
Total Protein: 7.4 g/dL (ref 6.0–8.5)

## 2020-12-18 LAB — CBC WITH DIFFERENTIAL/PLATELET
Basophils Absolute: 0 10*3/uL (ref 0.0–0.2)
Basos: 0 %
EOS (ABSOLUTE): 0 10*3/uL (ref 0.0–0.4)
Eos: 0 %
Hematocrit: 50.2 % — ABNORMAL HIGH (ref 34.0–46.6)
Hemoglobin: 16.5 g/dL — ABNORMAL HIGH (ref 11.1–15.9)
Immature Grans (Abs): 0 10*3/uL (ref 0.0–0.1)
Immature Granulocytes: 0 %
Lymphocytes Absolute: 1.4 10*3/uL (ref 0.7–3.1)
Lymphs: 26 %
MCH: 28.2 pg (ref 26.6–33.0)
MCHC: 32.9 g/dL (ref 31.5–35.7)
MCV: 86 fL (ref 79–97)
Monocytes Absolute: 0.4 10*3/uL (ref 0.1–0.9)
Monocytes: 8 %
Neutrophils Absolute: 3.4 10*3/uL (ref 1.4–7.0)
Neutrophils: 66 %
Platelets: 332 10*3/uL (ref 150–450)
RBC: 5.86 x10E6/uL — ABNORMAL HIGH (ref 3.77–5.28)
RDW: 12.9 % (ref 11.7–15.4)
WBC: 5.3 10*3/uL (ref 3.4–10.8)

## 2020-12-18 LAB — VITAMIN D 25 HYDROXY (VIT D DEFICIENCY, FRACTURES): Vit D, 25-Hydroxy: 20.7 ng/mL — ABNORMAL LOW (ref 30.0–100.0)

## 2020-12-21 ENCOUNTER — Telehealth: Payer: Self-pay | Admitting: Emergency Medicine

## 2020-12-21 NOTE — Telephone Encounter (Signed)
Blood results discussed with patient. 

## 2020-12-25 ENCOUNTER — Other Ambulatory Visit: Payer: Self-pay

## 2020-12-25 ENCOUNTER — Ambulatory Visit: Payer: Self-pay | Admitting: *Deleted

## 2020-12-25 ENCOUNTER — Ambulatory Visit
Admission: RE | Admit: 2020-12-25 | Discharge: 2020-12-25 | Disposition: A | Discharge status: Home / Self Care | Payer: No Typology Code available for payment source | Source: Ambulatory Visit | Attending: Obstetrics and Gynecology | Admitting: Obstetrics and Gynecology

## 2020-12-25 VITALS — BP 128/84 | Wt 132.3 lb

## 2020-12-25 DIAGNOSIS — Z1239 Encounter for other screening for malignant neoplasm of breast: Secondary | ICD-10-CM

## 2020-12-25 DIAGNOSIS — Z1231 Encounter for screening mammogram for malignant neoplasm of breast: Secondary | ICD-10-CM

## 2020-12-25 NOTE — Progress Notes (Signed)
Ms. Nikole Swartzentruber Ardine Eng is a 45 y.o. female who presents to [facility] clinic today with no complaints.    Pap Smear: Pap smear not completed today. Last Pap smear was 8/29/2017and normal with negative HPV at [facility]. Per patient has no history of an abnormal Pap smear. Last Pap smear result is availablein Epic.   Physical exam: Breasts Breasts symmetrical. No skin abnormalities bilateral breasts. No nipple retraction bilateral breasts. No nipple discharge bilateral breasts. No lymphadenopathy. No lumps palpated bilateral breasts. No complaints of pain or tenderness on exam.       Pelvic/Bimanual Pap is not indicated today per BCCCP guidelines.    Smoking History: Patient has never smoked.   Patient Navigation: Patient education provided. Access to services provided for patient through [place] program. Spanish interpreter Natale Lay from [facility] provided.    Breast and Cervical Cancer Risk Assessment: Patient does not have family history of breast cancer, known genetic mutations, or radiation treatment to the chest before age 69. Patient does not have history of cervical dysplasia, immunocompromised, or DES exposure in-utero.  Risk Assessment    Risk Scores      12/25/2020 10/16/2019   Last edited by: Narda Rutherford, LPN Lynia Landry, Carlye Grippe, RN   5-year risk: 0.6 % 0.5 %   Lifetime risk: 6.9 % 6.9 %          A: BCCCP exam without pap smear No complaints.  P: Referred patient to the [facility] for a screening mammogram on the mobile unit. Appointment scheduled Thursday, December 25, 2020 at 1500.  Priscille Heidelberg, RN 12/25/2020 2:18 PM

## 2020-12-25 NOTE — Patient Instructions (Addendum)
Explained breast self awareness with Teresa Michael. Patient did not need a Pap smear today due to last Pap smear and HPV typing was 07/27/2016. Let her know BCCCP will cover Pap smears and HPV typing every 5 years unless has a history of abnormal Pap smears. Referred patient to the [facility] for a screening mammogram on the mobile unit. Appointment scheduled Thursday, December 25, 2020 at 1500. Patient escorted to the mobile unit following BCCCP appointment for her screening mammogram. Let patient know the Breast Center will follow up with her within the next couple weeks with results of her mammogram by letter or phone. Teresa Michael verbalized understanding.  Nic Lampe, Kathaleen Maser, RN 2:18 PM

## 2021-03-12 NOTE — Telephone Encounter (Signed)
Opened in error

## 2021-05-28 ENCOUNTER — Ambulatory Visit: Payer: No Typology Code available for payment source | Admitting: Emergency Medicine

## 2021-06-08 ENCOUNTER — Ambulatory Visit: Payer: No Typology Code available for payment source | Admitting: Emergency Medicine

## 2021-06-11 ENCOUNTER — Encounter: Payer: Self-pay | Admitting: Emergency Medicine

## 2021-06-11 ENCOUNTER — Other Ambulatory Visit: Payer: Self-pay

## 2021-06-11 ENCOUNTER — Ambulatory Visit (INDEPENDENT_AMBULATORY_CARE_PROVIDER_SITE_OTHER): Payer: Self-pay | Admitting: Emergency Medicine

## 2021-06-11 VITALS — BP 124/82 | HR 75 | Temp 97.9°F | Resp 18 | Ht 59.0 in | Wt 133.0 lb

## 2021-06-11 DIAGNOSIS — H9192 Unspecified hearing loss, left ear: Secondary | ICD-10-CM

## 2021-06-11 NOTE — Progress Notes (Signed)
Teresa Michael 45 y.o.   Chief Complaint  Patient presents with   Left Ear Concerns    Muffled hearing. Sensitive to certain noises.     HISTORY OF PRESENT ILLNESS: This is a 45 y.o. female complaining of muffled hearing from the left ear for several weeks. No other associated symptomatology.  HPI   Prior to Admission medications   Medication Sig Start Date End Date Taking? Authorizing Provider  ALPRAZolam (XANAX) 0.25 MG tablet Take 1 tablet (0.25 mg total) by mouth 2 (two) times daily as needed for anxiety. 09/16/20  Yes Dinara Lupu, Eilleen Kempf, MD  atorvastatin (LIPITOR) 10 MG tablet Take 1 tablet (10 mg total) by mouth daily. 12/17/20  Yes Toriann Spadoni, Eilleen Kempf, MD  docusate sodium (COLACE) 100 MG capsule Take 1 capsule (100 mg total) by mouth daily as needed for mild constipation (take with iron supplement). 03/09/20  Yes Doristine Bosworth, MD  ferrous sulfate (FERROUSUL) 325 (65 FE) MG tablet Take 1 tablet (325 mg total) by mouth daily with breakfast. 03/09/20  Yes Stallings, Zoe A, MD  fluticasone (FLONASE) 50 MCG/ACT nasal spray Place 2 sprays into both nostrils daily. 12/04/18  Yes Peyton Najjar, MD  hydrOXYzine (ATARAX/VISTARIL) 10 MG tablet Take one po qhs 05/09/19  Yes Corum, Minerva Fester, MD  levothyroxine (SYNTHROID) 112 MCG tablet Take 112 mcg by mouth daily before breakfast.   Yes [provider]  loratadine (CLARITIN) 10 MG tablet Take 10 mg by mouth daily.    Yes [provider]  losartan-hydrochlorothiazide (HYZAAR) 50-12.5 MG tablet Take 1 tablet by mouth daily. 10/22/20  Yes Hydie Langan, Eilleen Kempf, MD  Multiple Vitamin (MULTIVITAMIN) tablet Take 1 tablet by mouth daily.   Yes [provider]  ofloxacin (OCUFLOX) 0.3 % ophthalmic solution Place 1 drop into the left eye 4 (four) times daily. 05/23/20  Yes Peyton Najjar, MD  Vitamin D, Ergocalciferol, (DRISDOL) 1.25 MG (50000 UNIT) CAPS capsule Take 1 capsule (50,000 Units total) by mouth every 7  (seven) days. 03/09/20  Yes Doristine Bosworth, MD    No Known Allergies  Patient Active Problem List   Diagnosis Date Noted   Gallstones 12/17/2020   Vitamin D deficiency 12/17/2020   Absolute anemia 12/17/2020   Elevated liver enzymes 12/17/2020   Situational anxiety 12/17/2020   Dyslipidemia 12/17/2020   Essential hypertension 05/09/2019   Acquired hypothyroidism 10/04/2013    Past Medical History:  Diagnosis Date   Hyperlipidemia    Hypertension    Thyroid disease     Past Surgical History:  Procedure Laterality Date   CESAREAN SECTION     2 previous    Social History   Socioeconomic History   Marital status: Married    Spouse name: Not on file   Number of children: 2   Years of education: 12   Highest education level: High school graduate  Occupational History   Occupation: Conservator, museum/gallery  Tobacco Use   Smoking status: Never   Smokeless tobacco: Never  Building services engineer Use: Never used  Substance and Sexual Activity   Alcohol use: Yes    Comment: special occasions   Drug use: No   Sexual activity: Yes    Partners: Male    Birth control/protection: Condom  Other Topics Concern   Not on file  Social History Narrative   Pt. Exercises regularly   Social Determinants of Health   Financial Resource Strain: Not on file  Food Insecurity: Not on file  Transportation Needs: No Regulatory affairs officer (Medical): No   Lack of Transportation (Non-Medical): No  Physical Activity: Not on file  Stress: Not on file  Social Connections: Not on file  Intimate Partner Violence: Not on file    Family History  Problem Relation Age of Onset   Depression Mother    Hypertension Mother    Hypertension Father    Hyperlipidemia Father    Depression Brother    Hypertension Maternal Grandfather      Review of Systems  Constitutional: Negative.  Negative for chills and fever.  HENT:  Positive for hearing loss. Negative for congestion, ear  pain and sore throat.   Respiratory: Negative.  Negative for cough and shortness of breath.   Cardiovascular: Negative.  Negative for chest pain and palpitations.  Gastrointestinal:  Negative for abdominal pain, diarrhea, nausea and vomiting.  Genitourinary:  Negative for dysuria and hematuria.  Skin: Negative.  Negative for rash.  Neurological:  Negative for dizziness and headaches.  All other systems reviewed and are negative.  Today's Vitals   06/11/21 1616  BP: 124/82  Pulse: 75  Resp: 18  Temp: 97.9 F (36.6 C)  TempSrc: Oral  SpO2: 99%  Weight: 133 lb (60.3 kg)  Height: 4\' 11"  (1.499 m)   Body mass index is 26.86 kg/m.  Physical Exam Vitals reviewed.  Constitutional:      Appearance: Normal appearance.  HENT:     Head: Normocephalic.     Right Ear: Tympanic membrane, ear canal and external ear normal.     Left Ear: Tympanic membrane, ear canal and external ear normal.  Eyes:     Extraocular Movements: Extraocular movements intact.     Conjunctiva/sclera: Conjunctivae normal.     Pupils: Pupils are equal, round, and reactive to light.  Neck:     Vascular: No carotid bruit.  Cardiovascular:     Rate and Rhythm: Normal rate and regular rhythm.     Pulses: Normal pulses.     Heart sounds: Normal heart sounds.  Pulmonary:     Effort: Pulmonary effort is normal.     Breath sounds: Normal breath sounds.  Musculoskeletal:     Cervical back: Normal range of motion and neck supple. No tenderness.  Lymphadenopathy:     Cervical: No cervical adenopathy.  Skin:    Capillary Refill: Capillary refill takes less than 2 seconds.  Neurological:     General: No focal deficit present.     Mental Status: She is alert and oriented to person, place, and time.  Psychiatric:        Mood and Affect: Mood normal.        Behavior: Behavior normal.     ASSESSMENT & PLAN: [place] was seen today for left ear concerns.  Diagnoses and all orders for this visit:  Hearing problem of  left ear -     Ambulatory referral to ENT Clinically stable.  No red flag signs or symptoms. Needs further evaluation by ENT. Patient Instructions  Prdida Jamas Lav Hearing Loss La prdida auditiva es la prdida total o parcial de la capacidad de or. Puedeser transitoria o permanente, y ocurrir en uno o en ambos odos. Se necesita atencin mdica para tratar correctamente la prdida Nancy Fetter y para evitar que la afeccin empeore. Es posible recuperar la audicin parcial o totalmente, en funcin de la causa de la prdida [place] y de su gravedad. Enalgunos casos la prdida [place]. Cules son las causas? Las  causas frecuentes de la prdida auditiva incluyen lo siguiente: Exceso de cerumen en el conducto auditivo externo. Infeccin del conducto auditivo externo o del odo medio. Lquido en el odo medio. Lesin en el odo o en la zona alrededor del odo. Un objeto atascado en el odo. Antecedentes de exposicin prolongada a ruidos fuertes, Psychologist, counselling. Las causas menos frecuentes de la prdida auditiva incluyen lo siguiente: Tumores en el odo. Infecciones bacterianas o virales, como meningitis. Un orificio en la membrana del tmpano (membrana del tmpano perforada). Problemas en el nervio auditivo que enva las seales entre el cerebro y el odo. Ciertos medicamentos. Cules son los signos o los sntomas? Los sntomas de esta afeccin pueden incluir los siguientes: Dificultad para Landscape architect diferencia entre los sonidos. Dificultad para seguir una conversacin cuando hay ruido ambiental. Ausencia de respuesta a los ruidos del entorno. Esto puede ser ms notorio cuando no hay respuesta a los ruidos inesperados. Necesidad de subir el volumen del Optician, dispensing, la radio u otros dispositivos. Pitidos en el odo. Mareos. Cmo se diagnostica? Esta afeccin se diagnostica en funcin de lo siguiente: Un examen fsico. Una prueba de audicin Allie Bossier). Un especialista  en audicin (audilogo) realizar la [place]. Es posible que lo deriven a un especialista en garganta, Darene Lamer y odo (otorrinolaringlogo). Cmo se trata? El tratamiento para la prdida de la audicin incluye lo siguiente: Extraccin del cerumen. Medicamentos para tratar o prevenir una infeccin (antibiticos). Medicamentos para reducir la inflamacin (corticoesteroides). Audfonos para la prdida [place] relacionada con el dao nervioso. Siga estas instrucciones en su casa: Si le recetaron un antibitico, tmelo como se lo haya indicado el mdico. No deje de tomar los antibiticos aunque comience a [place]. Tome los medicamentos de venta libre y los recetados solamente como se lo haya indicado el mdico. Evite los ruidos fuertes. Retome sus actividades normales segn lo indicado por el mdico. Pregntele al mdico qu actividades son seguras para usted. Concurra a todas las visitas de [address] se lo haya indicado el mdico. Esto es importante. Comunquese con un mdico si: Siente mareos. Tiene nuevos sntomas. Vomita o siente nuseas. Tiene fiebre. Solicite ayuda inmediatamente si: Percibe cambios repentinos en la visin. Tiene dolor intenso de odos. Aumenta su fatiga o debilidad. Tiene un dolor de cabeza intenso. Resumen La prdida [place] es una disminucin de la capacidad de or sonidos a su alrededor. Puede ser transitoria o permanente. El tratamiento depender de la causa de la prdida [place]. Puede incluir la extraccin de cerumen, medicamentos o un audfono. Es posible recuperar la audicin parcial o totalmente, en funcin de la causa de la prdida [place] y de su gravedad. Concurra a todas las visitas de [address] se lo haya indicado el mdico. Esto es importante. Esta informacin no tiene Theme park manager el consejo del mdico. Asegresede hacerle al mdico cualquier pregunta que tenga. Document Revised: 09/28/2018 Document Reviewed:  09/28/2018 Elsevier Patient Education  2022 Elsevier Inc.    Edwina Barth, MD [facility]

## 2021-06-11 NOTE — Patient Instructions (Signed)
P?rdida auditiva ?Hearing Loss ?La p?rdida auditiva es la p?rdida total o parcial de la capacidad de o?r. Puede ser transitoria o permanente, y ocurrir en uno o en ambos o?dos. ?Se necesita atenci?n m?dica para tratar correctamente la p?rdida auditiva y para evitar que la afecci?n empeore. Es posible recuperar la audici?n parcial o totalmente, en funci?n de la causa de la p?rdida auditiva y de su gravedad. En algunos casos la p?rdida auditiva es permanente. ??Cu?les son las causas? ?Las causas frecuentes de la p?rdida auditiva incluyen lo siguiente: ?Exceso de cerumen en el conducto auditivo externo. ?Infecci?n del conducto auditivo externo o del o?do medio. ?L?quido en el o?do medio. ?Lesi?n en el o?do o en la zona alrededor del o?do. ?Un objeto atascado en el o?do. ?Antecedentes de exposici?n prolongada a ruidos fuertes, como la m?sica. ?Las causas menos frecuentes de la p?rdida auditiva incluyen lo siguiente: ?Tumores en el o?do. ?Infecciones bacterianas o virales, como meningitis. ?Un orificio en la membrana del t?mpano (membrana del t?mpano perforada). ?Problemas en el nervio auditivo que env?a las se?ales entre el cerebro y el o?do. ?Ciertos medicamentos. ??Cu?les son los signos o los s?ntomas? ?Los s?ntomas de esta afecci?n pueden incluir los siguientes: ?Dificultad para detectar la diferencia entre los sonidos. ?Dificultad para seguir una conversaci?n cuando hay ruido ambiental. ?Ausencia de respuesta a los ruidos del entorno. Esto puede ser m?s notorio cuando no hay respuesta a los ruidos inesperados. ?Necesidad de subir el volumen del televisor, la radio u otros dispositivos. ?Pitidos en el o?do. ?Mareos. ??C?mo se diagnostica? ?Esta afecci?n se diagnostica en funci?n de lo siguiente: ?Un examen f?sico. ?Una prueba de audici?n (audiometr?a). Un especialista en audici?n (audi?logo) realizar? la audiometr?a. ?Es posible que lo deriven a un especialista en garganta, nariz y o?do (otorrinolaring?logo). ??C?mo  se trata? ?El tratamiento para la p?rdida de la audici?n incluye lo siguiente: ?Extracci?n del cerumen. ?Medicamentos para tratar o prevenir una infecci?n (antibi?ticos). ?Medicamentos para reducir la inflamaci?n (corticoesteroides). ?Aud?fonos para la p?rdida auditiva relacionada con el da?o nervioso. ?Siga estas instrucciones en su casa: ?Si le recetaron un antibi?tico, t?melo como se lo haya indicado el m?dico. No deje de tomar los antibi?ticos aunque comience a sentirse mejor. ?Tome los medicamentos de venta libre y los recetados solamente como se lo haya indicado el m?dico. ?Evite los ruidos fuertes. ?Retome sus actividades normales seg?n lo indicado por el m?dico. Preg?ntele al m?dico qu? actividades son seguras para usted. ?Concurra a todas las visitas de seguimiento como se lo haya indicado el m?dico. Esto es importante. ?Comun?quese con un m?dico si: ?Siente mareos. ?Tiene nuevos s?ntomas. ?Vomita o siente n?useas. ?Tiene fiebre. ?Solicite ayuda inmediatamente si: ?Percibe cambios repentinos en la visi?n. ?Tiene dolor intenso de o?dos. ?Aumenta su fatiga o debilidad. ?Tiene un dolor de cabeza intenso. ?Resumen ?La p?rdida auditiva es una disminuci?n de la capacidad de o?r sonidos a su alrededor. Puede ser transitoria o permanente. ?El tratamiento depender? de la causa de la p?rdida auditiva. Puede incluir la extracci?n de cerumen, medicamentos o un aud?fono. ?Es posible recuperar la audici?n parcial o totalmente, en funci?n de la causa de la p?rdida auditiva y de su gravedad. ?Concurra a todas las visitas de seguimiento como se lo haya indicado el m?dico. Esto es importante. ?Esta informaci?n no tiene como fin reemplazar el consejo del m?dico. Aseg?rese de hacerle al m?dico cualquier pregunta que tenga. ?Document Revised: 09/28/2018 Document Reviewed: 09/28/2018 ?Elsevier Patient Education ? 2022 Elsevier Inc. ? ?

## 2021-07-27 ENCOUNTER — Other Ambulatory Visit: Payer: Self-pay

## 2021-07-27 ENCOUNTER — Ambulatory Visit (INDEPENDENT_AMBULATORY_CARE_PROVIDER_SITE_OTHER): Payer: Self-pay | Admitting: Otolaryngology

## 2021-07-27 DIAGNOSIS — H9202 Otalgia, left ear: Secondary | ICD-10-CM

## 2021-07-27 NOTE — Progress Notes (Signed)
HPI: Teresa Michael is a 45 y.o. female who presents is referred by her PCP for evaluation of left ear complaints.  She states that certain sounds seemed to cause discomfort to her mostly in the left ear.  She has been treated with nasal steroid spray as well as antihistamine thinking this might be eustachian tube problem.  She has had no drainage from the ear and no real sinus symptoms. She presents today with an interpreter although she understands English fairly well. She has been under a lot of stress associated with her family this past year..  Past Medical History:  Diagnosis Date   Hyperlipidemia    Hypertension    Thyroid disease    Past Surgical History:  Procedure Laterality Date   CESAREAN SECTION     2 previous   Social History   Socioeconomic History   Marital status: Married    Spouse name: Not on file   Number of children: 2   Years of education: 12   Highest education level: High school graduate  Occupational History   Occupation: Conservator, museum/gallery  Tobacco Use   Smoking status: Never   Smokeless tobacco: Never  Building services engineer Use: Never used  Substance and Sexual Activity   Alcohol use: Yes    Comment: special occasions   Drug use: No   Sexual activity: Yes    Partners: Male    Birth control/protection: Condom  Other Topics Concern   Not on file  Social History Narrative   Pt. Exercises regularly   Social Determinants of Health   Financial Resource Strain: Not on file  Food Insecurity: Not on file  Transportation Needs: No Transportation Needs   Lack of Transportation (Medical): No   Lack of Transportation (Non-Medical): No  Physical Activity: Not on file  Stress: Not on file  Social Connections: Not on file   Family History  Problem Relation Age of Onset   Depression Mother    Hypertension Mother    Hypertension Father    Hyperlipidemia Father    Depression Brother    Hypertension Maternal Grandfather    No Known Allergies Prior  to Admission medications   Medication Sig Start Date End Date Taking? Authorizing Provider  ALPRAZolam (XANAX) 0.25 MG tablet Take 1 tablet (0.25 mg total) by mouth 2 (two) times daily as needed for anxiety. 09/16/20   Georgina Quint, MD  atorvastatin (LIPITOR) 10 MG tablet Take 1 tablet (10 mg total) by mouth daily. 12/17/20   Georgina Quint, MD  docusate sodium (COLACE) 100 MG capsule Take 1 capsule (100 mg total) by mouth daily as needed for mild constipation (take with iron supplement). 03/09/20   Doristine Bosworth, MD  ferrous sulfate (FERROUSUL) 325 (65 FE) MG tablet Take 1 tablet (325 mg total) by mouth daily with breakfast. 03/09/20   Doristine Bosworth, MD  fluticasone (FLONASE) 50 MCG/ACT nasal spray Place 2 sprays into both nostrils daily. 12/04/18   Peyton Najjar, MD  hydrOXYzine (ATARAX/VISTARIL) 10 MG tablet Take one po qhs 05/09/19   Corum, Minerva Fester, MD  levothyroxine (SYNTHROID) 112 MCG tablet Take 112 mcg by mouth daily before breakfast.    [provider]  loratadine (CLARITIN) 10 MG tablet Take 10 mg by mouth daily.     [provider]  losartan-hydrochlorothiazide (HYZAAR) 50-12.5 MG tablet Take 1 tablet by mouth daily. 10/22/20   Georgina Quint, MD  Multiple Vitamin (MULTIVITAMIN) tablet Take 1 tablet by mouth  daily.    [provider]  ofloxacin (OCUFLOX) 0.3 % ophthalmic solution Place 1 drop into the left eye 4 (four) times daily. 05/23/20   Peyton Najjar, MD  Vitamin D, Ergocalciferol, (DRISDOL) 1.25 MG (50000 UNIT) CAPS capsule Take 1 capsule (50,000 Units total) by mouth every 7 (seven) days. 03/09/20   Doristine Bosworth, MD     Positive ROS: Otherwise negative  All other systems have been reviewed and were otherwise negative with the exception of those mentioned in the HPI and as above.  Physical Exam: Constitutional: Alert, well-appearing, no acute distress Ears: External ears without lesions or tenderness. Ear canals are clear  bilaterally with intact, clear TMs.  Middle ear spaces clear bilaterally with good mobility on pneumatic otoscopy. Nasal: External nose without lesions. Septum with minimal deformity and mild rhinitis.  Both middle meatus regions are clear.. Clear nasal passages Oral: Lips and gums without lesions. Tongue and palate mucosa without lesions. Posterior oropharynx clear. Neck: No palpable adenopathy or masses Respiratory: Breathing comfortably  Skin: No facial/neck lesions or rash noted.  Audiologic testing demonstrated symmetric normal hearing in both ears with type A tympanograms bilaterally.  Procedures  Assessment: Left ear complaints with normal examination and normal audiologic testing.  Plan: I discussed with her today that I feel this is probably more stress related. This is probably more of a central processing problem than any abnormal ear abnormality which is normal to examination with normal hearing evaluation. Discussed with her concerning avoiding noises that are bothersome.   Narda Bonds, MD   CC:

## 2021-09-03 ENCOUNTER — Encounter (INDEPENDENT_AMBULATORY_CARE_PROVIDER_SITE_OTHER): Payer: Self-pay

## 2021-09-29 ENCOUNTER — Encounter: Payer: Self-pay | Admitting: Emergency Medicine

## 2021-09-29 ENCOUNTER — Other Ambulatory Visit: Payer: Self-pay

## 2021-09-29 ENCOUNTER — Ambulatory Visit (INDEPENDENT_AMBULATORY_CARE_PROVIDER_SITE_OTHER): Payer: Self-pay | Admitting: Emergency Medicine

## 2021-09-29 VITALS — BP 118/80 | HR 64 | Ht 59.0 in | Wt 138.0 lb

## 2021-09-29 DIAGNOSIS — H1013 Acute atopic conjunctivitis, bilateral: Secondary | ICD-10-CM

## 2021-09-29 DIAGNOSIS — Z23 Encounter for immunization: Secondary | ICD-10-CM

## 2021-09-29 DIAGNOSIS — R519 Headache, unspecified: Secondary | ICD-10-CM

## 2021-09-29 MED ORDER — AZELASTINE HCL 0.05 % OP SOLN: 1.0000 [drp] | Freq: Two times a day (BID) | OPHTHALMIC | 3 refills | Status: AC

## 2021-09-29 NOTE — Patient Instructions (Signed)
Conjuntivitis alrgica en los adultos Allergic Conjunctivitis, Adult La conjuntivitis alrgica es la inflamacin de la membrana transparente que cubre la parte blanca del ojo y la cara interna del prpado (conjuntiva). Esta afeccin puede hacer que los ojos tengan un color rojo o rosa. Tambin puede causar [employer] ojos. Esta afeccin no se puede transmitir de [place] persona a la otra (no es contagiosa). Cules son las causas? Esta afeccin es causada por alrgenos. Estos son cosas que pueden causar una reaccin alrgica en algunas personas, pero no en otras. Entre los alrgenos ms comunes se encuentran los siguientes: Aeronautical engineer de exterior, por ejemplo: Polen, incluido el polen del csped y Theme park manager. Moho. Humo de vehculos. Alrgenos de interiores, por ejemplo: [place]. Humo. Moho. Protenas en el pis (orina), la saliva o la caspa de [place]. Qu incrementa el riesgo? Es ms probable que desarrolle esta afeccin si usted tiene antecedentes familiares de estas cosas: Alergias. Enfermedades que se contraen debido a alrgenos, como asma o inflamacin de la piel (eczema). Cules son los signos o sntomas? Los sntomas de esta afeccin incluyen ojos: Con picazn. Enrojecidos. Lagrimosos. Hinchados. Los ojos tambin pueden: Arder o causar un dolor punzante. Tener un lquido claro que drena de ellos. Tener una mucosidad espesa que sale de ellos. Cmo se trata? El tratamiento de esta afeccin puede incluir: Paos fros y hmedos (compresas fras) para Technical sales engineer picazn y la hinchazn. Lavar la cara para eliminar alrgenos. Gotas oftlmicas. Estas incluyen: Gotas oftlmicas que bloquean las alergias. Gotas oftlmicas que reducen la hinchazn y Youth worker. Gotas oftlmicas con corticoesteroides si no han funcionado otros tratamientos. Antihistamnicos por va oral. Estos medicamentos [employer]. Puede necesitarlos si las gotas oftlmicas no ayudan o son difciles  de Water engineer. Siga estas instrucciones en su casa: Cuidado del ojo Aplquese un pao limpio y fro en el ojo durante 10 a 20 minutos. Hgalo 3 o 4 veces al da. No se toque ni se frote los ojos. No use lentes de contacto hasta que la inflamacin haya desaparecido. En su lugar, use anteojos. No use maquillaje en los ojos hasta que la inflamacin haya desaparecido. Instrucciones generales Intente mantenerse alejado de aquello a lo que tiene [place]. Tome o aplique los medicamentos de venta libre y los recetados solamente como se lo haya indicado el mdico. Estos incluyen cualquier gota oftlmica. Beba suficiente lquido como para Pharmacologist la orina de color amarillo plido. Concurra a todas las visitas de [address] se lo haya indicado el mdico. Esto es importante. Comunquese con un mdico si: Sus sntomas empeoran. Los sntomas no mejoran con Scientist, research (medical). Tiene dolor leve en el ojo. Tiene sensibilidad a Statistician. Tiene manchas o ampollas en los ojos. Le sale pus del ojo. Tiene fiebre. Solicite ayuda de inmediato si: Tiene enrojecimiento, hinchazn u otros sntomas en un solo ojo. No puede ver bien. Tiene otros cambios en la visin. Siente mucho dolor en el ojo. Resumen La conjuntivitis alrgica es causada por alrgenos. Puede producir enrojecimiento o una coloracin rosada de los ojos y Presenter, broadcasting. Esta afeccin no se puede transmitir de [place] persona a la otra (no es contagiosa). Intente mantenerse alejado de aquello a lo que tiene [place]. Tome o aplique los medicamentos de venta libre y los recetados solamente como se lo haya indicado el mdico. Estos incluyen cualquier gota oftlmica. Comunquese con el mdico si los sntomas empeoran o no mejoran con el tratamiento. Esta informacin no tiene Theme park manager el consejo del mdico. Asegrese de  hacerle al mdico cualquier pregunta que tenga. Document Revised: 12/21/2019 Document Reviewed: 12/21/2019 Elsevier Patient  Education  2022 [employer].

## 2021-09-29 NOTE — Progress Notes (Signed)
Teresa Michael 45 y.o.   Chief Complaint  Patient presents with   Eye Problem    Pt states both eyes feel irritated, watery, and red at times/ x 3 weeks.   Headache    HISTORY OF PRESENT ILLNESS: This is a 45 y.o. female complaining of watery itchy eyes for the past 3 weeks. Also getting intermittent right-sided headaches, not cluster like. No other complaints or medical concerns today.  Eye Problem  Associated symptoms include eye redness. Pertinent negatives include no blurred vision, eye discharge, double vision, fever, nausea, photophobia, vomiting or weakness.  Headache  Associated symptoms include eye redness. Pertinent negatives include no abdominal pain, blurred vision, coughing, dizziness, eye pain, fever, nausea, photophobia, sore throat, vomiting or weakness.    Prior to Admission medications   Medication Sig Start Date End Date Taking? Authorizing Provider  ALPRAZolam (XANAX) 0.25 MG tablet Take 1 tablet (0.25 mg total) by mouth 2 (two) times daily as needed for anxiety. 09/16/20   Horald Pollen, MD  atorvastatin (LIPITOR) 10 MG tablet Take 1 tablet (10 mg total) by mouth daily. 12/17/20   Horald Pollen, MD  docusate sodium (COLACE) 100 MG capsule Take 1 capsule (100 mg total) by mouth daily as needed for mild constipation (take with iron supplement). 03/09/20   Forrest Moron, MD  ferrous sulfate (FERROUSUL) 325 (65 FE) MG tablet Take 1 tablet (325 mg total) by mouth daily with breakfast. 03/09/20   Forrest Moron, MD  fluticasone (FLONASE) 50 MCG/ACT nasal spray Place 2 sprays into both nostrils daily. 12/04/18   Posey Boyer, MD  hydrOXYzine (ATARAX/VISTARIL) 10 MG tablet Take one po qhs 05/09/19   Corum, Rex Kras, MD  levothyroxine (SYNTHROID) 112 MCG tablet Take 112 mcg by mouth daily before breakfast.    [provider]  loratadine (CLARITIN) 10 MG tablet Take 10 mg by mouth daily.     [provider]   losartan-hydrochlorothiazide (HYZAAR) 50-12.5 MG tablet Take 1 tablet by mouth daily. 10/22/20   Horald Pollen, MD  Multiple Vitamin (MULTIVITAMIN) tablet Take 1 tablet by mouth daily.    [provider]  ofloxacin (OCUFLOX) 0.3 % ophthalmic solution Place 1 drop into the left eye 4 (four) times daily. 05/23/20   Posey Boyer, MD  Vitamin D, Ergocalciferol, (DRISDOL) 1.25 MG (50000 UNIT) CAPS capsule Take 1 capsule (50,000 Units total) by mouth every 7 (seven) days. 03/09/20   Forrest Moron, MD    No Known Allergies  Patient Active Problem List   Diagnosis Date Noted   Gallstones 12/17/2020   Vitamin D deficiency 12/17/2020   Absolute anemia 12/17/2020   Elevated liver enzymes 12/17/2020   Situational anxiety 12/17/2020   Dyslipidemia 12/17/2020   Essential hypertension 05/09/2019   Acquired hypothyroidism 10/04/2013    Past Medical History:  Diagnosis Date   Hyperlipidemia    Hypertension    Thyroid disease     Past Surgical History:  Procedure Laterality Date   CESAREAN SECTION     2 previous    Social History   Socioeconomic History   Marital status: Married    Spouse name: Not on file   Number of children: 2   Years of education: 12   Highest education level: High school graduate  Occupational History   Occupation: Clinical cytogeneticist  Tobacco Use   Smoking status: Never   Smokeless tobacco: Never  Vaping Use   Vaping Use: Never used  Substance and Sexual Activity  Alcohol use: Yes    Comment: special occasions   Drug use: No   Sexual activity: Yes    Partners: Male    Birth control/protection: Condom  Other Topics Concern   Not on file  Social History Narrative   Pt. Exercises regularly   Social Determinants of Health   Financial Resource Strain: Not on file  Food Insecurity: Not on file  Transportation Needs: No Transportation Needs   Lack of Transportation (Medical): No   Lack of Transportation (Non-Medical): No  Physical  Activity: Not on file  Stress: Not on file  Social Connections: Not on file  Intimate Partner Violence: Not on file    Family History  Problem Relation Age of Onset   Depression Mother    Hypertension Mother    Hypertension Father    Hyperlipidemia Father    Depression Brother    Hypertension Maternal Grandfather      Review of Systems  Constitutional: Negative.  Negative for chills and fever.  HENT: Negative.  Negative for congestion and sore throat.   Eyes:  Positive for redness. Negative for blurred vision, double vision, photophobia, pain and discharge.  Respiratory: Negative.  Negative for cough and shortness of breath.   Cardiovascular: Negative.  Negative for chest pain and palpitations.  Gastrointestinal:  Negative for abdominal pain, diarrhea, nausea and vomiting.  Genitourinary: Negative.  Negative for dysuria and hematuria.  Skin: Negative.  Negative for rash.  Neurological:  Positive for headaches. Negative for dizziness, focal weakness, loss of consciousness and weakness.  All other systems reviewed and are negative.   Physical Exam Vitals reviewed.  Constitutional:      Appearance: She is well-developed.  HENT:     Head: Normocephalic.     Right Ear: Tympanic membrane, ear canal and external ear normal.     Left Ear: Tympanic membrane, ear canal and external ear normal.     Mouth/Throat:     Mouth: Mucous membranes are moist.     Pharynx: Oropharynx is clear.  Eyes:     Extraocular Movements: Extraocular movements intact.     Conjunctiva/sclera: Conjunctivae normal.     Pupils: Pupils are equal, round, and reactive to light.  Cardiovascular:     Rate and Rhythm: Normal rate and regular rhythm.     Pulses: Normal pulses.     Heart sounds: Normal heart sounds.  Pulmonary:     Effort: Pulmonary effort is normal.     Breath sounds: Normal breath sounds.  Musculoskeletal:        General: Normal range of motion.     Cervical back: Normal range of motion  and neck supple. No tenderness.     Right lower leg: No edema.     Left lower leg: No edema.  Lymphadenopathy:     Cervical: No cervical adenopathy.  Skin:    General: Skin is warm and dry.     Capillary Refill: Capillary refill takes less than 2 seconds.  Neurological:     General: No focal deficit present.     Mental Status: She is alert and oriented to person, place, and time.  Psychiatric:        Mood and Affect: Mood normal.        Behavior: Behavior normal.     ASSESSMENT & PLAN: Problem List Items Addressed This Visit   None Visit Diagnoses     Allergic conjunctivitis of both eyes    -  Primary   Relevant Medications   azelastine (  OPTIVAR) 0.05 % ophthalmic solution   Need for influenza vaccination       Relevant Orders   Flu Vaccine QUAD [id number] (Fluarix, Fluzone & Alfiuria Quad PF)   Intermittent headache         Clinically stable.  No red flag signs or symptoms.  Benign headaches without any significant findings.  Advised to take Tylenol and or Advil as needed.  Eyedrops as prescribed.  Monitor symptoms for the next couple of weeks and if not better will refer to ophthalmology.   Patient Instructions  Conjuntivitis alrgica en los adultos Allergic Conjunctivitis, Adult La conjuntivitis alrgica es la inflamacin de la membrana transparente que cubre la parte blanca del ojo y la cara interna del prpado (conjuntiva). Esta afeccin puede hacer que los ojos tengan un color rojo o rosa. Tambin puede causar The Procter & Gamblepicazn en los ojos. Esta afeccin no se puede transmitir de [place] persona a la otra (no es contagiosa). Cules son las causas? Esta afeccin es causada por alrgenos. Estos son cosas que pueden causar una reaccin alrgica en algunas personas, pero no en otras. Entre los alrgenos ms comunes se encuentran los siguientes: Aeronautical engineerAlrgenos de exterior, por ejemplo: Polen, incluido el polen del csped y Theme park managerla maleza. Moho. Humo de vehculos. Alrgenos de interiores, por  ejemplo: Polvo. Humo. Moho. Protenas en el pis (orina), la saliva o la caspa de [place] Islanduna mascota. Qu incrementa el riesgo? Es ms probable que desarrolle esta afeccin si usted tiene antecedentes familiares de estas cosas: Alergias. Enfermedades que se contraen debido a alrgenos, como asma o inflamacin de la piel (eczema). Cules son los signos o sntomas? Los sntomas de esta afeccin incluyen ojos: Con picazn. Enrojecidos. Lagrimosos. Hinchados. Los ojos tambin pueden: Arder o causar un dolor punzante. Tener un lquido claro que drena de ellos. Tener una mucosidad espesa que sale de ellos. Cmo se trata? El tratamiento de esta afeccin puede incluir: Paos fros y hmedos (compresas fras) para Technical sales engineeraliviar la picazn y la hinchazn. Lavar la cara para eliminar alrgenos. Gotas oftlmicas. Estas incluyen: Gotas oftlmicas que bloquean las alergias. Gotas oftlmicas que reducen la hinchazn y Youth workerla irritacin. Gotas oftlmicas con corticoesteroides si no han funcionado otros tratamientos. Antihistamnicos por va oral. Estos medicamentos Temple-Inlandalivian las alergias. Puede necesitarlos si las gotas oftlmicas no ayudan o son difciles de Water engineerusar. Siga estas instrucciones en su casa: Cuidado del ojo Aplquese un pao limpio y fro en el ojo durante 10 a 20 minutos. Hgalo 3 o 4 veces al da. No se toque ni se frote los ojos. No use lentes de contacto hasta que la inflamacin haya desaparecido. En su lugar, use anteojos. No use maquillaje en los ojos hasta que la inflamacin haya desaparecido. Instrucciones generales Intente mantenerse alejado de aquello a lo que tiene [place]ergia. Tome o aplique los medicamentos de venta libre y los recetados solamente como se lo haya indicado el mdico. Estos incluyen cualquier gota oftlmica. Beba suficiente lquido como para Pharmacologistmantener la orina de color amarillo plido. Concurra a todas las visitas de [address] West Eldor[address] Parkwayseguimiento como se lo haya indicado el mdico. Esto es  importante. Comunquese con un mdico si: Sus sntomas empeoran. Los sntomas no mejoran con Scientist, research (medical)el tratamiento. Tiene dolor leve en el ojo. Tiene sensibilidad a Statisticianla luz. Tiene manchas o ampollas en los ojos. Le sale pus del ojo. Tiene fiebre. Solicite ayuda de inmediato si: Tiene enrojecimiento, hinchazn u otros sntomas en un solo ojo. No puede ver bien. Tiene otros cambios en la visin. Siente mucho dolor en el ojo. Resumen  La conjuntivitis alrgica es causada por alrgenos. Puede producir enrojecimiento o una coloracin rosada de los ojos y Presenter, broadcasting. Esta afeccin no se puede transmitir de [place] persona a la otra (no es contagiosa). Intente mantenerse alejado de aquello a lo que tiene [place]. Tome o aplique los medicamentos de venta libre y los recetados solamente como se lo haya indicado el mdico. Estos incluyen cualquier gota oftlmica. Comunquese con el mdico si los sntomas empeoran o no mejoran con el tratamiento. Esta informacin no tiene Theme park manager el consejo del mdico. Asegrese de hacerle al mdico cualquier pregunta que tenga. Document Revised: 12/21/2019 Document Reviewed: 12/21/2019 Elsevier Patient Education  2022 Elsevier Inc.    Edwina Barth, MD [facility]

## 2021-12-03 ENCOUNTER — Other Ambulatory Visit: Payer: Self-pay

## 2021-12-03 ENCOUNTER — Ambulatory Visit (INDEPENDENT_AMBULATORY_CARE_PROVIDER_SITE_OTHER): Payer: Self-pay | Admitting: Emergency Medicine

## 2021-12-03 ENCOUNTER — Encounter: Payer: Self-pay | Admitting: Emergency Medicine

## 2021-12-03 VITALS — BP 126/86 | HR 69 | Ht 59.0 in | Wt 136.0 lb

## 2021-12-03 DIAGNOSIS — N951 Menopausal and female climacteric states: Secondary | ICD-10-CM

## 2021-12-03 DIAGNOSIS — I10 Essential (primary) hypertension: Secondary | ICD-10-CM

## 2021-12-03 DIAGNOSIS — R1011 Right upper quadrant pain: Secondary | ICD-10-CM | POA: Insufficient documentation

## 2021-12-03 DIAGNOSIS — Z8719 Personal history of other diseases of the digestive system: Secondary | ICD-10-CM | POA: Insufficient documentation

## 2021-12-03 DIAGNOSIS — E039 Hypothyroidism, unspecified: Secondary | ICD-10-CM

## 2021-12-03 LAB — CBC WITH DIFFERENTIAL/PLATELET
Basophils Absolute: 0 10*3/uL (ref 0.0–0.1)
Basophils Relative: 0.3 % (ref 0.0–3.0)
Eosinophils Absolute: 0 10*3/uL (ref 0.0–0.7)
Eosinophils Relative: 0.1 % (ref 0.0–5.0)
HCT: 43.6 % (ref 36.0–46.0)
Hemoglobin: 14.4 g/dL (ref 12.0–15.0)
Lymphocytes Relative: 20.8 % (ref 12.0–46.0)
Lymphs Abs: 1.4 10*3/uL (ref 0.7–4.0)
MCHC: 33.1 g/dL (ref 30.0–36.0)
MCV: 80.9 fl (ref 78.0–100.0)
Monocytes Absolute: 0.6 10*3/uL (ref 0.1–1.0)
Monocytes Relative: 9 % (ref 3.0–12.0)
Neutro Abs: 4.6 10*3/uL (ref 1.4–7.7)
Neutrophils Relative %: 69.8 % (ref 43.0–77.0)
Platelets: 330 10*3/uL (ref 150.0–400.0)
RBC: 5.39 Mil/uL — ABNORMAL HIGH (ref 3.87–5.11)
RDW: 14.4 % (ref 11.5–15.5)
WBC: 6.6 10*3/uL (ref 4.0–10.5)

## 2021-12-03 LAB — POCT URINALYSIS DIP (MANUAL ENTRY)
Bilirubin, UA: NEGATIVE
Glucose, UA: NEGATIVE mg/dL
Ketones, POC UA: NEGATIVE mg/dL
Leukocytes, UA: NEGATIVE
Nitrite, UA: NEGATIVE
Protein Ur, POC: NEGATIVE mg/dL
Spec Grav, UA: >=1.03 — AB (ref 1.010–1.025)
Urobilinogen, UA: 0.2 E.U./dL
pH, UA: 6 (ref 5.0–8.0)

## 2021-12-03 LAB — COMPREHENSIVE METABOLIC PANEL
ALT: 87 U/L — ABNORMAL HIGH (ref 0–35)
AST: 36 U/L (ref 0–37)
Albumin: 4.4 g/dL (ref 3.5–5.2)
Alkaline Phosphatase: 120 U/L — ABNORMAL HIGH (ref 39–117)
BUN: 11 mg/dL (ref 6–23)
CO2: 30 mEq/L (ref 19–32)
Calcium: 9.5 mg/dL (ref 8.4–10.5)
Chloride: 97 mEq/L (ref 96–112)
Creatinine, Ser: 0.83 mg/dL (ref 0.40–1.20)
GFR: 84.42 mL/min (ref >60.00)
Glucose, Bld: 115 mg/dL — ABNORMAL HIGH (ref 70–99)
Potassium: 3.3 mEq/L — ABNORMAL LOW (ref 3.5–5.1)
Sodium: 137 mEq/L (ref 135–145)
Total Bilirubin: 0.6 mg/dL (ref 0.2–1.2)
Total Protein: 7.9 g/dL (ref 6.0–8.3)

## 2021-12-03 LAB — TSH: TSH: 4.88 u[IU]/mL (ref 0.35–5.50)

## 2021-12-03 LAB — POCT URINE PREGNANCY: Preg Test, Ur: NEGATIVE

## 2021-12-03 NOTE — Assessment & Plan Note (Signed)
Multiple perimenopausal symptoms.  Needs GYN evaluation.

## 2021-12-03 NOTE — Patient Instructions (Signed)
Colelitiasis Cholelithiasis  La colelitiasis ocurre cuando se forman clculos biliares en la vescula biliar. La vescula biliar almacena bilis. La bilis es un lquido que ayuda a digerir las grasas. La bilis puede endurecerse y transformarse en clculos biliares. Si estos causan una obstruccin, pueden provocar dolor (ataque de vescula biliar). Cules son las causas? Esta afeccin puede ser causada por lo siguiente: Algunas enfermedades de la sangre, como la anemia drepanoctica. Demasiada cantidad de una sustancia parecida a la grasa (colesterol) en la bilis. No tener suficiente cantidad de sales biliares en la bilis. Estas sales le ayudan al organismo a absorber y a digerir las grasas. La vescula biliar no se vaca completamente o con suficiente frecuencia. Esto es frecuente en las mujeres embarazadas. Qu incrementa el riesgo? Los siguientes factores pueden hacer que sea ms propenso a contraer esta afeccin: Ser mujer. Estar embarazada muchas veces. Comer muchos alimentos fritos, grasas y carbohidratos refinados. Tener mucho sobrepeso (obesidad). Ser mayor de 40 aos de edad. Usar medicamentos con hormonas femeninas durante mucho tiempo. Adelgazar rpidamente. Tener clculos biliares en la familia. Tener algunos problemas de salud, como diabetes, enfermedad de Crohn o enfermedad heptica. Cules son los signos o sntomas? A menudo, puede haber clculos biliares, pero sin sntomas. Estos clculos biliares se denominan clculos silenciosos. Si un clculo biliar provoca una obstruccin, puede sentir dolor repentino. El dolor: Puede estar en la parte superior derecha del vientre (abdomen). Normalmente aparece a la noche o despus de comer. Puede durar una hora o ms. Se puede extender hacia el hombro derecho, la espalda o el pecho. Puede sentirse como molestias, ardor o sensacin de plenitud en la parte superior del vientre (indigestin). Si la obstruccin dura ms de algunas horas,  puede tener una infeccin o hinchazn. Usted puede: Sentir que va a vomitar. Vomitar. Sentirse hinchado. Tener dolor en el vientre durante 5 horas o ms. Sentir dolor con la palpacin en el vientre, a menudo en la parte superior derecha y debajo de las costillas. Tener fiebre o escalofros. Notar que la piel o la zona blanca de los ojos se vuelven amarillas (ictericia). Tener el pis (orina) oscuro o las deposiciones (heces) plidas. Cmo se trata? El tratamiento de esta afeccin depende de qu tan mal se sienta. Si tiene sntomas, puede necesitar lo siguiente: Cuidados en el hogar, si los sntomas no son muy graves. No coma durante 12 a 24 horas. Beba solamente agua y lquidos claros. Comience a comer alimentos simples o claros despus de 1 o 2 das. Pruebe con caldos y galletas. Es posible que necesite medicamentos para el dolor o el malestar estomacal, o ambos. Si tiene una infeccin, necesitar antibiticos. Hospitalizacin, si tiene un dolor muy intenso o una infeccin muy grave. Ciruga para extirpar la vescula biliar. Puede ser necesario si: Los clculos biliares siguen apareciendo. Tiene sntomas muy graves. Medicamentos para destruir los clculos biliares. Medicamentos: Son mejores para los clculos pequeos. Pueden usarse durante hasta 6 a 12 meses. Un procedimiento para encontrar y extraer los clculos biliares o para fragmentarlos. Siga estas instrucciones en su casa: Medicamentos Use los medicamentos de venta libre y los recetados solamente como se lo haya indicado el mdico. Si le recetaron un antibitico, tmelo como se lo haya indicado el mdico. No deje de tomar el antibitico aunque comience a sentirse mejor. Pregntele al mdico si el medicamento recetado le impide conducir o usar maquinaria. Comida y bebida Beba suficiente lquido como para mantener la orina de color amarillo plido. Beba agua o lquidos claros. Esto   es importante cuando siente dolor. Consuma  alimentos saludables. Elija: Menos alimentos grasos, como las comidas fritas. Menos carbohidratos refinados. Evite los panes y los cereales muy procesados, como el pan blanco y el arroz blanco. Elija cereales integrales, como el pan integral y el arroz integral. Ms fibra. Las almendras, las frutas frescas y los frijoles son fuentes saludables. Instrucciones generales Mantenga un peso saludable. Concurra a todas las visitas de seguimiento como se lo haya indicado el mdico. Esto es importante. Dnde buscar ms informacin National Institute of Diabetes and Digestive and Kidney Diseases (Instituto Nacional de la Diabetes y las Enfermedades Digestivas y Renales): www.niddk.nih.gov Comunquese con un mdico si: Siente dolor repentino en el costado superior derecho del vientre. El dolor podra extenderse hasta el hombro derecho, la espalda o el pecho. Le han diagnosticado clculos biliares que no presentan sntomas y tiene lo siguiente: Dolor abdominal. Molestias, ardor o sensacin de plenitud en la parte superior del abdomen. La orina es de color oscuro o tiene heces plidas. Solicite ayuda de inmediato si: Tiene dolor repentino en la parte superior derecha del abdomen y el dolor dura ms de 2 horas. Tiene dolor en el abdomen y: Dura ms de 5 horas. Sigue empeorando. Tiene fiebre o escalofros. Tiene ganas continuas de vomitar. Sigue vomitando. La piel y la parte blanca de los ojos se ponen amarillos. Resumen La colelitiasis ocurre cuando se forman clculos biliares en la vescula biliar. La causa de esta afeccin puede ser una enfermedad de la sangre, una cantidad excesiva de una sustancia parecida a la grasa en la bilis o una cantidad insuficiente de sales biliares. El tratamiento de esta afeccin depende de qu tan mal se sienta. Si tiene sntomas, no coma ni beba. Es posible que necesite tomar medicamentos. Tal vez necesite hospitalizacin si tiene un dolor muy intenso o una infeccin  muy grave. Es posible que deba someterse a una ciruga si los clculos siguen apareciendo o si tiene sntomas muy graves. Esta informacin no tiene como fin reemplazar el consejo del mdico. Asegresede hacerle al mdico cualquier pregunta que tenga. Document Revised: 12/21/2019 Document Reviewed: 12/21/2019 Elsevier Patient Education  2022 Elsevier Inc.  

## 2021-12-03 NOTE — Assessment & Plan Note (Signed)
Well-controlled hypertension.  Continue Hyzaar 50-12.5 mg daily. BP Readings from Last 3 Encounters:  12/03/21 126/86  09/29/21 118/80  06/11/21 124/82

## 2021-12-03 NOTE — Assessment & Plan Note (Signed)
Clinically euthyroid.  Continue Synthroid 112 mcg daily.  TSH done today.

## 2021-12-03 NOTE — Assessment & Plan Note (Signed)
Differential diagnosis discussed.  Patient has history of gallstones as shown on ultrasound done in 2017.  Blood work done today. Recommend to repeat gallbladder ultrasound in the near future. Advised to avoid greasy/fried foods.

## 2021-12-03 NOTE — Progress Notes (Signed)
Teresa Michael 46 y.o.   Chief Complaint  Patient presents with   Abdominal Pain    Pt states it comes and goes, pt states she is concerned about menopause    HISTORY OF PRESENT ILLNESS: This is a 46 y.o. female complaining of short-lived episodes of pain to right upper quadrant area with some radiation into the right flank on and off for the past several weeks.  Still able to eat and drink.  No nausea or vomiting.  Denies diarrhea. Has occasional constipation.  No melena or rectal bleeding.  No hematemesis. Ultrasound done in 2017 showed gallstones.  No cholecystectomy. Also has concerns about possible menopausal symptoms, insomnia, hot flashes, decreased libido, and mood swings.  Starting to get irregular menstrual periods.  Has appointment to see gynecologist next month. Also has history of hypothyroidism on Synthroid 112 mcg daily. No other complaints or medical concerns today.  Abdominal Pain Associated symptoms include constipation. Pertinent negatives include no diarrhea, dysuria, fever, headaches, hematuria, melena, nausea or vomiting.    Prior to Admission medications   Medication Sig Start Date End Date Taking? Authorizing Provider  ALPRAZolam (XANAX) 0.25 MG tablet Take 1 tablet (0.25 mg total) by mouth 2 (two) times daily as needed for anxiety. 09/16/20  Yes Leolia Vinzant, Ines Bloomer, MD  atorvastatin (LIPITOR) 10 MG tablet Take 1 tablet (10 mg total) by mouth daily. 12/17/20  Yes Lonna Rabold, Ines Bloomer, MD  docusate sodium (COLACE) 100 MG capsule Take 1 capsule (100 mg total) by mouth daily as needed for mild constipation (take with iron supplement). 03/09/20  Yes Forrest Moron, MD  ferrous sulfate (FERROUSUL) 325 (65 FE) MG tablet Take 1 tablet (325 mg total) by mouth daily with breakfast. 03/09/20  Yes Stallings, Zoe A, MD  fluticasone (FLONASE) 50 MCG/ACT nasal spray Place 2 sprays into both nostrils daily. 12/04/18  Yes Posey Boyer, MD  hydrOXYzine (ATARAX/VISTARIL)  10 MG tablet Take one po qhs 05/09/19  Yes Corum, Rex Kras, MD  levothyroxine (SYNTHROID) 112 MCG tablet Take 112 mcg by mouth daily before breakfast.   Yes [provider]  loratadine (CLARITIN) 10 MG tablet Take 10 mg by mouth daily.    Yes [provider]  losartan-hydrochlorothiazide (HYZAAR) 50-12.5 MG tablet Take 1 tablet by mouth daily. 10/22/20  Yes Olyver Hawes, Ines Bloomer, MD  Multiple Vitamin (MULTIVITAMIN) tablet Take 1 tablet by mouth daily.   Yes [provider]  ofloxacin (OCUFLOX) 0.3 % ophthalmic solution Place 1 drop into the left eye 4 (four) times daily. 05/23/20  Yes Posey Boyer, MD  Vitamin D, Ergocalciferol, (DRISDOL) 1.25 MG (50000 UNIT) CAPS capsule Take 1 capsule (50,000 Units total) by mouth every 7 (seven) days. 03/09/20  Yes Forrest Moron, MD    No Known Allergies  Patient Active Problem List   Diagnosis Date Noted   Gallstones 12/17/2020   Vitamin D deficiency 12/17/2020   Absolute anemia 12/17/2020   Elevated liver enzymes 12/17/2020   Situational anxiety 12/17/2020   Dyslipidemia 12/17/2020   Essential hypertension 05/09/2019   Acquired hypothyroidism 10/04/2013    Past Medical History:  Diagnosis Date   Hyperlipidemia    Hypertension    Thyroid disease     Past Surgical History:  Procedure Laterality Date   CESAREAN SECTION     2 previous    Social History   Socioeconomic History   Marital status: Married    Spouse name: Not on file   Number of children: 2  Years of education: 5   Highest education level: High school graduate  Occupational History   Occupation: Clinical cytogeneticist  Tobacco Use   Smoking status: Never   Smokeless tobacco: Never  Vaping Use   Vaping Use: Never used  Substance and Sexual Activity   Alcohol use: Yes    Comment: special occasions   Drug use: No   Sexual activity: Yes    Partners: Male    Birth control/protection: Condom  Other Topics Concern   Not on file  Social History  Narrative   Pt. Exercises regularly   Social Determinants of Health   Financial Resource Strain: Not on file  Food Insecurity: Not on file  Transportation Needs: No Transportation Needs   Lack of Transportation (Medical): No   Lack of Transportation (Non-Medical): No  Physical Activity: Not on file  Stress: Not on file  Social Connections: Not on file  Intimate Partner Violence: Not on file    Family History  Problem Relation Age of Onset   Depression Mother    Hypertension Mother    Hypertension Father    Hyperlipidemia Father    Depression Brother    Hypertension Maternal Grandfather      Review of Systems  Constitutional: Negative.  Negative for chills and fever.  HENT: Negative.  Negative for congestion and sore throat.   Respiratory: Negative.  Negative for cough and shortness of breath.   Cardiovascular: Negative.  Negative for chest pain and palpitations.  Gastrointestinal:  Positive for abdominal pain and constipation. Negative for blood in stool, diarrhea, heartburn, melena, nausea and vomiting.  Genitourinary: Negative.  Negative for dysuria and hematuria.  Skin: Negative.  Negative for rash.  Neurological: Negative.  Negative for dizziness and headaches.  All other systems reviewed and are negative.  Today's Vitals   12/03/21 1024  BP: 126/86  Pulse: 69  SpO2: 99%  Weight: 136 lb (61.7 kg)  Height: 4\' 11"  (1.499 m)   Body mass index is 27.47 kg/m.  Physical Exam Vitals reviewed.  Constitutional:      Appearance: She is well-developed.  HENT:     Head: Normocephalic.     Mouth/Throat:     Mouth: Mucous membranes are moist.     Pharynx: Oropharynx is clear.  Eyes:     Extraocular Movements: Extraocular movements intact.     Conjunctiva/sclera: Conjunctivae normal.     Pupils: Pupils are equal, round, and reactive to light.  Cardiovascular:     Rate and Rhythm: Normal rate and regular rhythm.     Pulses: Normal pulses.     Heart sounds: Normal  heart sounds.  Pulmonary:     Effort: Pulmonary effort is normal.     Breath sounds: Normal breath sounds.  Abdominal:     General: There is no distension.     Palpations: Abdomen is soft. There is no mass.     Tenderness: There is no abdominal tenderness.  Musculoskeletal:        General: Normal range of motion.     Cervical back: Normal range of motion and neck supple.     Right lower leg: No edema.     Left lower leg: No edema.  Skin:    Capillary Refill: Capillary refill takes less than 2 seconds.  Neurological:     General: No focal deficit present.     Mental Status: She is alert and oriented to person, place, and time.  Psychiatric:        Mood and  Affect: Mood normal.        Behavior: Behavior normal.   Results for orders placed or performed in visit on 12/03/21 (from the past 24 hour(s))  POCT urine pregnancy     Status: None   Collection Time: 12/03/21 11:15 AM  Result Value Ref Range   Preg Test, Ur Negative Negative  POCT urinalysis dipstick     Status: Abnormal   Collection Time: 12/03/21 11:15 AM  Result Value Ref Range   Color, UA yellow yellow   Clarity, UA clear clear   Glucose, UA negative negative mg/dL   Bilirubin, UA negative negative   Ketones, POC UA negative negative mg/dL   Spec Grav, UA >=1.030 (A) 1.010 - 1.025   Blood, UA small (A) negative   pH, UA 6.0 5.0 - 8.0   Protein Ur, POC negative negative mg/dL   Urobilinogen, UA 0.2 0.2 or 1.0 E.U./dL   Nitrite, UA Negative Negative   Leukocytes, UA Negative Negative     ASSESSMENT & PLAN: Problem List Items Addressed This Visit       Cardiovascular and Mediastinum   Essential hypertension    Well-controlled hypertension.  Continue Hyzaar 50-12.5 mg daily. BP Readings from Last 3 Encounters:  12/03/21 126/86  09/29/21 118/80  06/11/21 124/82           Endocrine   Acquired hypothyroidism    Clinically euthyroid.  Continue Synthroid 112 mcg daily.  TSH done today.      Relevant  Orders   TSH     Other   Perimenopausal    Multiple perimenopausal symptoms.  Needs GYN evaluation.      History of gallstones   Relevant Orders   US Abdomen Limited RUQ (LIVER/GB)   Right upper quadrant abdominal pain - Primary    Differential diagnosis discussed.  Patient has history of gallstones as shown on ultrasound done in 2017.  Blood work done today. Recommend to repeat gallbladder ultrasound in the near future. Advised to avoid greasy/fried foods.      Relevant Orders   POCT urine pregnancy (Completed)   POCT urinalysis dipstick (Completed)   CBC with Differential/Platelet   Comprehensive metabolic panel   US Abdomen Limited RUQ (LIVER/GB)   Patient Instructions  Colelitiasis Cholelithiasis La colelitiasis ocurre cuando se forman clculos biliares en la vescula biliar. La vescula biliar almacena bilis. La bilis es un lquido que ayuda a [employer]. La bilis puede endurecerse y transformarse en clculos biliares. Si estos causan una obstruccin, Doctor, hospital (ataque de vescula biliar). Cules son las causas? Esta afeccin puede ser causada por lo siguiente: Algunas enfermedades de la sangre, como la anemia drepanoctica. Demasiada cantidad de una sustancia parecida a la grasa (colesterol) en la bilis. No tener suficiente cantidad de sales biliares en la bilis. Estas sales le ayudan al organismo a Tax adviser y a Chief Financial Officer. La vescula biliar no se vaca completamente o con suficiente frecuencia. Esto es frecuente en las mujeres embarazadas. Qu incrementa el riesgo? Los siguientes factores pueden hacer que sea ms propenso a Emergency planning/management officer afeccin: Ser mujer. Estar embarazada muchas veces. Comer muchos alimentos fritos, grasas y carbohidratos refinados. Tener mucho sobrepeso (obesidad). Ser mayor de 31 aos de edad. Usar medicamentos con hormonas femeninas durante [employer]. Adelgazar rpidamente. Tener clculos biliares en la  familia. Tener algunos problemas de [place], como diabetes, enfermedad de Crohn o enfermedad heptica. Cules son los signos o sntomas? A menudo, puede haber clculos biliares, pero sin sntomas. Estos  clculos biliares se denominan clculos silenciosos. Si un clculo biliar provoca una obstruccin, puede sentir dolor repentino. El dolor: Puede estar en la parte superior derecha del vientre (abdomen). Normalmente aparece a la noche o despus de comer. Puede durar Ardelia Mems hora o ms. Se puede extender hacia el hombro derecho, la espalda o el pecho. Puede sentirse Pharmacist, community, ardor o sensacin de plenitud en la parte superior del vientre (indigestin). Si la obstruccin dura ms de algunas horas, puede tener una infeccin o hinchazn. Usted puede: Sentir que va a vomitar. Vomitar. Sentirse hinchado. Tener dolor en el vientre durante 5 horas o ms. Sentir dolor con la palpacin en el vientre, a menudo en la parte superior derecha y debajo de las [place]. Tener fiebre o escalofros. Notar que la piel o la zona blanca de los ojos se vuelven amarillas (ictericia). Tener el pis (orina) oscuro o las deposiciones (heces) plidas. Cmo se trata? El tratamiento de esta afeccin depende de qu tan mal se sienta. Si tiene sntomas, puede necesitar lo siguiente: Multimedia programmer, si los sntomas no son Orlene Erm graves. No coma durante 12 a 24 horas. Beba solamente agua y lquidos claros. Comience a comer alimentos simples o claros despus de 1 o 2 das. Pruebe con caldos y galletas. Es posible que necesite medicamentos para el dolor o Social research officer, government, o ambos. Si tiene una infeccin, necesitar antibiticos. Hospitalizacin, si tiene un dolor muy intenso o una infeccin muy grave. Ciruga para extirpar la vescula biliar. Puede ser necesario si: Los clculos biliares siguen apareciendo. Tiene sntomas muy graves. Medicamentos para destruir los clculos biliares. Medicamentos: Son mejores para  los clculos pequeos. Pueden usarse durante hasta 6 a 12 meses. Un procedimiento para encontrar y extraer los clculos biliares o para fragmentarlos. Siga estas instrucciones en su casa: Medicamentos Use los medicamentos de venta libre y los recetados solamente como se lo haya indicado el mdico. Si le recetaron un antibitico, tmelo como se lo haya indicado el mdico. No deje de tomar el antibitico aunque comience a sentirse mejor. Pregntele al mdico si el medicamento recetado le impide conducir o usar [place]. Comida y bebida Beba suficiente lquido como para Theatre manager la orina de color amarillo plido. Beba agua o lquidos claros. Esto es importante cuando Tree surgeon. Consuma alimentos saludables. Elija: Menos alimentos grasos, como las comidas fritas. Menos carbohidratos refinados. Evite los panes y los cereales muy procesados, como el pan blanco y el arroz blanco. Elija cereales integrales, como el pan integral y Dwyane Luo integral. Ms Fredderick [place]. Las [place], las frutas frescas y los frijoles son fuentes saludables. Instrucciones generales Mantenga un peso saludable. Concurra a todas las visitas de seguimiento como se lo haya indicado el mdico. Esto es importante. Dnde buscar ms informacin [employer] of Diabetes and Digestive and Kidney Diseases ([address] Diabetes y las Enfermedades Digestivas y Renales): [email or web address] Comunquese con un mdico si: Siente dolor repentino en el costado superior derecho del vientre. El dolor podra extenderse hasta el hombro derecho, la espalda o el pecho. Le han diagnosticado clculos biliares que no presentan sntomas y tiene lo siguiente: Dolor abdominal. Molestias, ardor o sensacin de plenitud en la parte superior del abdomen. La orina es de color oscuro o tiene heces plidas. Solicite ayuda de inmediato si: Tiene dolor repentino en la parte superior derecha del abdomen y el dolor dura ms de 2 horas. Tiene  dolor en el abdomen y: Dura ms de 5 horas. Sigue empeorando. Tiene fiebre o escalofros. Tiene  ganas continuas de vomitar. Sigue vomitando. La piel y la parte blanca de los ojos se ponen amarillos. Resumen La colelitiasis ocurre cuando se forman clculos biliares en la vescula biliar. La causa de esta afeccin puede ser Ardelia Mems enfermedad de la [place], una cantidad excesiva de una sustancia parecida a la grasa en la bilis o una cantidad insuficiente de sales biliares. El tratamiento de esta afeccin depende de qu tan mal se sienta. Si tiene sntomas, no coma ni beba. Es posible que necesite tomar medicamentos. Tal vez necesite hospitalizacin si tiene un dolor muy intenso o una infeccin muy grave. Es posible que deba someterse a una ciruga si los clculos siguen apareciendo o si tiene sntomas muy graves. Esta informacin no tiene Marine scientist el consejo del mdico. Asegrese de hacerle al mdico cualquier pregunta que tenga. Document Revised: 12/21/2019 Document Reviewed: 12/21/2019 Elsevier Patient Education  2022 [place], MD [facility]

## 2021-12-09 ENCOUNTER — Ambulatory Visit
Admission: RE | Admit: 2021-12-09 | Discharge: 2021-12-09 | Disposition: A | Discharge status: Home / Self Care | Payer: No Typology Code available for payment source | Source: Ambulatory Visit | Attending: Emergency Medicine | Admitting: Emergency Medicine

## 2021-12-09 ENCOUNTER — Other Ambulatory Visit: Payer: Self-pay | Admitting: Emergency Medicine

## 2021-12-09 DIAGNOSIS — Z8719 Personal history of other diseases of the digestive system: Secondary | ICD-10-CM

## 2021-12-09 DIAGNOSIS — K8020 Calculus of gallbladder without cholecystitis without obstruction: Secondary | ICD-10-CM

## 2021-12-09 DIAGNOSIS — R1011 Right upper quadrant pain: Secondary | ICD-10-CM

## 2022-01-10 ENCOUNTER — Encounter: Payer: Self-pay | Admitting: Emergency Medicine

## 2022-01-18 ENCOUNTER — Other Ambulatory Visit: Payer: Self-pay

## 2022-01-18 DIAGNOSIS — Z1231 Encounter for screening mammogram for malignant neoplasm of breast: Secondary | ICD-10-CM

## 2022-02-10 ENCOUNTER — Ambulatory Visit: Payer: Self-pay | Admitting: General Surgery

## 2022-02-10 NOTE — H&P (Signed)
?vChief Complaint: Cholelithiasis ?  ?  ?  ?History of Present Illness: ?Teresa Michael is a 46 y.o. female who is seen today as an office consultation at the request of Dr. Kittie Plater for evaluation of Cholelithiasis ?Marland Kitchen   ?Patient is a 46 year old female, Spanish-speaking, comes in secondary to symptomatic gallstones. ?Patient states that pain began approximately November.  She states this was after high fatty meal.  She states that in the past she had had pain as well as a history of gallstones. ?  ?She states that pain usually starts after spicy, high fatty meal.  She states that she tries to avoid these foods which is helped. ?  ?Patient has had an ultrasound recently which I reviewed personally and reviewed the results with the patient.  This did show multiple layering gallstones. ?  ?Patient did have LFTs with an elevated alk phos. ?  ?Patient had previous C-section x2 in the past. ?  ?  ?  ?Review of Systems: ?A complete review of systems was obtained from the patient.  I have reviewed this information and discussed as appropriate with the patient.  See HPI as well for other ROS. ?  ?Review of Systems  ?Constitutional: Negative for fever.  ?HENT: Negative for congestion.   ?Eyes: Negative for blurred vision.  ?Respiratory: Negative for cough, shortness of breath and wheezing.   ?Cardiovascular: Negative for chest pain and palpitations.  ?Gastrointestinal: Positive for abdominal pain, nausea and vomiting. Negative for heartburn.  ?Genitourinary: Negative for dysuria.  ?Musculoskeletal: Negative for myalgias.  ?Skin: Negative for rash.  ?Neurological: Negative for dizziness and headaches.  ?Psychiatric/Behavioral: Negative for depression and suicidal ideas.  ?All other systems reviewed and are negative. ?  ?  ?  ?Medical History: ?Past Medical History ?Past Medical History: ?Diagnosis Date ? Thyroid disease   ? ?  ?  ?There is no problem list on file for this patient. ?  ?  ?Past Surgical History ?Past  Surgical History: ?Procedure Laterality Date ? CESAREAN SECTION     ? REPEAT CESAREAN SECTION     ? ?  ?  ?Allergies ?No Known Allergies ? ?  ?Current Outpatient Medications on File Prior to Visit ?Medication Sig Dispense Refill ? atorvastatin (LIPITOR) 10 MG tablet Take 1 tablet by mouth once daily     ? losartan-hydrochlorothiazide (HYZAAR) 50-12.5 mg tablet Take 1 tablet by mouth once daily     ? levothyroxine (SYNTHROID) 112 MCG tablet TAKE 1 TABLET BY MOUTH ONCE DAILY IN THE MORNING ON AN EMPTY STOMACH     ? multivitamin tablet Take 1 tablet by mouth once daily     ?  ?No current facility-administered medications on file prior to visit. ?  ?  ?Family History ?Family History ?Problem Relation Age of Onset ? High blood pressure (Hypertension) Father   ? Hyperlipidemia (Elevated cholesterol) Father   ? Diabetes Sister   ? Hyperlipidemia (Elevated cholesterol) Sister   ? Stroke Brother   ? High blood pressure (Hypertension) Brother   ? ?  ?  ?Social History ?  ?Tobacco Use ?Smoking Status Never ?Smokeless Tobacco Never ?  ?  ?Social History ?Social History ?  ? ?Socioeconomic History ? Marital status: Married ?Tobacco Use ? Smoking status: Never ? Smokeless tobacco: Never ?Substance and Sexual Activity ? Alcohol use: Yes ?    Comment: Ocassionally ? Drug use: Never ? ?  ?  ?Objective: ?  ?  ?Vitals: ?  02/10/22 1545 ?BP: 130/76 ?Pulse: 81 ?Temp: 37.1 ?  C (98.7 ?F) ?SpO2: 99% ?Weight: 60.8 kg (134 lb) ?Height: 149.9 cm ('4\' 11"' ) ?  ?Body mass index is 27.06 kg/m?. ?  ?Physical Exam ?Constitutional:   ?   Appearance: Normal appearance.  ?HENT:  ?   Head: Normocephalic and atraumatic.  ?   Mouth/Throat:  ?   Mouth: Mucous membranes are moist.  ?   Pharynx: Oropharynx is clear.  ?Eyes:  ?   General: No scleral icterus. ?   Pupils: Pupils are equal, round, and reactive to light.  ?Cardiovascular:  ?   Rate and Rhythm: Normal rate and regular rhythm.  ?   Pulses: Normal pulses.  ?   Heart sounds: No murmur heard. ?  No  friction rub. No gallop.  ?Pulmonary:  ?   Effort: Pulmonary effort is normal. No respiratory distress.  ?   Breath sounds: Normal breath sounds. No stridor.  ?Abdominal:  ?   General: Abdomen is flat.  ?Musculoskeletal:     ?   General: No swelling.  ?Skin: ?   General: Skin is warm.  ?Neurological:  ?   General: No focal deficit present.  ?   Mental Status: She is alert and oriented to person, place, and time. Mental status is at baseline.  ?Psychiatric:     ?   Mood and Affect: Mood normal.     ?   Thought Content: Thought content normal.     ?   Judgment: Judgment normal.  ?  ?  ?  ?Assessment and Plan: ?Diagnoses and all orders for this visit: ?  ?Symptomatic cholelithiasis ?  ?  ?Teresa Michael is a 46 y.o. female  ?  ?1.  We will proceed to the OR for a lap cholecystectomy. ?2. All risks and benefits were discussed with the patient to generally include: infection, bleeding, possible need for post op ERCP, damage to the bile ducts, and bile leak. Alternatives were offered and described.  All questions were answered and the patient voiced understanding of the procedure and wishes to proceed at this point with a laparoscopic cholecystectomy ?  ?  ?  ?  ?  ?No follow-ups on file. ?  ?Ralene Ok, MD, FACS ?[facility], [place] ?General & Minimally Invasive Surgery ? ?

## 2022-03-02 ENCOUNTER — Ambulatory Visit
Admission: RE | Admit: 2022-03-02 | Discharge: 2022-03-02 | Disposition: A | Discharge status: Home / Self Care | Payer: No Typology Code available for payment source | Source: Ambulatory Visit | Attending: Obstetrics and Gynecology | Admitting: Obstetrics and Gynecology

## 2022-03-02 ENCOUNTER — Ambulatory Visit: Payer: Self-pay | Admitting: *Deleted

## 2022-03-02 VITALS — BP 128/82 | Wt 132.1 lb

## 2022-03-02 DIAGNOSIS — N898 Other specified noninflammatory disorders of vagina: Secondary | ICD-10-CM

## 2022-03-02 DIAGNOSIS — Z1211 Encounter for screening for malignant neoplasm of colon: Secondary | ICD-10-CM

## 2022-03-02 DIAGNOSIS — Z1231 Encounter for screening mammogram for malignant neoplasm of breast: Secondary | ICD-10-CM

## 2022-03-02 DIAGNOSIS — Z01419 Encounter for gynecological examination (general) (routine) without abnormal findings: Secondary | ICD-10-CM

## 2022-03-02 NOTE — Progress Notes (Signed)
[name] is a 46 y.o. female who presents to [facility] clinic today with no complaints.  ?  ?Pap Smear: Pap smear completed today. Last Pap smear was 07/27/2016 at [place] [facility] and was normal with negative HPV. Per patient has no history of an abnormal Pap smear. Last Pap smear result is available in Epic. ? ?Pelvic/Bimanual ?Ext Genitalia ?No lesions, no swelling and no discharge observed on external genitalia.      ?  ?Vagina ?Vagina pink and normal texture. No lesions and thick yellowish colored discharge observed in vagina. Wet prep completed.      ?  ?Cervix ?Cervix is present. Cervix pink and of normal texture. Cervix friable. Thick yellowish colored discharge observed on cervix. ?  ?Uterus ?Uterus is present and palpable. Uterus in normal position and normal size.      ?  ?Adnexae ?Bilateral ovaries present and palpable. No tenderness on palpation.       ?  ?Rectovaginal ?No rectal exam completed today since patient had no rectal complaints. No skin abnormalities observed on exam.   ? ?Physical exam: ?Breasts ?Breasts symmetrical. No skin abnormalities bilateral breasts. No nipple retraction bilateral breasts. No nipple discharge bilateral breasts. No lymphadenopathy. No lumps palpated bilateral breasts. No complaints of pain or tenderness on exam.    ? ?MS DIGITAL SCREENING TOMO BILATERAL ? ?Result Date: 12/26/2020 ?CLINICAL DATA:  Screening. EXAM: DIGITAL SCREENING BILATERAL MAMMOGRAM WITH TOMO AND CAD COMPARISON:  Previous exam(s). ACR Breast Density Category d: The breast tissue is extremely dense, which lowers the sensitivity of mammography FINDINGS: There are no findings suspicious for malignancy. The images were evaluated with computer-aided detection. IMPRESSION: No mammographic evidence of malignancy. A result letter of this screening mammogram will be mailed directly to the patient. RECOMMENDATION: Screening mammogram in one year. (Code:SM-B-01Y) BI-RADS CATEGORY  1:  Negative. Electronically Signed   By: Kristopher Oppenheim M.D.   On: 12/26/2020 15:43  ? ?MS DIGITAL SCREENING TOMO BILATERAL ? ?Result Date: 10/17/2019 ?CLINICAL DATA:  Screening. EXAM: DIGITAL SCREENING BILATERAL MAMMOGRAM WITH TOMO AND CAD COMPARISON:  Previous exam(s). ACR Breast Density Category d: The breast tissue is extremely dense, which lowers the sensitivity of mammography FINDINGS: There are no findings suspicious for malignancy. Images were processed with CAD. IMPRESSION: No mammographic evidence of malignancy. A result letter of this screening mammogram will be mailed directly to the patient. RECOMMENDATION: Screening mammogram in one year. (Code:SM-B-01Y) BI-RADS CATEGORY  1: Negative. Electronically Signed   By: Kristopher Oppenheim M.D.   On: 10/17/2019 11:21  ? ?MS DIGITAL SCREENING TOMO BILATERAL ? ?Result Date: 10/06/2018 ?CLINICAL DATA:  Screening. EXAM: DIGITAL SCREENING BILATERAL MAMMOGRAM WITH TOMO AND CAD COMPARISON:  Previous exam(s). ACR Breast Density Category c: The breast tissue is heterogeneously dense, which may obscure small masses. FINDINGS: There are no findings suspicious for malignancy. Images were processed with CAD. IMPRESSION: No mammographic evidence of malignancy. A result letter of this screening mammogram will be mailed directly to the patient. RECOMMENDATION: Screening mammogram in one year. (Code:SM-B-01Y) BI-RADS CATEGORY  1: Negative. Electronically Signed   By: Dorise Bullion III M.D   On: 10/06/2018 12:07   ? ?Smoking History: ?Patient has never smoked. ?  ?Patient Navigation: ?Patient education provided. Access to services provided for patient through [facility] program.  ? ?Colorectal Cancer Screening: ?Per patient has never had colonoscopy completed. FIT Test given to patient to complete. No complaints today.  ?  ?Breast and Cervical Cancer Risk Assessment: ?Patient does  not have family history of breast cancer, known genetic mutations, or radiation treatment to the chest before  age 36. Patient does not have history of cervical dysplasia, immunocompromised, or DES exposure in-utero. ? ?Risk Assessment   ? ? Risk Scores   ? ?   03/02/2022 12/25/2020  ? Last edited by: Demetrius Revel, LPN McGill, Karlton Lemon, LPN  ? 5-year risk: 0.6 % 0.6 %  ? Lifetime risk: 6.8 % 6.9 %  ? ?  ?  ? ?  ? ? ?A: ?BCCCP exam without pap smear ?No complaints. ? ?P: ?Referred patient to the [place] for a screening mammogram on mobile unit. Appointment scheduled Tuesday, March 02, 2022 at 1110. ? ?Teresa Parish, RN ?03/02/2022 10:15 AM   ?

## 2022-03-02 NOTE — Patient Instructions (Signed)
Explained breast self awareness with Haroldine Laws Espitia. Pap smear completed today. Let her know B[facility]CP will cover Pap smears and HPV typing every 5 years unless has a history of abnormal Pap smears. Referred patient to the [facility] for a screening mammogram on mobile unit. Appointment scheduled Tuesday, March 02, 2022 at 1110. Patient aware of appointment and will be there. Let patient know will follow up with her within the next week with results of her wet prep and Pap smear by phone. Informed patient that the Breast Center will follow up with her within the next couple of weeks with results of her mammogram by letter or phone. Haroldine Laws Espitia verbalized understanding. ? ?Selestino Nila, Kathaleen Maser, RN ?10:15 AM ? ? ? ? ?

## 2022-03-03 LAB — CYTOLOGY - PAP
Comment: NEGATIVE
Diagnosis: NEGATIVE
High risk HPV: NEGATIVE

## 2022-03-04 ENCOUNTER — Telehealth: Payer: Self-pay

## 2022-03-04 LAB — CERVICOVAGINAL ANCILLARY ONLY
Bacterial Vaginitis (gardnerella): NEGATIVE
Candida Glabrata: NEGATIVE
Candida Vaginitis: NEGATIVE
Chlamydia: NEGATIVE
Comment: NEGATIVE
Comment: NEGATIVE
Comment: NEGATIVE
Comment: NEGATIVE
Comment: NEGATIVE
Comment: NORMAL
Neisseria Gonorrhea: NEGATIVE
Trichomonas: NEGATIVE

## 2022-03-04 NOTE — Telephone Encounter (Signed)
Via Rudene Anda, Spanish Interpreter [facility]), Patient informed negative Pap/HPV and wet prep results, next pap smear due in 5 years. Patient verbalized understanding.  ?

## 2022-03-19 ENCOUNTER — Other Ambulatory Visit: Payer: Self-pay

## 2022-03-19 ENCOUNTER — Encounter (HOSPITAL_COMMUNITY): Payer: Self-pay | Admitting: General Surgery

## 2022-03-19 NOTE — Progress Notes (Signed)
Spoke with pt for pre-op call via [employer] (ID # [phone] - Spanish). Pt denies cardiac history. Pt is treated for HTN and Hypothyroidism.  ? ?Shower instructions given to pt.  ?

## 2022-03-19 NOTE — H&P (Signed)
?Chief Complaint: Cholelithiasis ?  ?  ?  ?History of Present Illness: ?Teresa Michael is a 46 y.o. female who is seen today as an office consultation at the request of Dr. Kittie Plater for evaluation of Cholelithiasis ?Marland Kitchen   ?Patient is a 46 year old female, Spanish-speaking, comes in secondary to symptomatic gallstones. ?Patient states that pain began approximately November.  She states this was after high fatty meal.  She states that in the past she had had pain as well as a history of gallstones. ?  ?She states that pain usually starts after spicy, high fatty meal.  She states that she tries to avoid these foods which is helped. ?  ?Patient has had an ultrasound recently which I reviewed personally and reviewed the results with the patient.  This did show multiple layering gallstones. ?  ?Patient did have LFTs with an elevated alk phos. ?  ?Patient had previous C-section x2 in the past. ?  ?  ?  ?Review of Systems: ?A complete review of systems was obtained from the patient.  I have reviewed this information and discussed as appropriate with the patient.  See HPI as well for other ROS. ?  ?Review of Systems  ?Constitutional: Negative for fever.  ?HENT: Negative for congestion.   ?Eyes: Negative for blurred vision.  ?Respiratory: Negative for cough, shortness of breath and wheezing.   ?Cardiovascular: Negative for chest pain and palpitations.  ?Gastrointestinal: Positive for abdominal pain, nausea and vomiting. Negative for heartburn.  ?Genitourinary: Negative for dysuria.  ?Musculoskeletal: Negative for myalgias.  ?Skin: Negative for rash.  ?Neurological: Negative for dizziness and headaches.  ?Psychiatric/Behavioral: Negative for depression and suicidal ideas.  ?All other systems reviewed and are negative. ?  ?  ?  ?Medical History: ?Past Medical History ?Past Medical History: ?Diagnosis        Date ?           Thyroid disease            ?  ?  ?  ?There is no problem list on file for this patient. ?  ?  ?Past  Surgical History ?Past Surgical History: ?Procedure       Laterality         Date ?           CESAREAN SECTION                          ?           REPEAT CESAREAN SECTION                      ?  ?  ?  ?Allergies ?No Known Allergies ?  ?  ?Current Outpatient Medications on File Prior to Visit ?Medication       Sig       Dispense         Refill ?           atorvastatin (LIPITOR) 10 MG tablet  Take 1 tablet by mouth once daily                     ?           losartan-hydrochlorothiazide (HYZAAR) 50-12.5 mg tablet   Take 1 tablet by mouth once daily                       ?  levothyroxine (SYNTHROID) 112 MCG tablet           TAKE 1 TABLET BY MOUTH ONCE DAILY IN THE MORNING ON AN EMPTY STOMACH                       ?           multivitamin tablet       Take 1 tablet by mouth once daily                     ?  ?No current facility-administered medications on file prior to visit. ?  ?  ?Family History ?Family History ?Problem           Relation           Age of Onset ?           High blood pressure (Hypertension)   Father    ?           Hyperlipidemia (Elevated cholesterol)            Father    ?           Diabetes          Sister     ?           Hyperlipidemia (Elevated cholesterol)            Sister     ?           Stroke  Brother              ?           High blood pressure (Hypertension)   Brother              ?  ?  ?  ?Social History ?  ?Tobacco Use ?Smoking Status           Never ?Smokeless Tobacco   Never ?  ?  ?Social History ?Social History ?  ?  ?Socioeconomic History ?           Marital status:  Married ?Tobacco Use ?           Smoking status:          Never ?           Smokeless tobacco:    Never ?Substance and Sexual Activity ?           Alcohol use:    Yes ?                          Comment: Ocassionally ?           Drug use:        Never ?  ?  ?  ?Objective: ?  ?  ?Vitals: ?             02/10/22 1545 ?BP:      130/76 ?Pulse:  81 ?Temp:  37.1 ?C (98.7 ?F) ?SpO2:  99% ?Weight:            60.8 kg (134  lb) ?Height: 149.9 cm (4' 11" ) ?  ?Body mass index is 27.06 kg/m?. ?  ?Physical Exam ?Constitutional:   ?   Appearance: Normal appearance.  ?HENT:  ?   Head: Normocephalic and atraumatic.  ?   Mouth/Throat:  ?   Mouth: Mucous membranes are moist.  ?   Pharynx: Oropharynx is clear.  ?Eyes:  ?  General: No scleral icterus. ?   Pupils: Pupils are equal, round, and reactive to light.  ?Cardiovascular:  ?   Rate and Rhythm: Normal rate and regular rhythm.  ?   Pulses: Normal pulses.  ?   Heart sounds: No murmur heard. ?  No friction rub. No gallop.  ?Pulmonary:  ?   Effort: Pulmonary effort is normal. No respiratory distress.  ?   Breath sounds: Normal breath sounds. No stridor.  ?Abdominal:  ?   General: Abdomen is flat.  ?Musculoskeletal:     ?   General: No swelling.  ?Skin: ?   General: Skin is warm.  ?Neurological:  ?   General: No focal deficit present.  ?   Mental Status: She is alert and oriented to person, place, and time. Mental status is at baseline.  ?Psychiatric:     ?   Mood and Affect: Mood normal.     ?   Thought Content: Thought content normal.     ?   Judgment: Judgment normal.  ?  ?  ?  ?Assessment and Plan: ?Diagnoses and all orders for this visit: ?  ?Symptomatic cholelithiasis ?  ?  ?Teresa Michael is a 46 y.o. female  ?  ?1.          We will proceed to the OR for a lap cholecystectomy. ?2.         All risks and benefits were discussed with the patient to generally include: infection, bleeding, possible need for post op ERCP, damage to the bile ducts, and bile leak. Alternatives were offered and described.  All questions were answered and the patient voiced understanding of the procedure and wishes to proceed at this point with a laparoscopic cholecystectomy ?  ?  ?  ?  ?  ?No follow-ups on file. ?  ?Ralene Ok, MD, FACS ?[facility], [place] ?General & Minimally Invasive Surgery ?

## 2022-03-22 ENCOUNTER — Ambulatory Visit (HOSPITAL_BASED_OUTPATIENT_CLINIC_OR_DEPARTMENT_OTHER): Payer: No Typology Code available for payment source | Admitting: Anesthesiology

## 2022-03-22 ENCOUNTER — Ambulatory Visit (HOSPITAL_COMMUNITY)
Admission: RE | Admit: 2022-03-22 | Discharge: 2022-03-22 | Disposition: A | Discharge status: Home / Self Care | Payer: No Typology Code available for payment source | Attending: General Surgery | Admitting: General Surgery

## 2022-03-22 ENCOUNTER — Other Ambulatory Visit: Payer: Self-pay

## 2022-03-22 ENCOUNTER — Ambulatory Visit (HOSPITAL_COMMUNITY): Payer: No Typology Code available for payment source | Admitting: Anesthesiology

## 2022-03-22 ENCOUNTER — Encounter (HOSPITAL_COMMUNITY): Payer: Self-pay | Admitting: General Surgery

## 2022-03-22 ENCOUNTER — Encounter (HOSPITAL_COMMUNITY)
Admission: RE | Disposition: A | Discharge status: Home / Self Care | Payer: Self-pay | Source: Home / Self Care | Attending: General Surgery

## 2022-03-22 DIAGNOSIS — F419 Anxiety disorder, unspecified: Secondary | ICD-10-CM | POA: Insufficient documentation

## 2022-03-22 DIAGNOSIS — E039 Hypothyroidism, unspecified: Secondary | ICD-10-CM

## 2022-03-22 DIAGNOSIS — I10 Essential (primary) hypertension: Secondary | ICD-10-CM | POA: Insufficient documentation

## 2022-03-22 DIAGNOSIS — K8010 Calculus of gallbladder with chronic cholecystitis without obstruction: Secondary | ICD-10-CM | POA: Insufficient documentation

## 2022-03-22 DIAGNOSIS — D649 Anemia, unspecified: Secondary | ICD-10-CM | POA: Insufficient documentation

## 2022-03-22 DIAGNOSIS — K8020 Calculus of gallbladder without cholecystitis without obstruction: Secondary | ICD-10-CM

## 2022-03-22 DIAGNOSIS — R748 Abnormal levels of other serum enzymes: Secondary | ICD-10-CM | POA: Insufficient documentation

## 2022-03-22 DIAGNOSIS — D759 Disease of blood and blood-forming organs, unspecified: Secondary | ICD-10-CM | POA: Insufficient documentation

## 2022-03-22 DIAGNOSIS — Z98891 History of uterine scar from previous surgery: Secondary | ICD-10-CM | POA: Insufficient documentation

## 2022-03-22 HISTORY — DX: Other specified postprocedural states: Z98.890

## 2022-03-22 HISTORY — DX: Hypothyroidism, unspecified: E03.9

## 2022-03-22 HISTORY — DX: Anemia, unspecified: D64.9

## 2022-03-22 HISTORY — DX: COVID-19: U07.1

## 2022-03-22 HISTORY — DX: Anxiety disorder, unspecified: F41.9

## 2022-03-22 HISTORY — PX: CHOLECYSTECTOMY: SHX55

## 2022-03-22 HISTORY — DX: Family history of other specified conditions: Z84.89

## 2022-03-22 HISTORY — DX: Nausea with vomiting, unspecified: R11.2

## 2022-03-22 LAB — CBC
HCT: 41 % (ref 36.0–46.0)
Hemoglobin: 13.3 g/dL (ref 12.0–15.0)
MCH: 26.4 pg (ref 26.0–34.0)
MCHC: 32.4 g/dL (ref 30.0–36.0)
MCV: 81.3 fL (ref 80.0–100.0)
Platelets: 342 10*3/uL (ref 150–400)
RBC: 5.04 MIL/uL (ref 3.87–5.11)
RDW: 13.4 % (ref 11.5–15.5)
WBC: 7.4 10*3/uL (ref 4.0–10.5)
nRBC: 0 % (ref 0.0–0.2)

## 2022-03-22 LAB — BASIC METABOLIC PANEL
Anion gap: 8 (ref 5–15)
BUN: 16 mg/dL (ref 6–20)
CO2: 27 mmol/L (ref 22–32)
Calcium: 8.9 mg/dL (ref 8.9–10.3)
Chloride: 104 mmol/L (ref 98–111)
Creatinine, Ser: 0.94 mg/dL (ref 0.44–1.00)
GFR, Estimated: >60 mL/min (ref >60)
Glucose, Bld: 115 mg/dL — ABNORMAL HIGH (ref 70–99)
Potassium: 3.6 mmol/L (ref 3.5–5.1)
Sodium: 139 mmol/L (ref 135–145)

## 2022-03-22 LAB — POCT PREGNANCY, URINE: Preg Test, Ur: NEGATIVE

## 2022-03-22 SURGERY — LAPAROSCOPIC CHOLECYSTECTOMY
Anesthesia: Monitor Anesthesia Care | Site: Abdomen

## 2022-03-22 MED ORDER — FENTANYL CITRATE (PF) 250 MCG/5ML IJ SOLN
INTRAMUSCULAR | Status: DC | PRN
Start: 2022-03-22 — End: 2022-03-22
  Administered 2022-03-22: 100 ug via INTRAVENOUS
  Administered 2022-03-22: 50 ug via INTRAVENOUS

## 2022-03-22 MED ORDER — CEFAZOLIN SODIUM-DEXTROSE 2-4 GM/100ML-% IV SOLN
INTRAVENOUS | Status: AC
  Filled 2022-03-22: qty 100

## 2022-03-22 MED ORDER — DEXAMETHASONE SODIUM PHOSPHATE 10 MG/ML IJ SOLN
INTRAMUSCULAR | Status: DC | PRN
  Administered 2022-03-22: 10 mg via INTRAVENOUS

## 2022-03-22 MED ORDER — MIDAZOLAM HCL 2 MG/2ML IJ SOLN
INTRAMUSCULAR | Status: DC | PRN
Start: 2022-03-22 — End: 2022-03-22
  Administered 2022-03-22: 2 mg via INTRAVENOUS

## 2022-03-22 MED ORDER — PROPOFOL 10 MG/ML IV BOLUS
INTRAVENOUS | Status: AC
  Filled 2022-03-22: qty 20

## 2022-03-22 MED ORDER — ONDANSETRON HCL 4 MG/2ML IJ SOLN
INTRAMUSCULAR | Status: DC | PRN
  Administered 2022-03-22: 4 mg via INTRAVENOUS

## 2022-03-22 MED ORDER — PHENYLEPHRINE 80 MCG/ML (10ML) SYRINGE FOR IV PUSH (FOR BLOOD PRESSURE SUPPORT)
PREFILLED_SYRINGE | INTRAVENOUS | Status: DC | PRN
  Administered 2022-03-22: 40 ug via INTRAVENOUS

## 2022-03-22 MED ORDER — TRAMADOL HCL 50 MG PO TABS: 50.0000 mg | ORAL_TABLET | Freq: Four times a day (QID) | ORAL | 0 refills | Status: DC | PRN

## 2022-03-22 MED ORDER — ENSURE PRE-SURGERY PO LIQD: 296.0000 mL | Freq: Once | ORAL | Status: DC

## 2022-03-22 MED ORDER — ROCURONIUM BROMIDE 10 MG/ML (PF) SYRINGE
PREFILLED_SYRINGE | INTRAVENOUS | Status: DC | PRN
  Administered 2022-03-22: 60 mg via INTRAVENOUS

## 2022-03-22 MED ORDER — OXYCODONE HCL 5 MG PO TABS: 5.0000 mg | ORAL_TABLET | Freq: Once | ORAL | Status: AC | PRN

## 2022-03-22 MED ORDER — PROMETHAZINE HCL 25 MG/ML IJ SOLN: 6.2500 mg | INTRAMUSCULAR | Status: DC | PRN

## 2022-03-22 MED ORDER — PROPOFOL 500 MG/50ML IV EMUL
INTRAVENOUS | Status: DC | PRN
  Administered 2022-03-22: 20 ug/kg/min via INTRAVENOUS

## 2022-03-22 MED ORDER — FENTANYL CITRATE (PF) 250 MCG/5ML IJ SOLN
INTRAMUSCULAR | Status: AC
  Filled 2022-03-22: qty 5

## 2022-03-22 MED ORDER — SODIUM CHLORIDE 0.9 % IR SOLN
Status: DC | PRN
  Administered 2022-03-22: 1000 mL

## 2022-03-22 MED ORDER — PROPOFOL 10 MG/ML IV BOLUS
INTRAVENOUS | Status: DC | PRN
  Administered 2022-03-22: 150 mg via INTRAVENOUS

## 2022-03-22 MED ORDER — CHLORHEXIDINE GLUCONATE 0.12 % MT SOLN
OROMUCOSAL | Status: AC
  Administered 2022-03-22: 15 mL via OROMUCOSAL
  Filled 2022-03-22: qty 15

## 2022-03-22 MED ORDER — AMISULPRIDE (ANTIEMETIC) 5 MG/2ML IV SOLN
INTRAVENOUS | Status: AC
  Filled 2022-03-22: qty 4

## 2022-03-22 MED ORDER — CHLORHEXIDINE GLUCONATE CLOTH 2 % EX PADS: 6.0000 | MEDICATED_PAD | Freq: Once | CUTANEOUS | Status: DC

## 2022-03-22 MED ORDER — ACETAMINOPHEN 500 MG PO TABS: 1000.0000 mg | ORAL_TABLET | ORAL | Status: AC

## 2022-03-22 MED ORDER — MEPERIDINE HCL 25 MG/ML IJ SOLN: 6.2500 mg | INTRAMUSCULAR | Status: DC | PRN

## 2022-03-22 MED ORDER — CHLORHEXIDINE GLUCONATE 0.12 % MT SOLN: 15.0000 mL | Freq: Once | OROMUCOSAL | Status: AC

## 2022-03-22 MED ORDER — LIDOCAINE 2% (20 MG/ML) 5 ML SYRINGE
INTRAMUSCULAR | Status: DC | PRN
  Administered 2022-03-22: 60 mg via INTRAVENOUS

## 2022-03-22 MED ORDER — ORAL CARE MOUTH RINSE: 15.0000 mL | Freq: Once | OROMUCOSAL | Status: AC

## 2022-03-22 MED ORDER — BUPIVACAINE HCL (PF) 0.25 % IJ SOLN
INTRAMUSCULAR | Status: AC
  Filled 2022-03-22: qty 30

## 2022-03-22 MED ORDER — SUGAMMADEX SODIUM 200 MG/2ML IV SOLN
INTRAVENOUS | Status: DC | PRN
  Administered 2022-03-22: 200 mg via INTRAVENOUS

## 2022-03-22 MED ORDER — PHENYLEPHRINE HCL-NACL 20-0.9 MG/250ML-% IV SOLN
INTRAVENOUS | Status: DC | PRN
  Administered 2022-03-22: 20 ug/min via INTRAVENOUS

## 2022-03-22 MED ORDER — OXYCODONE HCL 5 MG/5ML PO SOLN
5.0000 mg | Freq: Once | ORAL | Status: AC | PRN
  Administered 2022-03-22: 5 mg via ORAL

## 2022-03-22 MED ORDER — LACTATED RINGERS IV SOLN: INTRAVENOUS | Status: DC

## 2022-03-22 MED ORDER — CEFAZOLIN SODIUM-DEXTROSE 2-4 GM/100ML-% IV SOLN
2.0000 g | INTRAVENOUS | Status: AC
  Administered 2022-03-22: 2 g via INTRAVENOUS

## 2022-03-22 MED ORDER — HYDROMORPHONE HCL 1 MG/ML IJ SOLN
INTRAMUSCULAR | Status: AC
  Filled 2022-03-22: qty 1

## 2022-03-22 MED ORDER — HYDROMORPHONE HCL 1 MG/ML IJ SOLN
0.2500 mg | INTRAMUSCULAR | Status: DC | PRN
  Administered 2022-03-22: 0.5 mg via INTRAVENOUS

## 2022-03-22 MED ORDER — BUPIVACAINE HCL 0.25 % IJ SOLN
INTRAMUSCULAR | Status: DC | PRN
  Administered 2022-03-22: 13 mL
  Administered 2022-03-22: 17 mL

## 2022-03-22 MED ORDER — OXYCODONE HCL 5 MG/5ML PO SOLN
ORAL | Status: DC
Start: 2022-03-22 — End: 2022-03-22
  Filled 2022-03-22: qty 5

## 2022-03-22 MED ORDER — AMISULPRIDE (ANTIEMETIC) 5 MG/2ML IV SOLN
10.0000 mg | Freq: Once | INTRAVENOUS | Status: AC | PRN
  Administered 2022-03-22: 10 mg via INTRAVENOUS

## 2022-03-22 MED ORDER — MIDAZOLAM HCL 2 MG/2ML IJ SOLN
INTRAMUSCULAR | Status: AC
  Filled 2022-03-22: qty 2

## 2022-03-22 MED ORDER — ACETAMINOPHEN 500 MG PO TABS
ORAL_TABLET | ORAL | Status: AC
  Administered 2022-03-22: 1000 mg via ORAL
  Filled 2022-03-22: qty 2

## 2022-03-22 MED ORDER — 0.9 % SODIUM CHLORIDE (POUR BTL) OPTIME
TOPICAL | Status: DC | PRN
Start: 2022-03-22 — End: 2022-03-22
  Administered 2022-03-22: 1000 mL

## 2022-03-22 SURGICAL SUPPLY — 41 items
BAG COUNTER SPONGE SURGICOUNT (BAG) ×2 IMPLANT
CANISTER SUCT 3000ML PPV (MISCELLANEOUS) ×2 IMPLANT
CHLORAPREP W/TINT 26 (MISCELLANEOUS) ×2 IMPLANT
CLIP LIGATING HEMO O LOK GREEN (MISCELLANEOUS) ×2 IMPLANT
COVER SURGICAL LIGHT HANDLE (MISCELLANEOUS) ×2 IMPLANT
COVER TRANSDUCER ULTRASND (DRAPES) ×2 IMPLANT
DERMABOND ADVANCED (GAUZE/BANDAGES/DRESSINGS) ×1
DERMABOND ADVANCED .7 DNX12 (GAUZE/BANDAGES/DRESSINGS) ×1 IMPLANT
ELECT REM PT RETURN 9FT ADLT (ELECTROSURGICAL) ×2
ELECTRODE REM PT RTRN 9FT ADLT (ELECTROSURGICAL) ×1 IMPLANT
GLOVE BIO SURGEON STRL SZ7.5 (GLOVE) ×2 IMPLANT
GLOVE SURG SYN 7.5  E (GLOVE) ×2
GLOVE SURG SYN 7.5 E (GLOVE) ×1 IMPLANT
GLOVE SURG SYN 7.5 PF PI (GLOVE) ×1 IMPLANT
GOWN STRL REUS W/ TWL LRG LVL3 (GOWN DISPOSABLE) ×2 IMPLANT
GOWN STRL REUS W/ TWL XL LVL3 (GOWN DISPOSABLE) ×1 IMPLANT
GOWN STRL REUS W/TWL LRG LVL3 (GOWN DISPOSABLE) ×4
GOWN STRL REUS W/TWL XL LVL3 (GOWN DISPOSABLE) ×2
GRASPER SUT TROCAR 14GX15 (MISCELLANEOUS) ×2 IMPLANT
KIT BASIN OR (CUSTOM PROCEDURE TRAY) ×2 IMPLANT
KIT TURNOVER KIT B (KITS) ×2 IMPLANT
NDL INSUFFLATION 14GA 120MM (NEEDLE) ×1 IMPLANT
NEEDLE INSUFFLATION 14GA 120MM (NEEDLE) ×2 IMPLANT
NS IRRIG 1000ML POUR BTL (IV SOLUTION) ×2 IMPLANT
PAD ARMBOARD 7.5X6 YLW CONV (MISCELLANEOUS) ×2 IMPLANT
POUCH LAPAROSCOPIC INSTRUMENT (MISCELLANEOUS) ×2 IMPLANT
POUCH RETRIEVAL ECOSAC 10 (ENDOMECHANICALS) IMPLANT
POUCH RETRIEVAL ECOSAC 10MM (ENDOMECHANICALS) ×2
SCISSORS LAP 5X35 DISP (ENDOMECHANICALS) ×2 IMPLANT
SET IRRIG TUBING LAPAROSCOPIC (IRRIGATION / IRRIGATOR) ×2 IMPLANT
SET TUBE SMOKE EVAC HIGH FLOW (TUBING) ×2 IMPLANT
SLEEVE ENDOPATH XCEL 5M (ENDOMECHANICALS) ×2 IMPLANT
SPECIMEN JAR SMALL (MISCELLANEOUS) ×2 IMPLANT
SUT MNCRL AB 4-0 PS2 18 (SUTURE) ×2 IMPLANT
TOWEL GREEN STERILE (TOWEL DISPOSABLE) ×2 IMPLANT
TOWEL GREEN STERILE FF (TOWEL DISPOSABLE) ×2 IMPLANT
TRAY LAPAROSCOPIC MC (CUSTOM PROCEDURE TRAY) ×2 IMPLANT
TROCAR XCEL NON-BLD 11X100MML (ENDOMECHANICALS) ×2 IMPLANT
TROCAR XCEL NON-BLD 5MMX100MML (ENDOMECHANICALS) ×2 IMPLANT
WARMER LAPAROSCOPE (MISCELLANEOUS) ×2 IMPLANT
WATER STERILE IRR 1000ML POUR (IV SOLUTION) ×2 IMPLANT

## 2022-03-22 NOTE — Anesthesia Procedure Notes (Signed)
Procedure Name: Intubation ?Date/Time: 03/22/2022 7:30 AM ?Performed by: Vonna Drafts, CRNA ?Pre-anesthesia Checklist: Patient identified, Emergency Drugs available, Suction available and Patient being monitored ?Patient Re-evaluated:Patient Re-evaluated prior to induction ?Oxygen Delivery Method: Circle system utilized ?Preoxygenation: Pre-oxygenation with 100% oxygen ?Induction Type: IV induction ?Ventilation: Mask ventilation without difficulty and Oral airway inserted - appropriate to patient size ?Laryngoscope Size: Mac and 4 ?Grade View: Grade I ?Tube type: Oral ?Tube size: 7.0 mm ?Number of attempts: 1 ?Airway Equipment and Method: Stylet and Oral airway ?Placement Confirmation: ETT inserted through vocal cords under direct vision, positive ETCO2 and breath sounds checked- equal and bilateral ?Secured at: 21 cm ?Tube secured with: Tape ?Dental Injury: Teeth and Oropharynx as per pre-operative assessment  ? ? ? ? ?

## 2022-03-22 NOTE — Discharge Instructions (Addendum)
[facility] ______[facility] LAPAROSCOPIC SURGERY: POST OP INSTRUCTIONS Always review your discharge instruction sheet given to you by the facility where your surgery was performed. IF YOU HAVE DISABILITY OR FAMILY LEAVE FORMS, YOU MUST BRING THEM TO THE OFFICE FOR PROCESSING.   DO NOT GIVE THEM TO YOUR DOCTOR.  A prescription for pain medication may be given to you upon discharge.  Take your pain medication as prescribed, if needed.  If narcotic pain medicine is not needed, then you may take acetaminophen (Tylenol) or ibuprofen (Advil) as needed. Take your usually prescribed medications unless otherwise directed. If you need a refill on your pain medication, please contact your pharmacy.  They will contact our office to request authorization. Prescriptions will not be filled after 5pm or on week-ends. You should follow a light diet the first few days after arrival home, such as soup and crackers, etc.  Be sure to include lots of fluids daily. Most patients will experience some swelling and bruising in the area of the incisions.  Ice packs will help.  Swelling and bruising can take several days to resolve.  It is common to experience some constipation if taking pain medication after surgery.  Increasing fluid intake and taking a stool softener (such as Colace) will usually help or prevent this problem from occurring.  A mild laxative (Milk of Magnesia or Miralax) should be taken according to package instructions if there are no bowel movements after 48 hours. Unless discharge instructions indicate otherwise, you may remove your bandages 24-48 hours after surgery, and you may shower at that time.  You may have steri-strips (small skin tapes) in place directly over the incision.  These strips should be left on the skin for 7-10 days.  If your surgeon used skin glue on the incision, you may shower in 24 hours.  The glue will flake off over the next 2-3 weeks.  Any sutures or staples will be  removed at the office during your follow-up visit. ACTIVITIES:  You may resume regular (light) daily activities beginning the next day--such as daily self-care, walking, climbing stairs--gradually increasing activities as tolerated.  You may have sexual intercourse when it is comfortable.  Refrain from any heavy lifting or straining until approved by your doctor. You may drive when you are no longer taking prescription pain medication, you can comfortably wear a seatbelt, and you can safely maneuver your car and apply brakes. RETURN TO WORK:  __________________________________________________________ You should see your doctor in the office for a follow-up appointment approximately 2-3 weeks after your surgery.  Make sure that you call for this appointment within a day or two after you arrive home to insure a convenient appointment time. OTHER INSTRUCTIONS: __________________________________________________________________________________________________________________________ __________________________________________________________________________________________________________________________ WHEN TO CALL YOUR DOCTOR: Fever over 101.0 Inability to urinate Continued bleeding from incision. Increased pain, redness, or drainage from the incision. Increasing abdominal pain  The clinic staff is available to answer your questions during regular business hours.  Please don't hesitate to call and ask to speak to one of the nurses for clinical concerns.  If you have a medical emergency, go to the nearest emergency room or call 911.  A surgeon from [facility] is always on call at the hospital. [address], [address], [place], [place]  [address] ? [address] [address], [place], [place]   [address] [phone] ? [phone] ? FAX [phone] Web site: [email or web address]  

## 2022-03-22 NOTE — Interval H&P Note (Signed)
History and Physical Interval Note: ? ?03/22/2022 ?7:16 AM ? ?Teresa Michael  has presented today for surgery, with the diagnosis of GALLSTONES.  The various methods of treatment have been discussed with the patient and family. After consideration of risks, benefits and other options for treatment, the patient has consented to  Procedure(s): ?LAPAROSCOPIC CHOLECYSTECTOMY (N/A) as a surgical intervention.  The patient's history has been reviewed, patient examined, no change in status, stable for surgery.  I have reviewed the patient's chart and labs.  Questions were answered to the patient's satisfaction.   ? ? ?Axel Filler ? ? ?

## 2022-03-22 NOTE — Anesthesia Preprocedure Evaluation (Signed)
Anesthesia Evaluation  ?Patient identified by MRN, date of birth, ID band ?Patient awake ? ? ? ?Reviewed: ?Allergy & Precautions, NPO status , Patient's Chart, lab work & pertinent test results ? ?History of Anesthesia Complications ?(+) PONV and history of anesthetic complications ? ?Airway ?Mallampati: II ? ?TM Distance: >3 FB ?Neck ROM: Full ? ? ? Dental ?no notable dental hx. ? ?  ?Pulmonary ?neg pulmonary ROS,  ?  ?Pulmonary exam normal ?breath sounds clear to auscultation ? ? ? ? ? ? Cardiovascular ?hypertension, Pt. on medications ?negative cardio ROS ?Normal cardiovascular exam ?Rhythm:Regular Rate:Normal ? ? ?  ?Neuro/Psych ?Anxiety negative neurological ROS ? negative psych ROS  ? GI/Hepatic ?negative GI ROS, Neg liver ROS,   ?Endo/Other  ?Hypothyroidism  ? Renal/GU ?negative Renal ROS  ?negative genitourinary ?  ?Musculoskeletal ?negative musculoskeletal ROS ?(+)  ? Abdominal ?  ?Peds ?negative pediatric ROS ?(+)  Hematology ? ?(+) Blood dyscrasia, anemia ,   ?Anesthesia Other Findings ? ? Reproductive/Obstetrics ?negative OB ROS ? ?  ? ? ? ? ? ? ? ? ? ? ? ? ? ?  ?  ? ? ? ? ? ? ? ? ?Anesthesia Physical ?Anesthesia Plan ? ?ASA: 2 ? ?Anesthesia Plan: MAC  ? ?Post-op Pain Management:   ? ?Induction: Intravenous ? ?PONV Risk Score and Plan: 3 and Ondansetron, Dexamethasone, Midazolam and Treatment may vary due to age or medical condition ? ?Airway Management Planned: Natural Airway ? ?Additional Equipment:  ? ?Intra-op Plan:  ? ?Post-operative Plan:  ? ?Informed Consent: I have reviewed the patients History and Physical, chart, labs and discussed the procedure including the risks, benefits and alternatives for the proposed anesthesia with the patient or authorized representative who has indicated his/her understanding and acceptance.  ? ? ? ?Dental advisory given ? ?Plan Discussed with: CRNA ? ?Anesthesia Plan Comments:   ? ? ? ? ? ? ?Anesthesia Quick Evaluation ? ?

## 2022-03-22 NOTE — Anesthesia Postprocedure Evaluation (Signed)
Anesthesia Post Note ? ?Patient: Jacora Hopkins Espitia ? ?Procedure(s) Performed: LAPAROSCOPIC CHOLECYSTECTOMY (Abdomen) ? ?  ? ?Patient location during evaluation: PACU ?Anesthesia Type: MAC ?Level of consciousness: awake and alert ?Pain management: pain level controlled ?Vital Signs Assessment: post-procedure vital signs reviewed and stable ?Respiratory status: spontaneous breathing, nonlabored ventilation and respiratory function stable ?Cardiovascular status: blood pressure returned to baseline and stable ?Postop Assessment: no apparent nausea or vomiting ?Anesthetic complications: no ? ? ?No notable events documented. ? ?Last Vitals:  ?Vitals:  ? 03/22/22 0900 03/22/22 0915  ?BP: (!) 157/89 134/87  ?Pulse: 78 70  ?Resp: 14 16  ?Temp:  36.8 ?C  ?SpO2: 100% 100%  ?  ?Last Pain:  ?Vitals:  ? 03/22/22 0915  ?TempSrc:   ?PainSc: 4   ? ? ?  ?  ?  ?  ?  ?  ? ?Lynda Rainwater ? ? ? ? ?

## 2022-03-22 NOTE — Transfer of Care (Signed)
Immediate Anesthesia Transfer of Care Note ? ?Patient: Teresa Michael ? ?Procedure(s) Performed: LAPAROSCOPIC CHOLECYSTECTOMY (Abdomen) ? ?Patient Location: PACU ? ?Anesthesia Type:General ? ?Level of Consciousness: drowsy ? ?Airway & Oxygen Therapy: Patient Spontanous Breathing ? ?Post-op Assessment: Report given to RN and Post -op Vital signs reviewed and stable ? ?Post vital signs: Reviewed and stable ? ?Last Vitals:  ?Vitals Value Taken Time  ?BP 138/81 03/22/22 0836  ?Temp    ?Pulse 91 03/22/22 0836  ?Resp 16 03/22/22 0836  ?SpO2 99 % 03/22/22 0836  ?Vitals shown include unvalidated device data. ? ?Last Pain:  ?Vitals:  ? 03/22/22 0637  ?TempSrc:   ?PainSc: 0-No pain  ?   ? ?  ? ?Complications: No notable events documented. ?

## 2022-03-22 NOTE — Op Note (Signed)
Pre Operative Diagnosis:  symptomatic cholelithiasis ? ?Post Operative Diagnosis:  symptomatic cholelithiasis ? ?Procedure: laparoscopic cholecystectomy ? ?Surgeon: Dr. Ralene Ok ? ?Assistant: Cameron Sprang, MD ? ?Anesthesia: General ? ?EBL: 10 cc ? ?Complications: none ? ?Counts: reported as correct x 2 ? ?Findings:  gallbladder with signs of prior inflammation and gallstones ? ?Indications for procedure:  Patient is a 46 year old female, Spanish-speaking, comes in secondary to symptomatic gallstones. Patient states that pain began approximately November.  She states this was after high fatty meal.  She states that in the past she had had pain as well as a history of gallstones. She states that pain usually starts after spicy, high fatty meal.  She states that she tries to avoid these foods which is helped. Patient has had an ultrasound recently which I reviewed personally and reviewed the results with the patient. This did show multiple layering gallstones. Patient did have LFTs with an elevated alk phos ? ?Details of the procedure: ?The patient was taken to the operating and placed in the supine position with bilateral SCDs in place.  The patient was prepped and draped in the usual sterile fashion. A time out was called and all facts were verified. A pneumoperitoneum was obtained via A Veress needle technique to a pressure of 51m of mercury. ? ?A 535mtrochar was then placed in the right upper quadrant under visualization, and there were no injuries to any abdominal organs. A 11 mm port was then placed in the umbilical region after infiltrating with local anesthesia under direct visualization. A second and third epigastric and right lateral port were placed under direct visualization.   ? ?The gallbladder was identified and retracted, the peritoneum was then sharply dissected from the gallbladder and this dissection was carried down to Calot's triangle. The cystic duct was identified and stripped away  circumferentially and seen going into the gallbladder 360?. Marland Kitchenhe cystic artery was identified and transsected with cautery. We returned our attention to the cystic duct where  2 clips were placed proximally one distally and the cystic duct transected.  We then proceeded to remove the gallbladder off the hepatic fossa with cautery. A retrieval bag was then placed in the abdomen and gallbladder placed in the bag. The hepatic fossa was then reexamined and hemostasis was achieved with Bovie cautery and was excellent at the end of the case.  ? ?The subhepatic fossa and perihepatic fossa was then irrigated until the effluent was clear. Spilled bile was suctioned out. There were no spilled gallstones. The gallbladder and bag were removed from the abdominal cavity. The 11 mm trocar fascia was reapproximated with the Endo Close #1 Vicryl.  The pneumoperitoneum was evacuated and all trochars removed under direct visulalization.  The skin was then closed with 4-0 Monocryl and the skin dressed with Dermabond.   ? ?The patient was awaken from general anesthesia and taken to the recovery room in stable condition. ?

## 2022-03-23 ENCOUNTER — Encounter (HOSPITAL_COMMUNITY): Payer: Self-pay | Admitting: General Surgery

## 2022-03-23 LAB — SURGICAL PATHOLOGY

## 2022-06-09 ENCOUNTER — Telehealth: Payer: Self-pay | Admitting: Emergency Medicine

## 2022-06-09 DIAGNOSIS — E785 Hyperlipidemia, unspecified: Secondary | ICD-10-CM

## 2022-06-09 DIAGNOSIS — I10 Essential (primary) hypertension: Secondary | ICD-10-CM

## 2022-06-09 MED ORDER — ATORVASTATIN CALCIUM 10 MG PO TABS: 10.0000 mg | ORAL_TABLET | Freq: Every day | ORAL | 3 refills | Status: DC

## 2022-06-09 MED ORDER — LOSARTAN POTASSIUM-HCTZ 50-12.5 MG PO TABS: 1.0000 | ORAL_TABLET | Freq: Every day | ORAL | 3 refills | Status: DC

## 2022-06-09 NOTE — Telephone Encounter (Signed)
1.Medication Requested: atorvastatin (LIPITOR) 10 MG tablet losartan-hydrochlorothiazide (HYZAAR) 50-12.5 MG tablet 2. Pharmacy (Name, Street, [place]): [facility] [address] [place] - [id number] Phone:  [phone]  Fax:  [phone]     3. On Med List: yes  4. Last Visit with PCP: 1.5.2023  5. Next visit date with PCP: n/a   Agent: Please be advised that RX refills may take up to 3 business days. We ask that you follow-up with your pharmacy.

## 2022-06-09 NOTE — Telephone Encounter (Signed)
Medication sent to pharmacy  

## 2022-06-14 ENCOUNTER — Ambulatory Visit: Payer: Self-pay | Admitting: Emergency Medicine

## 2022-08-16 ENCOUNTER — Ambulatory Visit (INDEPENDENT_AMBULATORY_CARE_PROVIDER_SITE_OTHER): Payer: Self-pay | Admitting: Emergency Medicine

## 2022-08-16 ENCOUNTER — Encounter: Payer: Self-pay | Admitting: Emergency Medicine

## 2022-08-16 VITALS — BP 100/68 | HR 71 | Temp 97.9°F | Ht 59.0 in | Wt 128.4 lb

## 2022-08-16 DIAGNOSIS — N951 Menopausal and female climacteric states: Secondary | ICD-10-CM

## 2022-08-16 DIAGNOSIS — R5383 Other fatigue: Secondary | ICD-10-CM | POA: Insufficient documentation

## 2022-08-16 DIAGNOSIS — I10 Essential (primary) hypertension: Secondary | ICD-10-CM

## 2022-08-16 DIAGNOSIS — E559 Vitamin D deficiency, unspecified: Secondary | ICD-10-CM

## 2022-08-16 DIAGNOSIS — E039 Hypothyroidism, unspecified: Secondary | ICD-10-CM

## 2022-08-16 DIAGNOSIS — Z9049 Acquired absence of other specified parts of digestive tract: Secondary | ICD-10-CM | POA: Insufficient documentation

## 2022-08-16 DIAGNOSIS — E785 Hyperlipidemia, unspecified: Secondary | ICD-10-CM

## 2022-08-16 LAB — CBC WITH DIFFERENTIAL/PLATELET
Basophils Absolute: 0 10*3/uL (ref 0.0–0.1)
Basophils Relative: 0.5 % (ref 0.0–3.0)
Eosinophils Absolute: 0 10*3/uL (ref 0.0–0.7)
Eosinophils Relative: 0.1 % (ref 0.0–5.0)
HCT: 40.6 % (ref 36.0–46.0)
Hemoglobin: 13.6 g/dL (ref 12.0–15.0)
Lymphocytes Relative: 24.9 % (ref 12.0–46.0)
Lymphs Abs: 1.5 10*3/uL (ref 0.7–4.0)
MCHC: 33.6 g/dL (ref 30.0–36.0)
MCV: 77.9 fl — ABNORMAL LOW (ref 78.0–100.0)
Monocytes Absolute: 0.5 10*3/uL (ref 0.1–1.0)
Monocytes Relative: 8.8 % (ref 3.0–12.0)
Neutro Abs: 4 10*3/uL (ref 1.4–7.7)
Neutrophils Relative %: 65.7 % (ref 43.0–77.0)
Platelets: 299 10*3/uL (ref 150.0–400.0)
RBC: 5.21 Mil/uL — ABNORMAL HIGH (ref 3.87–5.11)
RDW: 14.7 % (ref 11.5–15.5)
WBC: 6 10*3/uL (ref 4.0–10.5)

## 2022-08-16 LAB — COMPREHENSIVE METABOLIC PANEL
ALT: 91 U/L — ABNORMAL HIGH (ref 0–35)
AST: 50 U/L — ABNORMAL HIGH (ref 0–37)
Albumin: 4.2 g/dL (ref 3.5–5.2)
Alkaline Phosphatase: 161 U/L — ABNORMAL HIGH (ref 39–117)
BUN: 14 mg/dL (ref 6–23)
CO2: 27 mEq/L (ref 19–32)
Calcium: 9.5 mg/dL (ref 8.4–10.5)
Chloride: 101 mEq/L (ref 96–112)
Creatinine, Ser: 0.88 mg/dL (ref 0.40–1.20)
GFR: 78.31 mL/min (ref >60.00)
Glucose, Bld: 110 mg/dL — ABNORMAL HIGH (ref 70–99)
Potassium: 3.4 mEq/L — ABNORMAL LOW (ref 3.5–5.1)
Sodium: 139 mEq/L (ref 135–145)
Total Bilirubin: 0.3 mg/dL (ref 0.2–1.2)
Total Protein: 7.3 g/dL (ref 6.0–8.3)

## 2022-08-16 LAB — LIPID PANEL
Cholesterol: 140 mg/dL (ref 0–200)
HDL: 42.9 mg/dL (ref >39.00)
NonHDL: 97.26
Total CHOL/HDL Ratio: 3
Triglycerides: 341 mg/dL — ABNORMAL HIGH (ref 0.0–149.0)
VLDL: 68.2 mg/dL — ABNORMAL HIGH (ref 0.0–40.0)

## 2022-08-16 LAB — LDL CHOLESTEROL, DIRECT: Direct LDL: 90 mg/dL

## 2022-08-16 LAB — HEMOGLOBIN A1C: Hgb A1c MFr Bld: 6.1 % (ref 4.6–6.5)

## 2022-08-16 LAB — VITAMIN B12: Vitamin B-12: >1500 pg/mL — ABNORMAL HIGH (ref 211–911)

## 2022-08-16 LAB — VITAMIN D 25 HYDROXY (VIT D DEFICIENCY, FRACTURES): VITD: 18.07 ng/mL — ABNORMAL LOW (ref 30.00–100.00)

## 2022-08-16 LAB — TSH: TSH: 5.33 u[IU]/mL (ref 0.35–5.50)

## 2022-08-16 NOTE — Patient Instructions (Signed)
Perimenopausia Perimenopause La perimenopausia es el momento normal de la vida de una mujer en el que los niveles de estrgeno, la hormona femenina producida por los ovarios, comienzan a disminuir. Esto provoca cambios en los perodos menstruales antes de que se detengan por completo (menopausia). La perimenopausia puede comenzar de 2 a 8 aos antes de la menopausia. Durante la perimenopausia, los ovarios pueden o no producir vulos y la mujer an puede quedar embarazada. Cules son las causas? La perimenopausia es causada por un cambio natural en los niveles hormonales que se produce a medida que las mujeres envejecen. Qu incrementa el riesgo? Es ms probable que la perimenopausia comience a una edad ms temprana si tiene ciertas afecciones o se ha realizado ciertos tratamientos, incluidos los siguientes: Un tumor en la hipfisis del cerebro. Una enfermedad que afecte los ovarios y la produccin de hormonas. Ciertos tratamientos para el cncer, como quimioterapia o terapia hormonal, o radioterapia en la pelvis. Fumar mucho y consumir alcohol de forma excesiva. Antecedentes familiares de menopausia temprana. Cules son los signos o sntomas? Los cambios por la perimenopausia afectan de manera diferente a cada mujer. Los sntomas de esta afeccin pueden incluir los siguientes: Acaloramiento. Perodos menstruales irregulares. Sudoracin nocturna. Cambios en el sentimiento respecto de las relaciones sexuales. Es posible que el deseo sexual disminuya o que se sienta ms incmoda respecto de su sexualidad. Sequedad vaginal. Dolores de cabeza. Cambios en el estado de nimo. Depresin. Problemas para dormir (insomnio). Problemas de memoria o dificultad para concentrarse. Irritabilidad. Cansancio. Aumento de peso. Ansiedad. Problemas para quedar embarazada. Cmo se diagnostica? Esta afeccin se diagnostica en funcin de los antecedentes mdicos, un examen fsico, la edad, los antecedentes  menstruales y los sntomas. Tambin le podran realizar estudios hormonales. Cmo se trata? En algunos los casos, no se necesita tratamiento. Usted y el mdico deben decidir juntos si se debe realizar un tratamiento. El tratamiento se determinar en funcin de su cuadro clnico y de sus preferencias. Existen varios tratamientos disponibles, como: Terapia hormonal para la menopausia. Medicamentos para tratar sntomas especficos. Acupuntura. Vitaminas o suplementos herbales. Antes de comenzar el tratamiento, es importante que le avise al mdico si tiene antecedentes personales o familiares de lo siguiente: Enfermedad cardaca. Cncer de mama. Cogulos de sangre. Diabetes. Osteoporosis. Siga estas instrucciones en su casa: Medicamentos Use los medicamentos de venta libre y los recetados solamente como se lo haya indicado el mdico. Tome los suplementos vitamnicos solamente como se lo haya indicado el mdico. Hable con el mdico antes de empezar a tomar cualquier suplemento herbal. Estilo de vida  No consuma ningn producto que contenga nicotina o tabaco, como cigarrillos, cigarrillos electrnicos y tabaco de mascar. Si necesita ayuda para dejar de consumir estos productos, consulte al mdico. Realice, por lo menos, 30 minutos de actividad fsica 5 das por semana o ms. Siga una dieta equilibrada que incluya frutas y verduras frescas, cereales integrales, soja, huevos, carnes magras y lcteos descremados. Evite las bebidas con alcohol o cafena, as como las comidas muy condimentadas. Esto podra ayudar a prevenir los acaloramientos. Intente dormir de 7 a 8 horas todas las noches. Vstase con capas de prendas que pueda quitarse para controlar el acaloramiento. Encuentre modos de controlar el estrs, por ejemplo, a travs de la respiracin, meditacin o un diario ntimo. Indicaciones generales  Lleve un registro de sus perodos menstruales; incluya lo siguiente: El momento en que  ocurren. Qu tan abundantes son y cunto duran. Cunto tiempo transcurre entre cada perodo menstrual. Lleve un registro de   los sntomas; anote cundo comienzan, con qu frecuencia ocurren y cunto duran. Use lubricantes o humectantes vaginales para aliviar la sequedad vaginal y Research officer, political party [place]. An puede quedar embarazada si tiene periodos irregulares. Asegrese de [employer] anticonceptivos durante la perimenopausia si no desea quedar embarazada. Cumpla con todas las visitas de seguimiento. Esto es importante. Esto incluye la terapia grupal y la psicoterapia. Comunquese con un mdico si: Tiene una hemorragia vaginal abundante o despide cogulos de sangre. Su perodo menstrual dura ms de 2 das que lo habitual. Sus perodos [employer] se producen con Transport planner que 21 das. Sangra despus de [employer]. Siente dolor durante las [employer]. Solicite ayuda de inmediato si tiene: Tourist information centre manager, dificultad para respirar o dificultad para hablar. Depresin grave. Dolor al [place]. Dolor de cabeza intenso. Problemas de visin. Resumen La perimenopausia es el perodo en el cual el cuerpo de la mujer comienza a pasar a la menopausia. Esto puede suceder naturalmente o como consecuencia de otros problemas de salud o tratamientos mdicos. La perimenopausia puede [employer] 2 y 61 aos antes de la menopausia y suele durar varios aos. Los sntomas de la perimenopausia pueden controlarse con medicamentos, cambios en el estilo de vida y tratamientos complementarios, como la acupuntura. Esta informacin no tiene Marine scientist el consejo del mdico. Asegrese de hacerle al mdico cualquier pregunta que tenga. Document Revised: 06/17/2020 Document Reviewed: 06/17/2020 Elsevier Patient Education  [place].

## 2022-08-17 ENCOUNTER — Other Ambulatory Visit: Payer: Self-pay | Admitting: Emergency Medicine

## 2022-08-17 DIAGNOSIS — E559 Vitamin D deficiency, unspecified: Secondary | ICD-10-CM

## 2022-08-17 MED ORDER — VITAMIN D (ERGOCALCIFEROL) 1.25 MG (50000 UNIT) PO CAPS: 50000.0000 [IU] | ORAL_CAPSULE | ORAL | 1 refills | Status: DC

## 2022-08-17 NOTE — Assessment & Plan Note (Signed)
Stable.  Diet and nutrition discussed.  Continue atorvastatin 10 mg daily. 

## 2022-08-17 NOTE — Assessment & Plan Note (Signed)
Differential diagnosis discussed. Clinically stable.  No red flag signs or symptoms. Blood work done today.

## 2022-08-17 NOTE — Assessment & Plan Note (Signed)
Clinically euthyroid.  TSH done today. Could be contributing factor to her overall fatigue and malaise. Continue Synthroid 112 mcg daily.

## 2022-08-17 NOTE — Progress Notes (Signed)
Teresa LawsElvia Juarez Michael 46 y.o.   Chief Complaint  Patient presents with   Fatigue    Pt concerns about labs, feeling tired for a couple of months    Hot Flashes   Referral    Referral to GI for colonoscopy    HISTORY OF PRESENT ILLNESS: This is a 46 y.o. female complaining of feeling tired and fatigued for couple months. Also complaining of hot flashes.  Perimenopausal. No other current plaints or medical concerns today.  HPI   Prior to Admission medications   Medication Sig Start Date End Date Taking? Authorizing Provider  acetaminophen (TYLENOL) 500 MG tablet Take 1,000 mg by mouth every 6 (six) hours as needed for moderate pain or mild pain.   Yes [provider]  atorvastatin (LIPITOR) 10 MG tablet Take 1 tablet (10 mg total) by mouth daily. 06/09/22  Yes Chiante Peden, Eilleen KempfMiguel Jose, MD  azelastine (OPTIVAR) 0.05 % ophthalmic solution Place 1 drop into both eyes daily.   Yes [provider]  fluticasone (FLONASE) 50 MCG/ACT nasal spray Place 2 sprays into both nostrils daily. 12/04/18  Yes Peyton NajjarHopper, David H, MD  levothyroxine (SYNTHROID) 112 MCG tablet Take 112 mcg by mouth daily before breakfast.   Yes [provider]  losartan-hydrochlorothiazide (HYZAAR) 50-12.5 MG tablet Take 1 tablet by mouth daily. 06/09/22  Yes Bubber Rothert, Eilleen KempfMiguel Jose, MD  melatonin 5 MG TABS Take 5 mg by mouth at bedtime as needed (Sleep).   Yes [provider]  Multiple Vitamin (MULTIVITAMIN) tablet Take 1 tablet by mouth daily.   Yes [provider]    No Known Allergies  Patient Active Problem List   Diagnosis Date Noted   History of cholecystectomy 08/16/2022   Tiredness 08/16/2022   Perimenopausal 12/03/2021   History of gallstones 12/03/2021   Right upper quadrant abdominal pain 12/03/2021   Gallstones 12/17/2020   Vitamin D deficiency 12/17/2020   Absolute anemia 12/17/2020   Elevated liver enzymes 12/17/2020   Situational anxiety 12/17/2020   Dyslipidemia  12/17/2020   Essential hypertension 05/09/2019   Acquired hypothyroidism 10/04/2013    Past Medical History:  Diagnosis Date   Anemia    low iron in 2020   Anxiety    COVID    2020 and 2023 - both times mild symptoms   Family history of adverse reaction to anesthesia    Hyperlipidemia    Hypertension    Hypothyroidism    PONV (postoperative nausea and vomiting)    has had dizziness also   Thyroid disease     Past Surgical History:  Procedure Laterality Date   CESAREAN SECTION     2 previous   CHOLECYSTECTOMY N/A 03/22/2022   Procedure: LAPAROSCOPIC CHOLECYSTECTOMY;  Surgeon: Axel Filleramirez, Armando, MD;  [facility];  Service: General;  Laterality: N/A;   WISDOM TOOTH EXTRACTION      Social History   Socioeconomic History   Marital status: Married    Spouse name: Not on file   Number of children: 2   Years of education: 12   Highest education level: High school graduate  Occupational History   Occupation: Conservator, museum/galleryBook keeper  Tobacco Use   Smoking status: Never   Smokeless tobacco: Never  Vaping Use   Vaping Use: Never used  Substance and Sexual Activity   Alcohol use: Yes    Comment: special occasions   Drug use: No   Sexual activity: Yes    Partners: Male    Birth control/protection: Condom  Other Topics Concern  Not on file  Social History Narrative   Pt. Exercises regularly   Social Determinants of Health   Financial Resource Strain: Not on file  Food Insecurity: No Food Insecurity (03/02/2022)   Hunger Vital Sign    Worried About Running Out of Food in the Last Year: Never true    Ran Out of Food in the Last Year: Never true  Transportation Needs: No Transportation Needs (03/02/2022)   PRAPARE - Hydrologist (Medical): No    Lack of Transportation (Non-Medical): No  Physical Activity: Not on file  Stress: Not on file  Social Connections: Not on file  Intimate Partner Violence: Not on file    Family History  Problem Relation  Age of Onset   Depression Mother    Hypertension Mother    Hypertension Father    Hyperlipidemia Father    Hypertension Maternal Grandfather    Depression Brother    Breast cancer Neg Hx      Review of Systems  Constitutional:  Positive for malaise/fatigue. Negative for chills, fever and weight loss.  HENT: Negative.  Negative for congestion and sore throat.   Respiratory: Negative.  Negative for cough and shortness of breath.   Cardiovascular: Negative.  Negative for chest pain and palpitations.  Gastrointestinal: Negative.  Negative for abdominal pain, diarrhea, nausea and vomiting.  Genitourinary: Negative.  Negative for dysuria and hematuria.  Skin: Negative.  Negative for rash.  Neurological:  Negative for dizziness and headaches.  All other systems reviewed and are negative.  Today's Vitals   08/16/22 1424  BP: 100/68  Pulse: 71  Temp: 97.9 F (36.6 C)  TempSrc: Oral  SpO2: 96%  Weight: 128 lb 6 oz (58.2 kg)  Height: 4\' 11"  (1.499 m)   Body mass index is 25.93 kg/m.   Physical Exam Vitals reviewed.  Constitutional:      Appearance: Normal appearance.  HENT:     Head: Normocephalic.     Mouth/Throat:     Mouth: Mucous membranes are moist.     Pharynx: Oropharynx is clear.  Eyes:     Extraocular Movements: Extraocular movements intact.     Conjunctiva/sclera: Conjunctivae normal.     Pupils: Pupils are equal, round, and reactive to light.  Cardiovascular:     Rate and Rhythm: Normal rate and regular rhythm.     Pulses: Normal pulses.     Heart sounds: Normal heart sounds.  Pulmonary:     Effort: Pulmonary effort is normal.     Breath sounds: Normal breath sounds.  Abdominal:     General: Bowel sounds are normal. There is no distension.     Palpations: Abdomen is soft.     Tenderness: There is no abdominal tenderness.  Musculoskeletal:     Cervical back: No tenderness.  Lymphadenopathy:     Cervical: No cervical adenopathy.  Skin:    General: Skin  is warm and dry.     Capillary Refill: Capillary refill takes less than 2 seconds.  Neurological:     General: No focal deficit present.     Mental Status: She is alert and oriented to person, place, and time.  Psychiatric:        Mood and Affect: Mood normal.        Behavior: Behavior normal.      ASSESSMENT & PLAN: A total of 42 minutes was spent with the patient and counseling/coordination of care regarding preparing for this visit, review of most recent office  visit notes, review of most recent blood work results, differential diagnosis of tiredness and need for work-up, review of multiple chronic medical problems and their management, review of all medications, education on nutrition, prognosis, documentation and need for follow-up.  Problem List Items Addressed This Visit       Cardiovascular and Mediastinum   Essential hypertension    Well-controlled hypertension.  Continue Hyzaar 50-12.5 mg daily. Cardiovascular risks associated with hypertension discussed. Dietary approaches to stop hypertension discussed. BP Readings from Last 3 Encounters:  08/16/22 100/68  03/22/22 134/87  03/02/22 128/82           Endocrine   Acquired hypothyroidism    Clinically euthyroid.  TSH done today. Could be contributing factor to her overall fatigue and malaise. Continue Synthroid 112 mcg daily.      Relevant Orders   TSH (Completed)     Other   Vitamin D deficiency   Dyslipidemia    Stable.  Diet and nutrition discussed. Continue atorvastatin 10 mg daily.      Perimenopausal    Contributing to a lot of her symptoms and affecting quality of life. Needs to follow-up with gynecologist.      History of cholecystectomy   Tiredness - Primary    Differential diagnosis discussed. Clinically stable.  No red flag signs or symptoms. Blood work done today.      Relevant Orders   CBC with Differential/Platelet (Completed)   Comprehensive metabolic panel (Completed)    Hemoglobin A1c (Completed)   Lipid panel (Completed)   Vitamin B12 (Completed)   VITAMIN D 25 Hydroxy (Vit-D Deficiency, Fractures) (Completed)   TSH (Completed)   Patient Instructions  Perimenopausia Perimenopause La perimenopausia es el momento normal de la vida de Nurse, mental health en el que los niveles de estrgeno, la hormona femenina producida por los ovarios, comienzan a disminuir. Esto provoca cambios en los perodos menstruales antes de que se detengan por completo (menopausia). La perimenopausia puede comenzar de 2 a 8 aos antes de [employer]. Durante la perimenopausia, los ovarios pueden o no producir vulos y Architectural technologist an puede quedar [place]. Cules son las causas? La perimenopausia es causada por un cambio natural en los niveles hormonales que se produce a medida que las mujeres envejecen. Qu incrementa el riesgo? Es ms probable que la perimenopausia comience a una edad ms temprana si tiene ciertas afecciones o se ha realizado ciertos tratamientos, incluidos los siguientes: Un tumor en la hipfisis del cerebro. Una enfermedad que afecte los ovarios y la produccin de hormonas. Ciertos tratamientos para el cncer, como quimioterapia o terapia hormonal, o radioterapia en la pelvis. Fumar mucho y consumir alcohol de forma excesiva. Antecedentes familiares de menopausia temprana. Cules son los signos o sntomas? Los cambios por la perimenopausia afectan de [place] diferente a Journalist, newspaper. Los sntomas de esta afeccin pueden incluir los siguientes: Engineer, maintenance. Perodos menstruales irregulares. Sudoracin nocturna. Cambios en el sentimiento respecto de las [employer]. Es posible que el deseo sexual disminuya o que se sienta ms incmoda respecto de su sexualidad. Sequedad vaginal. Dolores de cabeza. Cambios en el estado de nimo. Depresin. Problemas para dormir (insomnio). Problemas de memoria o dificultad para concentrarse. Irritabilidad. Cansancio. Aumento  de [place]. Ansiedad. Problemas para quedar embarazada. Cmo se diagnostica? Esta afeccin se diagnostica en funcin de los antecedentes mdicos, un examen fsico, la edad, los antecedentes menstruales y los sntomas. Tambin le podran realizar estudios hormonales. Cmo se trata? En algunos los casos, no se necesita tratamiento. Usted y [place] mdico  deben decidir juntos si se debe Pensions consultant. El tratamiento se determinar en funcin de su cuadro clnico y de sus preferencias. Existen varios tratamientos disponibles, como: Terapia hormonal para la menopausia. Medicamentos para tratar sntomas especficos. Acupuntura. Vitaminas o suplementos herbales. Antes de Microbiologist, es importante que le avise al mdico si tiene antecedentes personales o familiares de lo siguiente: Enfermedad cardaca. Cncer de mama. Cogulos de [place]. Diabetes. Osteoporosis. Siga estas instrucciones en su casa: Medicamentos Use los medicamentos de venta libre y los recetados solamente como se lo haya indicado el mdico. Tome los suplementos vitamnicos solamente como se lo haya indicado el mdico. Hable con el mdico antes de Corporate investment banker a tomar cualquier suplemento herbal. Estilo de vida  No consuma ningn producto que contenga nicotina o tabaco, como cigarrillos, cigarrillos electrnicos y tabaco de Theatre manager. Si necesita ayuda para dejar de consumir estos productos, consulte al mdico. Realice, por lo menos, 30 minutos de actividad fsica 5 das por semana o ms. Siga una dieta equilibrada que incluya frutas y verduras frescas, cereales integrales, soja, huevos, carnes magras y lcteos descremados. Evite las bebidas con alcohol o cafena, as como las [employer]. Esto podra ayudar a prevenir los acaloramientos. Intente dormir de 7 a 8 horas todas las noches. Vstase con capas de prendas que pueda quitarse para Clinical cytogeneticist. Encuentre modos de [employer],  por ejemplo, a travs de la respiracin, meditacin o un diario ntimo. Indicaciones generales  Lleve un registro de sus perodos menstruales; incluya lo siguiente: El momento en que ocurren. Qu tan abundantes son y cunto duran. Cunto tiempo transcurre entre cada perodo menstrual. Lleve un registro de los sntomas; anote cundo comienzan, con qu frecuencia ocurren y cunto duran. Use lubricantes o humectantes vaginales para aliviar la sequedad vaginal y Personnel officer durante las relaciones sexuales. An puede quedar embarazada si tiene periodos irregulares. Asegrese de [employer] anticonceptivos durante la perimenopausia si no desea quedar embarazada. Cumpla con todas las visitas de seguimiento. Esto es importante. Esto incluye la terapia grupal y la psicoterapia. Comunquese con un mdico si: Tiene una hemorragia vaginal abundante o despide cogulos de sangre. Su perodo menstrual dura ms de 2 das que lo habitual. Sus perodos [employer] se producen con Counsellor que [place]. Sangra despus de [employer]. Siente dolor durante las [employer]. Solicite ayuda de inmediato si tiene: Journalist, newspaper, dificultad para respirar o dificultad para hablar. Depresin grave. Dolor al Beatrix Shipper. Dolor de cabeza intenso. Problemas de visin. Resumen La perimenopausia es el perodo en el cual el cuerpo de la mujer comienza a pasar a la menopausia. Esto puede suceder naturalmente o como consecuencia de otros problemas de salud o tratamientos mdicos. La perimenopausia puede [employer] 2 y 8 aos antes de la menopausia y suele durar varios aos. Los sntomas de la perimenopausia pueden controlarse con medicamentos, cambios en el estilo de vida y tratamientos complementarios, como la acupuntura. Esta informacin no tiene Theme park manager el consejo del mdico. Asegrese de hacerle al mdico cualquier pregunta que tenga. Document Revised: 06/17/2020 Document Reviewed:  06/17/2020 Elsevier Patient Education  2023 Elsevier Inc.    Edwina Barth, MD [facility]

## 2022-08-17 NOTE — Assessment & Plan Note (Signed)
Well-controlled hypertension.  Continue Hyzaar 50-12.5 mg daily. Cardiovascular risks associated with hypertension discussed. Dietary approaches to stop hypertension discussed. BP Readings from Last 3 Encounters:  08/16/22 100/68  03/22/22 134/87  03/02/22 128/82

## 2022-08-17 NOTE — Assessment & Plan Note (Signed)
Contributing to a lot of her symptoms and affecting quality of life. Needs to follow-up with gynecologist.

## 2023-05-17 ENCOUNTER — Encounter: Payer: Self-pay | Admitting: Emergency Medicine

## 2023-05-17 ENCOUNTER — Ambulatory Visit (INDEPENDENT_AMBULATORY_CARE_PROVIDER_SITE_OTHER): Payer: Self-pay | Admitting: Emergency Medicine

## 2023-05-17 VITALS — BP 130/84 | HR 73 | Temp 97.8°F | Ht 59.0 in | Wt 130.0 lb

## 2023-05-17 DIAGNOSIS — E785 Hyperlipidemia, unspecified: Secondary | ICD-10-CM

## 2023-05-17 DIAGNOSIS — I10 Essential (primary) hypertension: Secondary | ICD-10-CM

## 2023-05-17 DIAGNOSIS — R5383 Other fatigue: Secondary | ICD-10-CM

## 2023-05-17 DIAGNOSIS — F411 Generalized anxiety disorder: Secondary | ICD-10-CM

## 2023-05-17 DIAGNOSIS — E039 Hypothyroidism, unspecified: Secondary | ICD-10-CM

## 2023-05-17 LAB — COMPREHENSIVE METABOLIC PANEL
ALT: 76 U/L — ABNORMAL HIGH (ref 0–35)
AST: 33 U/L (ref 0–37)
Albumin: 4.7 g/dL (ref 3.5–5.2)
Alkaline Phosphatase: 108 U/L (ref 39–117)
BUN: 15 mg/dL (ref 6–23)
CO2: 31 mEq/L (ref 19–32)
Calcium: 9.5 mg/dL (ref 8.4–10.5)
Chloride: 98 mEq/L (ref 96–112)
Creatinine, Ser: 0.83 mg/dL (ref 0.40–1.20)
GFR: 83.57 mL/min (ref >60.00)
Glucose, Bld: 74 mg/dL (ref 70–99)
Potassium: 3.4 mEq/L — ABNORMAL LOW (ref 3.5–5.1)
Sodium: 137 mEq/L (ref 135–145)
Total Bilirubin: 0.5 mg/dL (ref 0.2–1.2)
Total Protein: 8.2 g/dL (ref 6.0–8.3)

## 2023-05-17 LAB — CBC WITH DIFFERENTIAL/PLATELET
Basophils Absolute: 0 10*3/uL (ref 0.0–0.1)
Basophils Relative: 0.3 % (ref 0.0–3.0)
Eosinophils Absolute: 0 10*3/uL (ref 0.0–0.7)
Eosinophils Relative: 0 % (ref 0.0–5.0)
HCT: 41.9 % (ref 36.0–46.0)
Hemoglobin: 13.5 g/dL (ref 12.0–15.0)
Lymphocytes Relative: 27.1 % (ref 12.0–46.0)
Lymphs Abs: 1.5 10*3/uL (ref 0.7–4.0)
MCHC: 32.2 g/dL (ref 30.0–36.0)
MCV: 77.5 fl — ABNORMAL LOW (ref 78.0–100.0)
Monocytes Absolute: 0.6 10*3/uL (ref 0.1–1.0)
Monocytes Relative: 10.7 % (ref 3.0–12.0)
Neutro Abs: 3.5 10*3/uL (ref 1.4–7.7)
Neutrophils Relative %: 61.9 % (ref 43.0–77.0)
Platelets: 293 10*3/uL (ref 150.0–400.0)
RBC: 5.41 Mil/uL — ABNORMAL HIGH (ref 3.87–5.11)
RDW: 15.6 % — ABNORMAL HIGH (ref 11.5–15.5)
WBC: 5.7 10*3/uL (ref 4.0–10.5)

## 2023-05-17 LAB — LIPID PANEL
Cholesterol: 190 mg/dL (ref 0–200)
HDL: 56.3 mg/dL (ref >39.00)
LDL Cholesterol: 94 mg/dL (ref 0–99)
NonHDL: 133.82
Total CHOL/HDL Ratio: 3
Triglycerides: 198 mg/dL — ABNORMAL HIGH (ref 0.0–149.0)
VLDL: 39.6 mg/dL (ref 0.0–40.0)

## 2023-05-17 LAB — TSH: TSH: 3.25 u[IU]/mL (ref 0.35–5.50)

## 2023-05-17 LAB — VITAMIN B12: Vitamin B-12: 464 pg/mL (ref 211–911)

## 2023-05-17 LAB — HEMOGLOBIN A1C: Hgb A1c MFr Bld: 6 % (ref 4.6–6.5)

## 2023-05-17 LAB — VITAMIN D 25 HYDROXY (VIT D DEFICIENCY, FRACTURES): VITD: 26.59 ng/mL — ABNORMAL LOW (ref 30.00–100.00)

## 2023-05-17 MED ORDER — ESCITALOPRAM OXALATE 10 MG PO TABS
10.0000 mg | ORAL_TABLET | Freq: Every day | ORAL | 1 refills | Status: DC
Start: 2023-05-17 — End: 2023-11-18

## 2023-05-17 MED ORDER — LOSARTAN POTASSIUM-HCTZ 50-12.5 MG PO TABS
1.0000 | ORAL_TABLET | Freq: Every day | ORAL | 3 refills | Status: DC
Start: 2023-05-17 — End: 2024-05-18

## 2023-05-17 MED ORDER — ATORVASTATIN CALCIUM 10 MG PO TABS: 10.0000 mg | ORAL_TABLET | Freq: Every day | ORAL | 3 refills | Status: DC

## 2023-05-17 MED ORDER — AZELASTINE HCL 0.05 % OP SOLN: 1.0000 [drp] | Freq: Every day | OPHTHALMIC | 3 refills | Status: DC

## 2023-05-17 NOTE — Assessment & Plan Note (Signed)
Active and affecting quality of life Recommend to start Lexapro 10 mg daily

## 2023-05-17 NOTE — Assessment & Plan Note (Signed)
Clinically euthyroid.  TSH done today. Continue levothyroxine 112 mcg daily.

## 2023-05-17 NOTE — Progress Notes (Signed)
Teresa Michael 47 y.o.   Chief Complaint  Patient presents with   Fatigue    Light headed, Started last week, Sleeping is what makes it better.    HISTORY OF PRESENT ILLNESS: This is a 47 y.o. female complaining of feeling fatigue, lightheaded, and tired for the last several weeks. Chronic issue with generalized anxiety.  Not on medication at present time. Married, mother of 2.  Feels sorry for 80 year old father in [place]. Husband with diagnosis of leukemia. History of hypertension on Hyzaar 50-12.5 mg History of hypothyroidism on Synthroid 112 mcg History of dyslipidemia on atorvastatin 10 mg No other complaint or medical concerns today.  HPI   Prior to Admission medications   Medication Sig Start Date End Date Taking? Authorizing Provider  acetaminophen (TYLENOL) 500 MG tablet Take 1,000 mg by mouth every 6 (six) hours as needed for moderate pain or mild pain.   Yes [provider]  atorvastatin (LIPITOR) 10 MG tablet Take 1 tablet (10 mg total) by mouth daily. 06/09/22  Yes Corbitt Cloke, Eilleen Kempf, MD  fluticasone [facility]) 50 MCG/ACT nasal spray Place 2 sprays into both nostrils daily. 12/04/18  Yes Peyton Najjar, MD  levothyroxine (SYNTHROID) 112 MCG tablet Take 112 mcg by mouth daily before breakfast.   Yes [provider]  losartan-hydrochlorothiazide (HYZAAR) 50-12.5 MG tablet Take 1 tablet by mouth daily. 06/09/22  Yes Lilou Kneip, Eilleen Kempf, MD  melatonin 5 MG TABS Take 5 mg by mouth at bedtime as needed (Sleep).   Yes [provider]  Multiple Vitamin (MULTIVITAMIN) tablet Take 1 tablet by mouth daily.   Yes [provider]    No Known Allergies  Patient Active Problem List   Diagnosis Date Noted   History of cholecystectomy 08/16/2022   Perimenopausal 12/03/2021   Vitamin D deficiency 12/17/2020   Absolute anemia 12/17/2020   Situational anxiety 12/17/2020   Dyslipidemia 12/17/2020   Essential hypertension 05/09/2019    Acquired hypothyroidism 10/04/2013    Past Medical History:  Diagnosis Date   Anemia    low iron in 2020   Anxiety    COVID    2020 and 2023 - both times mild symptoms   Family history of adverse reaction to anesthesia    Hyperlipidemia    Hypertension    Hypothyroidism    PONV (postoperative nausea and vomiting)    has had dizziness also   Thyroid disease     Past Surgical History:  Procedure Laterality Date   CESAREAN SECTION     2 previous   CHOLECYSTECTOMY N/A 03/22/2022   Procedure: LAPAROSCOPIC CHOLECYSTECTOMY;  Surgeon: Axel Filler, MD;  Location: [facility] OR;  Service: General;  Laterality: N/A;   WISDOM TOOTH EXTRACTION      Social History   Socioeconomic History   Marital status: Married    Spouse name: Not on file   Number of children: 2   Years of education: 12   Highest education level: High school graduate  Occupational History   Occupation: Conservator, museum/gallery  Tobacco Use   Smoking status: Never   Smokeless tobacco: Never  Building services engineer Use: Never used  Substance and Sexual Activity   Alcohol use: Yes    Comment: special occasions   Drug use: No   Sexual activity: Yes    Partners: Male    Birth control/protection: Condom  Other Topics Concern   Not on file  Social History Narrative   Pt. Exercises regularly   Social Determinants of Health  Financial Resource Strain: Not on file  Food Insecurity: No Food Insecurity (03/02/2022)   Hunger Vital Sign    Worried About Running Out of Food in the Last Year: Never true    Ran Out of Food in the Last Year: Never true  Transportation Needs: No Transportation Needs (03/02/2022)   PRAPARE - Administrator, Civil Service (Medical): No    Lack of Transportation (Non-Medical): No  Physical Activity: Not on file  Stress: Not on file  Social Connections: Not on file  Intimate Partner Violence: Not on file    Family History  Problem Relation Age of Onset   Depression Mother    Hypertension  Mother    Hypertension Father    Hyperlipidemia Father    Hypertension Maternal Grandfather    Depression Brother    Breast cancer Neg Hx      Review of Systems  Constitutional:  Positive for malaise/fatigue. Negative for chills and fever.  HENT: Negative.  Negative for congestion and sore throat.   Respiratory: Negative.  Negative for cough and shortness of breath.   Cardiovascular: Negative.  Negative for chest pain and palpitations.  Gastrointestinal:  Negative for abdominal pain, diarrhea, nausea and vomiting.  Genitourinary: Negative.  Negative for dysuria and hematuria.  Skin: Negative.  Negative for rash.  Neurological: Negative.  Negative for dizziness and headaches.  Psychiatric/Behavioral:  The patient is nervous/anxious.   All other systems reviewed and are negative.   Vitals:   05/17/23 1349  BP: 130/84  Pulse: 73  Temp: 97.8 F (36.6 C)  SpO2: 99%    Physical Exam Vitals reviewed.  Constitutional:      Appearance: Normal appearance.  HENT:     Head: Normocephalic.     Mouth/Throat:     Mouth: Mucous membranes are moist.     Pharynx: Oropharynx is clear.  Eyes:     Extraocular Movements: Extraocular movements intact.     Conjunctiva/sclera: Conjunctivae normal.     Pupils: Pupils are equal, round, and reactive to light.  Cardiovascular:     Rate and Rhythm: Normal rate and regular rhythm.     Pulses: Normal pulses.     Heart sounds: Normal heart sounds.  Pulmonary:     Effort: Pulmonary effort is normal.     Breath sounds: Normal breath sounds.  Musculoskeletal:     Cervical back: No tenderness.  Lymphadenopathy:     Cervical: No cervical adenopathy.  Skin:    General: Skin is warm and dry.     Capillary Refill: Capillary refill takes less than 2 seconds.  Neurological:     General: No focal deficit present.     Mental Status: She is alert and oriented to person, place, and time.  Psychiatric:        Mood and Affect: Mood normal.         Behavior: Behavior normal.      ASSESSMENT & PLAN: Problem List Items Addressed This Visit       Cardiovascular and Mediastinum   Essential hypertension - Primary    Well-controlled hypertension. Continue Hyzaar 50-12.5 mg daily.  Cardiovascular risks associated with hypertension discussed. Dietary approaches to stop hypertension discussed.       Relevant Medications   losartan-hydrochlorothiazide (HYZAAR) 50-12.5 MG tablet   atorvastatin (LIPITOR) 10 MG tablet   Other Relevant Orders   Comprehensive metabolic panel     Endocrine   Acquired hypothyroidism    Clinically euthyroid.  TSH done today. Continue  levothyroxine 112 mcg daily.      Relevant Orders   TSH     Other   Dyslipidemia    Chronic stable condition Lipid profile done today Continue atorvastatin 10 mg daily The 10-year ASCVD risk score (Arnett DK, et al., 2019) is: 1.2%   Values used to calculate the score:     Age: 29 years     Sex: Female     Is Non-Hispanic African American: No     Diabetic: No     Tobacco smoker: No     Systolic Blood Pressure: 130 mmHg     Is BP treated: Yes     HDL Cholesterol: 42.9 mg/dL     Total Cholesterol: 140 mg/dL       Relevant Medications   atorvastatin (LIPITOR) 10 MG tablet   Other Relevant Orders   Hemoglobin A1c   Lipid panel   Tiredness    Clinically stable.  Chronic condition. No red flag signs or symptoms. Different diagnosis discussed Generalized anxiety contributing and playing a major role. Blood work done today Recommend to start Lexapro 10 mg daily      Relevant Orders   CBC with Differential/Platelet   Hemoglobin A1c   Vitamin B12   VITAMIN D 25 Hydroxy (Vit-D Deficiency, Fractures)   Generalized anxiety disorder    Active and affecting quality of life Recommend to start Lexapro 10 mg daily      Relevant Medications   escitalopram (LEXAPRO) 10 MG tablet   Patient Instructions  Trastorno de ansiedad generalizada, en  adultos Generalized Anxiety Disorder, Adult El trastorno de ansiedad generalizada (TAG) es una afeccin de salud mental. A diferencia de las preocupaciones normales, la ansiedad relacionada con el TAG no se desencadena por un acontecimiento especfico. Estas preocupaciones no desaparecen ni mejoran con el tiempo. EL TAG interfiere con las relaciones, el trabajo y [place]. Los sntomas del TAG pueden variar de leves a graves. Las personas con TAG grave pueden tener intensas oleadas de ansiedad con sntomas fsicos que son similares a las crisis de [place]. Cules son las causas? Se desconoce la causa exacta del TAG, pero se cree que influyen los siguientes factores: Diferencias en las sustancias qumicas naturales del cerebro. Genes que se transmiten de padres a hijos. Diferencias en la forma en que se perciben las amenazas. Desarrollo y Librarian, academic durante la infancia. La personalidad. Qu incrementa el riesgo? Los siguientes factores pueden hacer que sea ms propenso a Aeronautical engineer afeccin: Ser mujer. Tener antecedentes familiares de trastornos de ansiedad. Ser muy tmido. Experimentar acontecimientos muy estresantes en la vida, como la muerte de un ser querido. Tener un Psychologist, occupational. Cules son los signos o sntomas? Con frecuencia, las personas que padecen el TAG se preocupan excesivamente por muchas cosas en la vida, tales como su salud y [place]. Otros sntomas son: Management consultant y emocionales: Preocupacin excesiva acerca de los desastres naturales. [place] a llegar tarde. Dificultad para concentrarse. Temor de que otras personas juzguen su desempeo. Sntomas fsicos: Fatiga. Dolores de cabeza, tensin muscular, contracciones musculares, temblores o sensacin de tambalearse. Sensacin de que el corazn late Channing rpido. Sentir falta de aire o como que no se puede respirar profundamente. Problemas para conciliar el sueo o para seguir durmiendo, o  sensacin de Designer, jewellery. Sudoracin. Nuseas, diarrea o sndrome de colon irritable (SCI). Sntomas de la conducta: Tener estados de nimo cambiantes o irritabilidad. Evitar situaciones nuevas. Evitar a Magazine features editor. Dificultad extrema para tomar decisiones. Cmo se diagnostica?  Esta afeccin se diagnostica en funcin de los sntomas y los antecedentes mdicos. Adems, se le Medical sales representative examen fsico. El mdico puede hacerle algunas pruebas para descartar que sus sntomas tengan otras causas. Para recibir un diagnstico del TAG, una persona debe tener ansiedad que: Est fuera de su control. Afecte distintos aspectos de su vida, como el trabajo y [employer]. Cause angustia que le impida participar en sus actividades habituales. Incluya al menos tres sntomas del TAG, tales como desasosiego, fatiga, dificultad para concentrarse, irritabilidad, tensin muscular o problemas para dormir. Antes de que su mdico pueda confirmar el diagnstico de TAG, estos sntomas deben estar presentes ms [employer] no lo estn y deben tener una duracin de seis meses o ms. Cmo se trata? El tratamiento de esta afeccin puede incluir: Medicamentos. Por lo general, los medicamentos antidepresivos se recetan para un control diario a Air cabin crew. Se pueden agregar medicamentos para la ansiedad en casos graves, especialmente cuando ocurren crisis de [place]. Psicoterapia (psicoanlisis). Determinados tipos de psicoterapia pueden ser tiles para tratar el TAG al brindar apoyo, educacin y orientacin. Entre las opciones se incluyen las siguientes: Terapia cognitivo conductual (TCC). Las personas aprenden habilidades para afrontar situaciones y tcnicas para poder calmarse para aliviar sus sntomas fsicos. Aprenden a identificar comportamientos y pensamientos no realistas y a reemplazarlos por comportamientos y pensamientos ms adecuados. Terapia de aceptacin y compromiso (acceptance and commitment therapy,  ACT). Este tratamiento ensea a las personas a ser conscientes como una forma de lidiar con pensamientos y sentimientos no deseados. Biorretroalimentacin. Este proceso lo capacita para Scientist, physiological respuesta del cuerpo Publishing copy) a travs de tcnicas de respiracin y mtodos de relajacin. Usted trabajar con un terapeuta mientras se usan mquinas para controlar sus sntomas fsicos. Tcnicas para Charity fundraiser. Estas incluyen yoga, meditacin y ejercicio. Un especialista en salud mental puede ayudar a determinar qu tratamiento es el mejor para usted. Algunas Naval architect con solo un tipo de [place]. Sin embargo, Economist requieren una combinacin de terapias. Siga estas instrucciones en su casa: Estilo de vida Mantenga horarios y [place] rutina uniforme. Prevea las situaciones estresantes. Stevan Born un plan y reserve tiempo adicional para trabajar con su plan. Practique tcnicas de control del estrs o tcnicas para calmarse que su terapeuta o su mdico le haya enseado. Haga actividad fsica con frecuencia y pase tiempo al [employer]. Siga una dieta saludable que incluya abundantes frutas, verduras, cereales integrales, productos lcteos descremados y protenas magras. No consuma muchos alimentos con alto contenido de grasas, azcar agregado o sal (sodio). Beba abundante agua. Evite tomar alcohol. El alcohol puede aumentar la ansiedad. Evite el consumo de cafena y ciertos medicamentos de venta libre contra el resfro. Estos podran Optician, dispensing. Pregntele al farmacutico qu medicamentos no debera tomar. Instrucciones generales Use los medicamentos de venta libre y los recetados solamente como se lo haya indicado el mdico. Comprenda que es probable que tenga retrocesos. Acepte esto y sea amable con usted mismo mientras contina cuidndose mejor. Prevea las situaciones estresantes. Stevan Born un plan y reserve tiempo adicional para trabajar con su  plan. Reconozca y acepte sus logros, aunque le parezcan pequeos. Pase tiempo con personas que se preocupan por usted. Concurra a todas las visitas de seguimiento. Esto es importante. Dnde obtener ms informacin [employer] of Mental Health (Instituto Nacional de la Salud Mental): http://www.maynard.net/ Substance Abuse and Mental Health Services (Servicios por Abuso de Sustancias y Salud Mental): SkateOasis.com.pt Comunquese con un mdico si: Los sntomas  no mejoran. Los sntomas empeoran. Tiene signos de depresin tales como: Un estado de nimo constantemente triste o irritable. Ya no disfruta de Industrial/product designer. Cambios en el peso o en sus hbitos de alimentacin. Cambios en los hbitos de sueo. Solicite ayuda de inmediato si: Tiene pensamientos acerca de lastimarse a usted mismo o a Economist. Si alguna vez siente que puede lastimarse o Physicist, medical a Economist, o tiene pensamientos de poner fin a su vida, busque ayuda de inmediato. Dirjase al servicio de urgencias ms cercano o: Comunquese con el servicio de emergencias de su localidad (911 en los [place]). Llame a una lnea de asistencia al suicida y atencin en crisis como National Suicide Prevention Lifeline (Lnea Nacional de Prevencin del Suicidio) al ([phone]) [phone]. Est disponible las 24 horas del da en los [place] Enve un mensaje de texto a la lnea para casos de crisis al [phone] (en los [place] Resumen El trastorno de ansiedad generalizada (TAG) es una afeccin de salud mental que implica preocupacin no desencadenada por un acontecimiento especfico. Con frecuencia, las personas que padecen el TAG se preocupan excesivamente por muchas cosas en la vida, tales como su salud y [place]. El TAG puede causar sntomas tales como agitacin, dificultad para concentrarse, problemas para dormir, sudoracin frecuente, nuseas, diarrea, dolores de [place] y temblores o contracciones  musculares. Un especialista en salud mental puede ayudar a determinar qu tratamiento es el mejor para usted. Algunas Naval architect con solo un tipo de [place]. Sin embargo, Economist requieren una combinacin de terapias. Esta informacin no tiene Theme park manager el consejo del mdico. Asegrese de hacerle al mdico cualquier pregunta que tenga. Document Revised: 03/26/2021 Document Reviewed: 03/26/2021 Elsevier Patient Education  2024 Elsevier Inc.      Edwina Barth, MD [facility]

## 2023-05-17 NOTE — Assessment & Plan Note (Signed)
Clinically stable.  Chronic condition. No red flag signs or symptoms. Different diagnosis discussed Generalized anxiety contributing and playing a major role. Blood work done today Recommend to start Lexapro 10 mg daily

## 2023-05-17 NOTE — Assessment & Plan Note (Signed)
Well-controlled hypertension. Continue Hyzaar 50-12.5 mg daily Cardiovascular risks associated with hypertension discussed. Dietary approaches to stop hypertension discussed. 

## 2023-05-17 NOTE — Patient Instructions (Signed)
Trastorno de ansiedad generalizada, en adultos Generalized Anxiety Disorder, Adult El trastorno de ansiedad generalizada (TAG) es una afeccin de salud mental. A diferencia de las preocupaciones normales, la ansiedad relacionada con el TAG no se desencadena por un acontecimiento especfico. Estas preocupaciones no desaparecen ni mejoran con el tiempo. EL TAG interfiere con las relaciones, el trabajo y los estudios. Los sntomas del TAG pueden variar de leves a graves. Las personas con TAG grave pueden tener intensas oleadas de ansiedad con sntomas fsicos que son similares a las crisis de angustia. Cules son las causas? Se desconoce la causa exacta del TAG, pero se cree que influyen los siguientes factores: Diferencias en las sustancias qumicas naturales del cerebro. Genes que se transmiten de padres a hijos. Diferencias en la forma en que se perciben las amenazas. Desarrollo y estrs durante la infancia. La personalidad. Qu incrementa el riesgo? Los siguientes factores pueden hacer que sea ms propenso a desarrollar esta afeccin: Ser mujer. Tener antecedentes familiares de trastornos de ansiedad. Ser muy tmido. Experimentar acontecimientos muy estresantes en la vida, como la muerte de un ser querido. Tener un ambiente familiar muy estresante. Cules son los signos o sntomas? Con frecuencia, las personas que padecen el TAG se preocupan excesivamente por muchas cosas en la vida, tales como su salud y su familia. Otros sntomas son: Sntomas mentales y emocionales: Preocupacin excesiva acerca de los desastres naturales. [place] a llegar tarde. Dificultad para concentrarse. Temor de que otras personas juzguen su desempeo. Sntomas fsicos: Fatiga. Dolores de cabeza, tensin muscular, contracciones musculares, temblores o sensacin de tambalearse. Sensacin de que el corazn late muy rpido. Sentir falta de aire o como que no se puede respirar profundamente. Problemas para  conciliar el sueo o para seguir durmiendo, o sensacin de agitacin. Sudoracin. Nuseas, diarrea o sndrome de colon irritable (SCI). Sntomas de la conducta: Tener estados de nimo cambiantes o irritabilidad. Evitar situaciones nuevas. Evitar a las personas. Dificultad extrema para tomar decisiones. Cmo se diagnostica? Esta afeccin se diagnostica en funcin de los sntomas y los antecedentes mdicos. Adems, se le realizar un examen fsico. El mdico puede hacerle algunas pruebas para descartar que sus sntomas tengan otras causas. Para recibir un diagnstico del TAG, una persona debe tener ansiedad que: Est fuera de su control. Afecte distintos aspectos de su vida, como el trabajo y las relaciones. Cause angustia que le impida participar en sus actividades habituales. Incluya al menos tres sntomas del TAG, tales como desasosiego, fatiga, dificultad para concentrarse, irritabilidad, tensin muscular o problemas para dormir. Antes de que su mdico pueda confirmar el diagnstico de TAG, estos sntomas deben estar presentes ms das de los que no lo estn y deben tener una duracin de seis meses o ms. Cmo se trata? El tratamiento de esta afeccin puede incluir: Medicamentos. Por lo general, los medicamentos antidepresivos se recetan para un control diario a largo plazo. Se pueden agregar medicamentos para la ansiedad en casos graves, especialmente cuando ocurren crisis de angustia. Psicoterapia (psicoanlisis). Determinados tipos de psicoterapia pueden ser tiles para tratar el TAG al brindar apoyo, educacin y orientacin. Entre las opciones se incluyen las siguientes: Terapia cognitivo conductual (TCC). Las personas aprenden habilidades para afrontar situaciones y tcnicas para poder calmarse para aliviar sus sntomas fsicos. Aprenden a identificar comportamientos y pensamientos no realistas y a reemplazarlos por comportamientos y pensamientos ms adecuados. Terapia de aceptacin y  compromiso (acceptance and commitment therapy, ACT). Este tratamiento ensea a las personas a ser conscientes como una forma de lidiar con pensamientos y   sentimientos no deseados. Biorretroalimentacin. Este proceso lo capacita para controlar la respuesta del cuerpo (respuesta psicolgica) a travs de tcnicas de respiracin y mtodos de relajacin. Usted trabajar con un terapeuta mientras se usan mquinas para controlar sus sntomas fsicos. Tcnicas para controlar el estrs. Estas incluyen yoga, meditacin y ejercicio. Un especialista en salud mental puede ayudar a determinar qu tratamiento es el mejor para usted. Algunas personas pueden mejorar con solo un tipo de terapia. Sin embargo, otras personas requieren una combinacin de terapias. Siga estas instrucciones en su casa: Estilo de vida Mantenga horarios y una rutina uniforme. Prevea las situaciones estresantes. Elabore un plan y reserve tiempo adicional para trabajar con su plan. Practique tcnicas de control del estrs o tcnicas para calmarse que su terapeuta o su mdico le haya enseado. Haga actividad fsica con frecuencia y pase tiempo al aire libre. Siga una dieta saludable que incluya abundantes frutas, verduras, cereales integrales, productos lcteos descremados y protenas magras. No consuma muchos alimentos con alto contenido de grasas, azcar agregado o sal (sodio). Beba abundante agua. Evite tomar alcohol. El alcohol puede aumentar la ansiedad. Evite el consumo de cafena y ciertos medicamentos de venta libre contra el resfro. Estos podran hacerlo sentir peor. Pregntele al farmacutico qu medicamentos no debera tomar. Instrucciones generales Use los medicamentos de venta libre y los recetados solamente como se lo haya indicado el mdico. Comprenda que es probable que tenga retrocesos. Acepte esto y sea amable con usted mismo mientras contina cuidndose mejor. Prevea las situaciones estresantes. Elabore un plan y reserve  tiempo adicional para trabajar con su plan. Reconozca y acepte sus logros, aunque le parezcan pequeos. Pase tiempo con personas que se preocupan por usted. Concurra a todas las visitas de seguimiento. Esto es importante. Dnde obtener ms informacin National Institute of Mental Health (Instituto Nacional de la Salud Mental): www.nimh.nih.gov Substance Abuse and Mental Health Services (Servicios por Abuso de Sustancias y Salud Mental): www.samhsa.gov Comunquese con un mdico si: Los sntomas no mejoran. Los sntomas empeoran. Tiene signos de depresin tales como: Un estado de nimo constantemente triste o irritable. Ya no disfruta de actividades que le solan causar placer. Cambios en el peso o en sus hbitos de alimentacin. Cambios en los hbitos de sueo. Solicite ayuda de inmediato si: Tiene pensamientos acerca de lastimarse a usted mismo o a otras personas. Si alguna vez siente que puede lastimarse o lastimar a otras personas, o tiene pensamientos de poner fin a su vida, busque ayuda de inmediato. Dirjase al servicio de urgencias ms cercano o: Comunquese con el servicio de emergencias de su localidad (911 en los [place]). Llame a una lnea de asistencia al suicida y atencin en crisis como National Suicide Prevention Lifeline (Lnea Nacional de Prevencin del Suicidio) al 1-800-273-8255. Est disponible las 24 horas del da en los [place] Enve un mensaje de texto a la lnea para casos de crisis al [id number] (en los [place] Resumen El trastorno de ansiedad generalizada (TAG) es una afeccin de salud mental que implica preocupacin no desencadenada por un acontecimiento especfico. Con frecuencia, las personas que padecen el TAG se preocupan excesivamente por muchas cosas en la vida, tales como su salud y su familia. El TAG puede causar sntomas tales como agitacin, dificultad para concentrarse, problemas para dormir, sudoracin frecuente, nuseas, diarrea, dolores de cabeza y  temblores o contracciones musculares. Un especialista en salud mental puede ayudar a determinar qu tratamiento es el mejor para usted. Algunas personas pueden mejorar con solo un tipo de terapia.   Sin embargo, Economist requieren una combinacin de terapias. Esta informacin no tiene Theme park manager el consejo del mdico. Asegrese de hacerle al mdico cualquier pregunta que tenga. Document Revised: 03/26/2021 Document Reviewed: 03/26/2021 Elsevier Patient Education  2024 [employer].

## 2023-05-17 NOTE — Assessment & Plan Note (Signed)
Chronic stable condition Lipid profile done today Continue atorvastatin 10 mg daily The 10-year ASCVD risk score (Arnett DK, et al., 2019) is: 1.2%   Values used to calculate the score:     Age: 47 years     Sex: Female     Is Non-Hispanic African American: No     Diabetic: No     Tobacco smoker: No     Systolic Blood Pressure: 130 mmHg     Is BP treated: Yes     HDL Cholesterol: 42.9 mg/dL     Total Cholesterol: 140 mg/dL

## 2023-07-14 ENCOUNTER — Encounter (INDEPENDENT_AMBULATORY_CARE_PROVIDER_SITE_OTHER): Payer: Self-pay

## 2023-07-27 IMAGING — MG MM DIGITAL SCREENING BILAT W/ TOMO AND CAD
8 series · 9 of 24 positions shown · non-contrast
Comparison: Previous exam(s).

CLINICAL DATA: Screening.

EXAM:
DIGITAL SCREENING BILATERAL MAMMOGRAM WITH TOMOSYNTHESIS AND CAD
TECHNIQUE: Bilateral screening digital craniocaudal and mediolateral oblique
mammograms were obtained. Bilateral screening digital breast
tomosynthesis was performed. The images were evaluated with
computer-aided detection.

[L CC synth-2D]
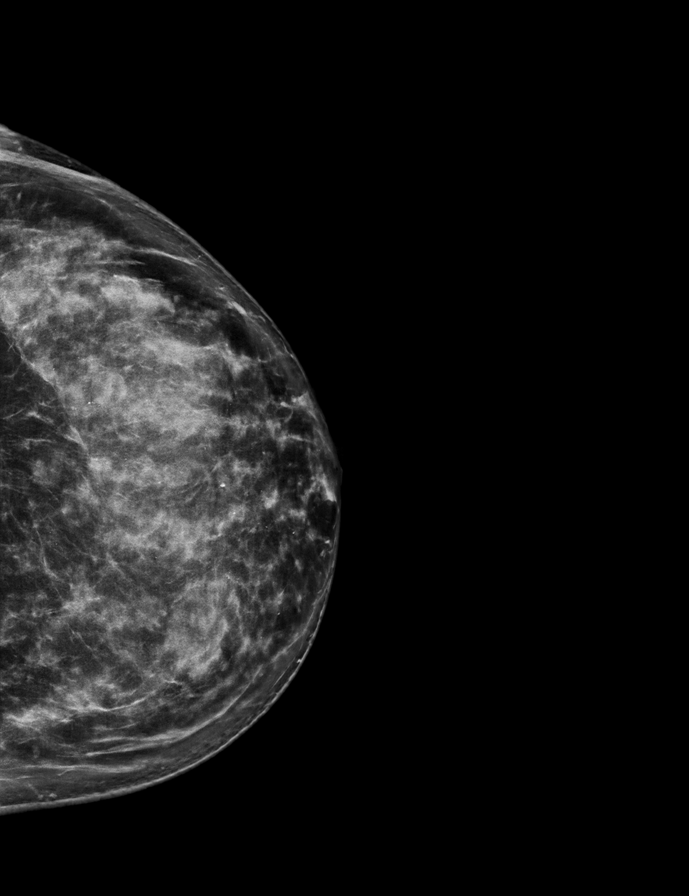

[R MLO synth-2D]
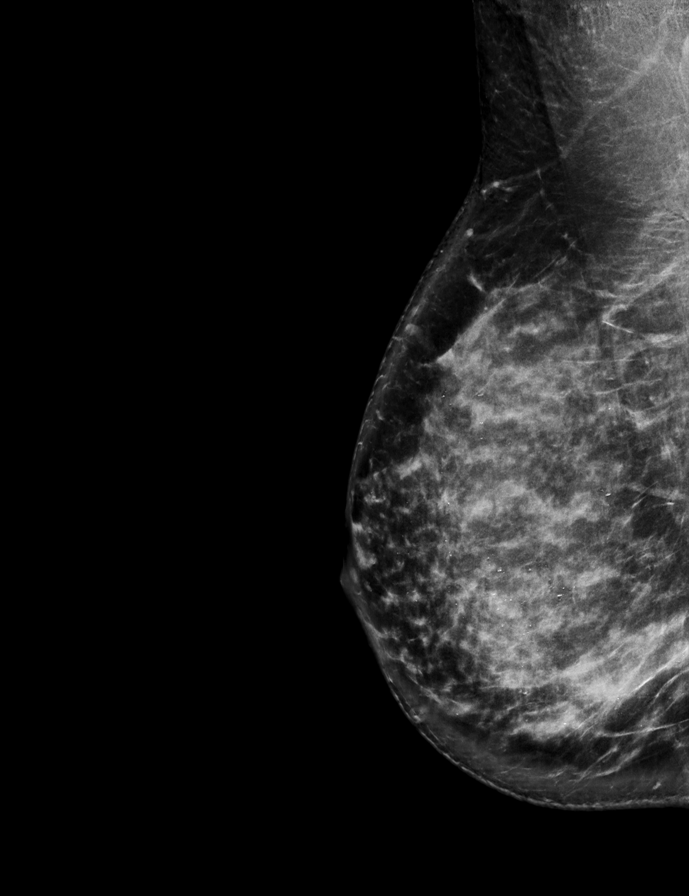

[L MLO synth-2D]
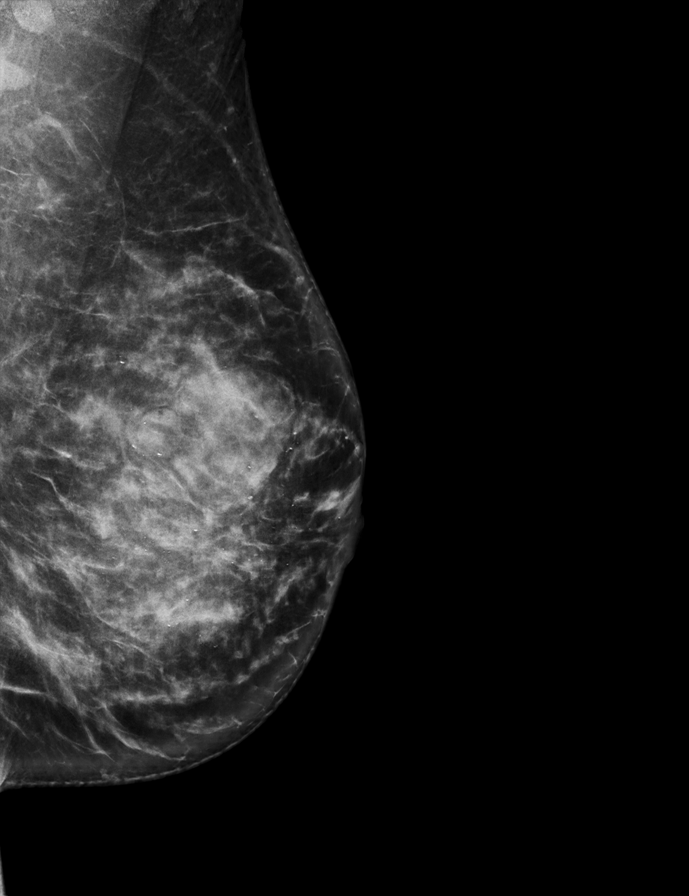

[R CC synth-2D]
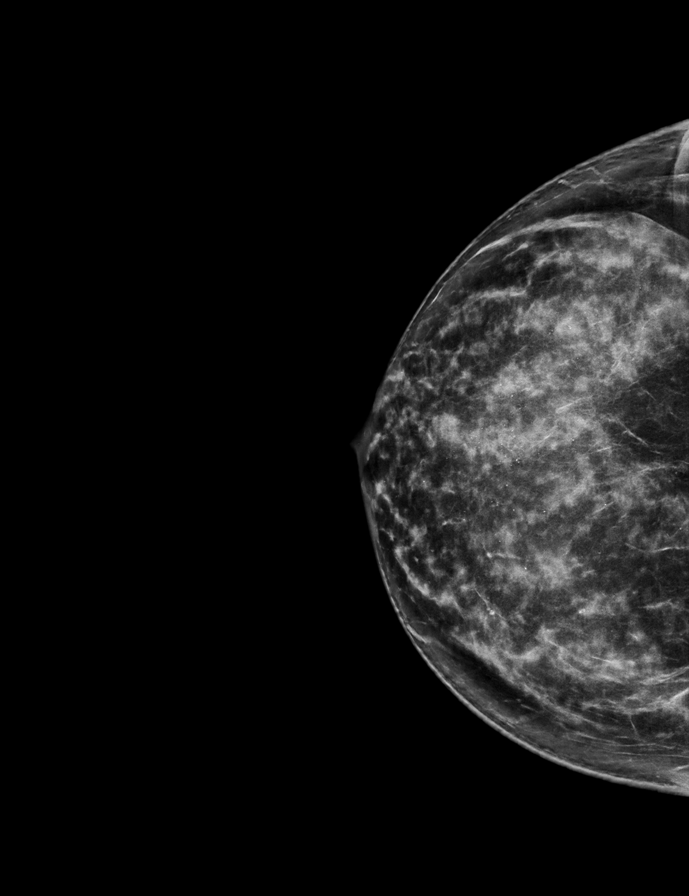

[L CC tomo · 2 of 83 frames shown]
[frame 27/83]
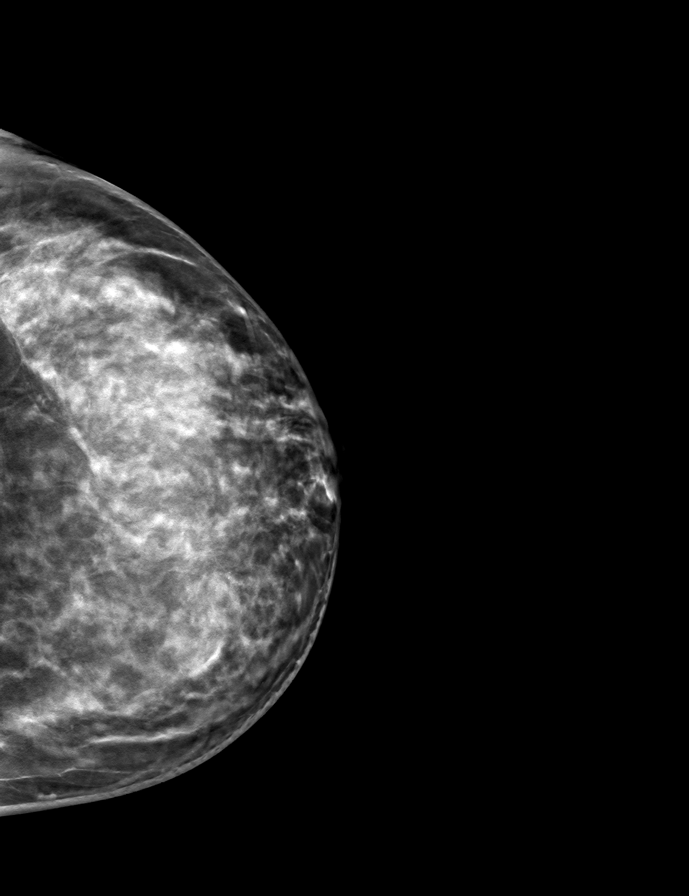
[frame 42/83]
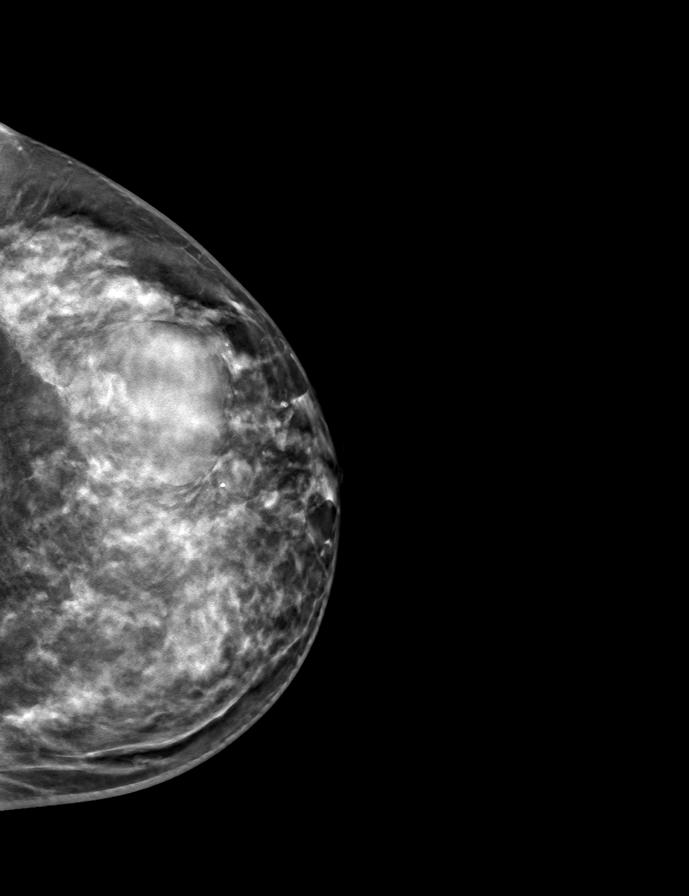

[R MLO tomo · tomo slice 43/84.0]
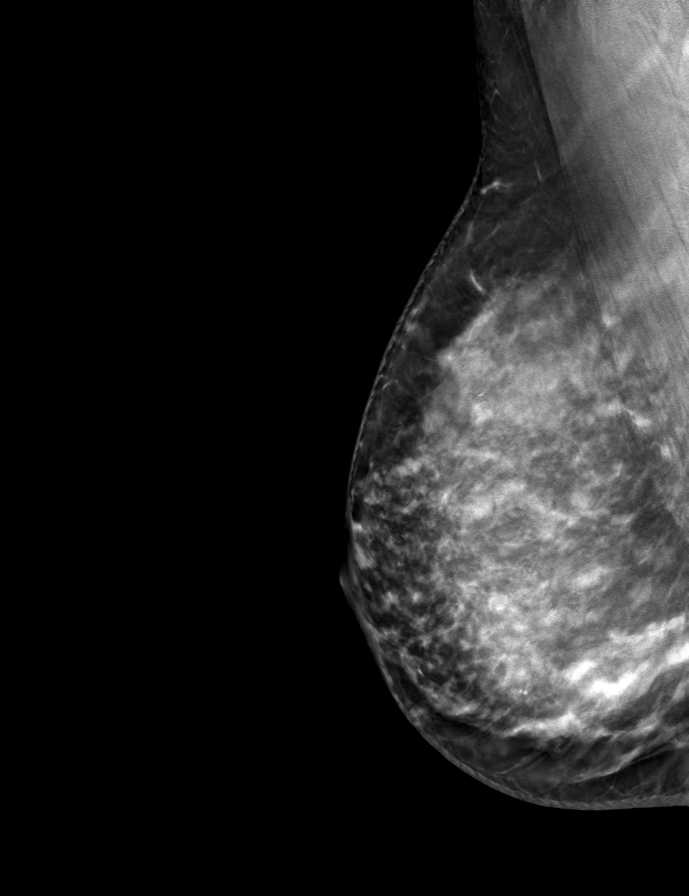

[L MLO tomo · tomo slice 45/89.0]
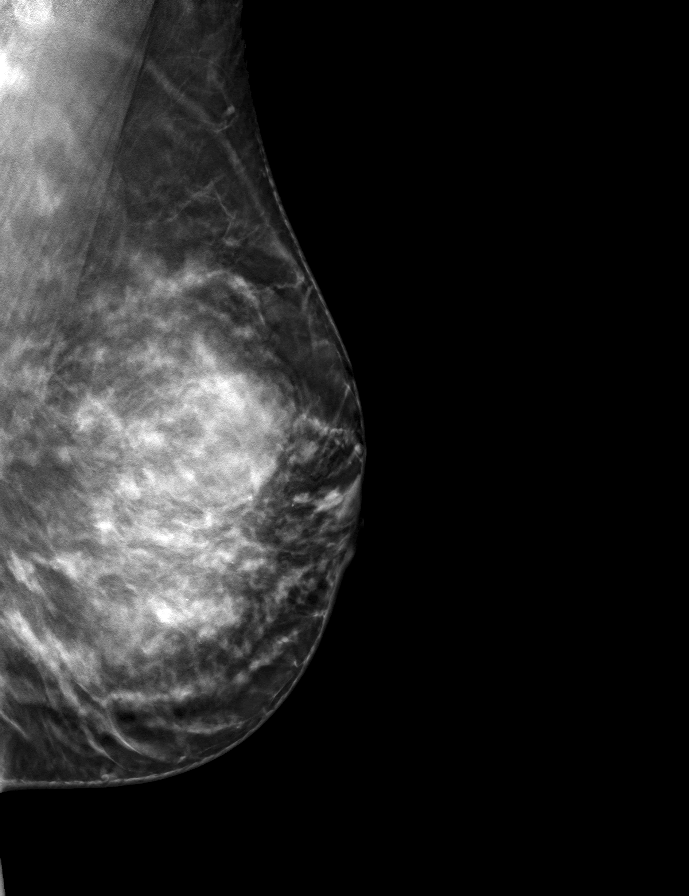

[R CC tomo · tomo slice 38/75.0]
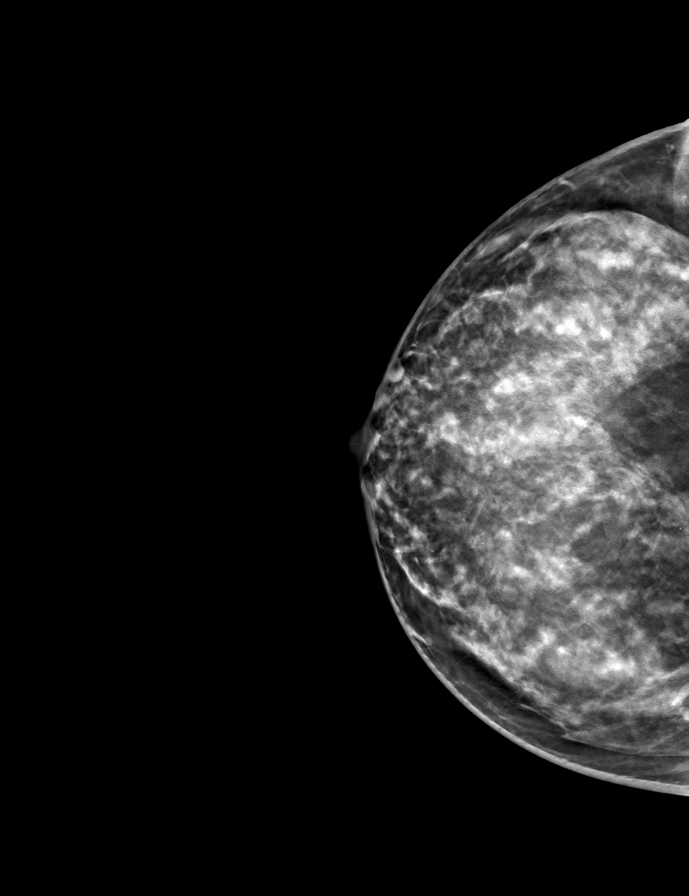

[9 of 24 positions shown; findings below may reference images not displayed]

ACR Breast Density Category c: The breast tissue is heterogeneously
dense, which may obscure small masses.
FINDINGS: There are no findings suspicious for malignancy.
IMPRESSION: No mammographic evidence of malignancy. A result letter of this
screening mammogram will be mailed directly to the patient.

RECOMMENDATION:
Screening mammogram in one year. (Code:[id number])

BI-RADS CATEGORY  1: Negative.

## 2023-08-17 ENCOUNTER — Ambulatory Visit (INDEPENDENT_AMBULATORY_CARE_PROVIDER_SITE_OTHER): Payer: Self-pay | Admitting: Emergency Medicine

## 2023-08-17 ENCOUNTER — Encounter: Payer: Self-pay | Admitting: Emergency Medicine

## 2023-08-17 VITALS — BP 110/70 | HR 63 | Temp 98.0°F | Ht 59.0 in | Wt 132.4 lb

## 2023-08-17 DIAGNOSIS — E039 Hypothyroidism, unspecified: Secondary | ICD-10-CM

## 2023-08-17 DIAGNOSIS — N951 Menopausal and female climacteric states: Secondary | ICD-10-CM

## 2023-08-17 DIAGNOSIS — L819 Disorder of pigmentation, unspecified: Secondary | ICD-10-CM

## 2023-08-17 DIAGNOSIS — I10 Essential (primary) hypertension: Secondary | ICD-10-CM

## 2023-08-17 DIAGNOSIS — E785 Hyperlipidemia, unspecified: Secondary | ICD-10-CM

## 2023-08-17 DIAGNOSIS — F418 Other specified anxiety disorders: Secondary | ICD-10-CM

## 2023-08-17 DIAGNOSIS — Z23 Encounter for immunization: Secondary | ICD-10-CM

## 2023-08-17 NOTE — Assessment & Plan Note (Signed)
Recommend dermatology evaluation Referral placed today.

## 2023-08-17 NOTE — Assessment & Plan Note (Signed)
Diet and nutrition discussed Continue atorvastatin 10 mg daily The 10-year ASCVD risk score (Arnett DK, et al., 2019) is: 0.9%   Values used to calculate the score:     Age: 47 years     Sex: Female     Is Non-Hispanic African American: No     Diabetic: No     Tobacco smoker: No     Systolic Blood Pressure: 110 mmHg     Is BP treated: Yes     HDL Cholesterol: 56.3 mg/dL     Total Cholesterol: 190 mg/dL

## 2023-08-17 NOTE — Progress Notes (Signed)
Teresa Michael 47 y.o.   Chief Complaint  Patient presents with   Medical Management of Chronic Issues    f/u appt, patient have questions pertaining to menopause, poss menopause symptoms.     HISTORY OF PRESENT ILLNESS: This is a 47 y.o. female here for 65-month follow-up Feeling much better.  Started exercising more frequently Stopped taking Lexapro 2 weeks ago Eating well. Concerned about menopause.  Started to get irregular menstrual periods.  No hot flashes or any other associated symptoms.  Needs gynecology follow-up. Also requesting referral to dermatology due to multiple skin lesions Has no complaints or any other medical concerns today  HPI   Prior to Admission medications   Medication Sig Start Date End Date Taking? Authorizing Provider  acetaminophen (TYLENOL) 500 MG tablet Take 1,000 mg by mouth every 6 (six) hours as needed for moderate pain or mild pain.   Yes [provider]  atorvastatin (LIPITOR) 10 MG tablet Take 1 tablet (10 mg total) by mouth daily. 05/17/23  Yes Chrisopher Pustejovsky, Eilleen Kempf, MD  azelastine (OPTIVAR) 0.05 % ophthalmic solution Place 1 drop into both eyes daily. 05/17/23  Yes Kiesha Ensey, Eilleen Kempf, MD  escitalopram (LEXAPRO) 10 MG tablet Take 1 tablet (10 mg total) by mouth daily. 05/17/23 11/13/23 Yes Shekita Boyden, Eilleen Kempf, MD  fluticasone [facility]) 50 MCG/ACT nasal spray Place 2 sprays into both nostrils daily. 12/04/18  Yes Peyton Najjar, MD  levothyroxine (SYNTHROID) 112 MCG tablet Take 112 mcg by mouth daily before breakfast.   Yes [provider]  losartan-hydrochlorothiazide (HYZAAR) 50-12.5 MG tablet Take 1 tablet by mouth daily. 05/17/23  Yes Ianna Salmela, Eilleen Kempf, MD  melatonin 5 MG TABS Take 5 mg by mouth at bedtime as needed (Sleep).   Yes [provider]  Multiple Vitamin (MULTIVITAMIN) tablet Take 1 tablet by mouth daily.   Yes [provider]    No Known Allergies  Patient Active Problem List    Diagnosis Date Noted   Generalized anxiety disorder 05/17/2023   History of cholecystectomy 08/16/2022   Tiredness 08/16/2022   Perimenopausal 12/03/2021   Vitamin D deficiency 12/17/2020   Absolute anemia 12/17/2020   Situational anxiety 12/17/2020   Dyslipidemia 12/17/2020   Essential hypertension 05/09/2019   Acquired hypothyroidism 10/04/2013    Past Medical History:  Diagnosis Date   Anemia    low iron in 2020   Anxiety    COVID    2020 and 2023 - both times mild symptoms   Family history of adverse reaction to anesthesia    Hyperlipidemia    Hypertension    Hypothyroidism    PONV (postoperative nausea and vomiting)    has had dizziness also   Thyroid disease     Past Surgical History:  Procedure Laterality Date   CESAREAN SECTION     2 previous   CHOLECYSTECTOMY N/A 03/22/2022   Procedure: LAPAROSCOPIC CHOLECYSTECTOMY;  Surgeon: Axel Filler, MD;  Location: [facility] OR;  Service: General;  Laterality: N/A;   WISDOM TOOTH EXTRACTION      Social History   Socioeconomic History   Marital status: Married    Spouse name: Not on file   Number of children: 2   Years of education: 12   Highest education level: High school graduate  Occupational History   Occupation: Conservator, museum/gallery  Tobacco Use   Smoking status: Never   Smokeless tobacco: Never  Vaping Use   Vaping status: Never Used  Substance and Sexual Activity   Alcohol use:  Yes    Comment: special occasions   Drug use: No   Sexual activity: Yes    Partners: Male    Birth control/protection: Condom  Other Topics Concern   Not on file  Social History Narrative   Pt. Exercises regularly   Social Determinants of Health   Financial Resource Strain: Not on file  Food Insecurity: No Food Insecurity (03/02/2022)   Hunger Vital Sign    Worried About Running Out of Food in the Last Year: Never true    Ran Out of Food in the Last Year: Never true  Transportation Needs: No Transportation Needs (03/02/2022)    PRAPARE - Administrator, Civil Service (Medical): No    Lack of Transportation (Non-Medical): No  Physical Activity: Not on file  Stress: Not on file  Social Connections: Unknown (04/13/2022)   Received from [facility], [facility]   Social Network    Social Network: Not on file  Intimate Partner Violence: Unknown (03/05/2022)   Received from [facility], [facility]   HITS    Physically Hurt: Not on file    Insult or Talk Down To: Not on file    Threaten Physical Harm: Not on file    Scream or Curse: Not on file    Family History  Problem Relation Age of Onset   Depression Mother    Hypertension Mother    Hypertension Father    Hyperlipidemia Father    Hypertension Maternal Grandfather    Depression Brother    Breast cancer Neg Hx      Review of Systems  Constitutional: Negative.  Negative for chills and fever.  HENT: Negative.  Negative for congestion and sore throat.   Respiratory: Negative.  Negative for cough and shortness of breath.   Cardiovascular: Negative.  Negative for chest pain and palpitations.  Gastrointestinal:  Negative for abdominal pain, diarrhea, nausea and vomiting.  Genitourinary: Negative.  Negative for dysuria and hematuria.  Skin: Negative.  Negative for rash.  Neurological: Negative.  Negative for dizziness and headaches.  All other systems reviewed and are negative.   Vitals:   08/17/23 1037  BP: 110/70  Pulse: 63  Temp: 98 F (36.7 C)  SpO2: 95%    Physical Exam Vitals reviewed.  Constitutional:      Appearance: Normal appearance.  HENT:     Head: Normocephalic.  Eyes:     Extraocular Movements: Extraocular movements intact.  Cardiovascular:     Rate and Rhythm: Normal rate and regular rhythm.     Pulses: Normal pulses.     Heart sounds: Normal heart sounds.  Pulmonary:     Effort: Pulmonary effort is normal.     Breath sounds: Normal breath sounds.  Musculoskeletal:     Cervical back: No tenderness.   Lymphadenopathy:     Cervical: No cervical adenopathy.  Skin:    General: Skin is warm and dry.     Comments: Multiple small hyperpigmented skin lesions all throughout  Neurological:     Mental Status: She is alert and oriented to person, place, and time.  Psychiatric:        Mood and Affect: Mood normal.        Behavior: Behavior normal.      ASSESSMENT & PLAN: A total of 33 minutes was spent with the patient and counseling/coordination of care regarding preparing for this visit, review of most recent office visit notes, review of multiple chronic medical conditions under management, review of all  medications, review of most recent blood work results, cardiovascular risks associated with hypertension and dyslipidemia, education on nutrition, prognosis, documentation, and need for follow-up.  Problem List Items Addressed This Visit       Cardiovascular and Mediastinum   Essential hypertension - Primary    BP Readings from Last 3 Encounters:  08/17/23 110/70  05/17/23 130/84  08/16/22 100/68  Troll hypertension Continue Hyzaar 50-12.5 mg daily Cardiovascular risks associated with hypertension discussed Diet and nutrition discussed         Endocrine   Acquired hypothyroidism    Clinically euthyroid Continue Synthroid 112 mcg daily Lab Results  Component Value Date   TSH 3.25 05/17/2023           Musculoskeletal and Integument   Change in multiple pigmented skin lesions    Recommend dermatology evaluation Referral placed today.      Relevant Orders   Ambulatory referral to Dermatology     Other   Situational anxiety    Much improved and no longer taking Lexapro      Dyslipidemia    Diet and nutrition discussed Continue atorvastatin 10 mg daily The 10-year ASCVD risk score (Arnett DK, et al., 2019) is: 0.9%   Values used to calculate the score:     Age: 62 years     Sex: Female     Is Non-Hispanic African American: No     Diabetic: No     Tobacco  smoker: No     Systolic Blood Pressure: 110 mmHg     Is BP treated: Yes     HDL Cholesterol: 56.3 mg/dL     Total Cholesterol: 190 mg/dL       Perimenopausal    No vasomotor symptoms yet Menstrual period starting to get irregular Has some vaginal dryness and decreased libido Recommend follow-up with her gynecologist      Other Visit Diagnoses     Need for vaccination       Relevant Orders   Flu vaccine trivalent PF, 6mos and older(Flulaval,Afluria,Fluarix,Fluzone) (Completed)      Patient Instructions  Mantenimiento de la salud en las mujeres Health Maintenance, Female Adoptar un estilo de vida saludable y recibir atencin preventiva son importantes para promover la salud y Counsellor. Consulte al mdico sobre: El esquema adecuado para hacerse pruebas y exmenes peridicos. Cosas que puede hacer por su cuenta para prevenir enfermedades y [place] sano. Qu debo saber sobre la dieta, el peso y el ejercicio? Consuma una dieta saludable  Consuma una dieta que incluya muchas verduras, frutas, productos lcteos con bajo contenido de [place] y Associate Professor. No consuma muchos alimentos ricos en grasas slidas, azcares agregados o sodio. Mantenga un peso saludable El ndice de masa muscular [facility]) se [place] para identificar problemas de [place]. Proporciona una estimacin de la grasa corporal basndose en el peso y la altura. Su mdico puede ayudarle a Engineer, site IMC y a Personnel officer o Pharmacologist un peso saludable. Haga ejercicio con regularidad Haga ejercicio con regularidad. Esta es una de las prcticas ms importantes que puede hacer por su salud. La [employer] de los adultos deben seguir estas pautas: Education officer, environmental, al menos, 150 minutos de actividad fsica por semana. El ejercicio debe aumentar la frecuencia cardaca y Media planner transpirar (ejercicio de intensidad moderada). Hacer ejercicios de fortalecimiento por lo [employer] por semana. Agregue esto a su plan de ejercicio de  intensidad moderada. Pase menos tiempo sentada. Incluso la actividad fsica ligera puede ser beneficiosa. Controle sus niveles de colesterol  y lpidos en la sangre Comience a realizarse anlisis de lpidos y Oncologist en la sangre a los 20 aos y luego reptalos cada 5 aos. Hgase controlar los niveles de colesterol con mayor frecuencia si: Sus niveles de lpidos y colesterol son altos. Es mayor de 40 aos. Presenta un alto riesgo de padecer enfermedades cardacas. Qu debo saber sobre las pruebas de deteccin del cncer? Segn su historia clnica y sus antecedentes familiares, es posible que deba realizarse pruebas de deteccin del cncer en diferentes edades. Esto puede incluir pruebas de deteccin de lo siguiente: Cncer de mama. Cncer de cuello uterino. Cncer colorrectal. Cncer de piel. Cncer de pulmn. Qu debo saber sobre la enfermedad cardaca, la diabetes y la hipertensin arterial? Presin arterial y enfermedad cardaca La hipertensin arterial causa enfermedades cardacas y [place] el riesgo de accidente cerebrovascular. Es ms probable que esto se manifieste en las personas que tienen lecturas de presin arterial alta o tienen sobrepeso. Hgase controlar la presin arterial: Cada 3 a 5 aos si tiene entre 18 y 42 aos. Todos los aos si es mayor de 40 aos. Diabetes Realcese exmenes de deteccin de la diabetes con regularidad. Este anlisis revisa el nivel de azcar en la sangre en [place]. Hgase las pruebas de deteccin: Cada tres aos despus de los 40 aos de edad si tiene un peso normal y un bajo riesgo de padecer diabetes. Con ms frecuencia y a partir de [place] edad inferior si tiene sobrepeso o un alto riesgo de padecer diabetes. Qu debo saber sobre la prevencin de infecciones? Hepatitis B Si tiene un riesgo ms alto de contraer hepatitis B, debe someterse a un examen de deteccin de este virus. Hable con el mdico para averiguar si tiene riesgo de contraer la  infeccin por hepatitis B. Hepatitis C Se recomienda el anlisis a: [employer] 1945 y 1965. Todas las personas que tengan un riesgo de haber contrado hepatitis C. Enfermedades de transmisin sexual (ETS) Hgase las pruebas de Airline pilot de ITS, incluidas la gonorrea y la clamidia, si: Es sexualmente activa y es menor de [address]. Es mayor de [address], y Public affairs consultant informa que corre riesgo de tener este tipo de infecciones. La actividad sexual ha cambiado desde que le hicieron la ltima prueba de deteccin y tiene un riesgo mayor de Warehouse manager clamidia o Copy. Pregntele al mdico si usted tiene riesgo. Pregntele al mdico si usted tiene un alto riesgo de Primary school teacher VIH. El mdico tambin puede recomendarle un medicamento recetado para ayudar a evitar la infeccin por el VIH. Si elige tomar medicamentos para prevenir el VIH, primero debe [employer] de deteccin del VIH. Luego debe hacerse anlisis cada 3 meses mientras est tomando los medicamentos. Embarazo Si est por dejar de Armed forces training and education officer (fase premenopusica) y usted puede quedar [place], busque asesoramiento antes de [place]. Tome de 400 a 800 microgramos (mcg) de cido [employer] si [place]. Pida mtodos de control de la natalidad (anticonceptivos) si desea evitar un embarazo no deseado. Osteoporosis y [place] La osteoporosis es una enfermedad en la que los huesos pierden los minerales y la fuerza por el avance de la edad. El resultado pueden ser fracturas en los [place]. Si tiene 65 aos o ms, o si est en riesgo de sufrir osteoporosis y fracturas, pregunte a su mdico si debe: Hacerse pruebas de deteccin de prdida sea. Tomar un suplemento de calcio o de vitamina D para reducir el riesgo de fracturas. Recibir terapia de reemplazo  hormonal (TRH) para tratar los sntomas de la menopausia. Siga estas indicaciones en su casa: Consumo de alcohol No beba alcohol si: Su mdico le  indica no hacerlo. Est embarazada, puede estar embarazada o est tratando de [place]. Si bebe alcohol: Limite la cantidad que bebe a lo siguiente: De 0 a 1 bebida por da. Sepa cunta cantidad de alcohol hay en las bebidas que toma. En los [address], una medida equivale a una botella de cerveza de 12 oz (355 ml), un vaso de vino de 5 oz (148 ml) o un vaso de una bebida alcohlica de alta graduacin de 1 oz (44 ml). Estilo de vida No consuma ningn producto que contenga nicotina o tabaco. Estos productos incluyen cigarrillos, tabaco para Theatre manager y aparatos de vapeo, como los Administrator, Civil Service. Si necesita ayuda para dejar de consumir estos productos, consulte al mdico. No consuma drogas. No comparta agujas. Solicite ayuda a su mdico si necesita apoyo o informacin para abandonar las drogas. Indicaciones generales Realcese los estudios de rutina de [address], dentales y de Wellsite geologist. Mantngase al da con las vacunas. Infrmele a su mdico si: Se siente deprimida con frecuencia. Alguna vez ha sido vctima de [place] o no se siente seguro en su casa. Resumen Adoptar un estilo de vida saludable y recibir atencin preventiva son importantes para promover la salud y Counsellor. Siga las instrucciones del mdico acerca de una dieta saludable, el ejercicio y la realizacin de pruebas o exmenes para Hotel manager. Siga las instrucciones del mdico con respecto al control del colesterol y la presin arterial. Esta informacin no tiene Theme park manager el consejo del mdico. Asegrese de hacerle al mdico cualquier pregunta que tenga. Document Revised: 04/23/2021 Document Reviewed: 04/23/2021 Elsevier Patient Education  2024 Elsevier Inc.     Edwina Barth, MD [facility]

## 2023-08-17 NOTE — Assessment & Plan Note (Signed)
No vasomotor symptoms yet Menstrual period starting to get irregular Has some vaginal dryness and decreased libido Recommend follow-up with her gynecologist

## 2023-08-17 NOTE — Assessment & Plan Note (Signed)
Clinically euthyroid Continue Synthroid 112 mcg daily Lab Results  Component Value Date   TSH 3.25 05/17/2023

## 2023-08-17 NOTE — Assessment & Plan Note (Signed)
Much improved and no longer taking Lexapro

## 2023-08-17 NOTE — Patient Instructions (Signed)
Mantenimiento de Radiographer, therapeutic en las mujeres Health Maintenance, Female Adoptar un estilo de vida saludable y recibir atencin preventiva son importantes para promover la salud y Counsellor. Consulte al mdico sobre: El esquema adecuado para hacerse pruebas y exmenes peridicos. Cosas que puede hacer por su cuenta para prevenir enfermedades y [place] sano. Qu debo saber sobre la dieta, el peso y el ejercicio? Consuma una dieta saludable  Consuma una dieta que incluya muchas verduras, frutas, productos lcteos con bajo contenido de [place] y Associate Professor. No consuma muchos alimentos ricos en grasas slidas, azcares agregados o sodio. Mantenga un peso saludable El ndice de masa muscular [facility]) se [place] para identificar problemas de [place]. Proporciona una estimacin de la grasa corporal basndose en el peso y la altura. Su mdico puede ayudarle a Engineer, site IMC y a Personnel officer o Pharmacologist un peso saludable. Haga ejercicio con regularidad Haga ejercicio con regularidad. Esta es una de las prcticas ms importantes que puede hacer por su salud. La [employer] de los adultos deben seguir estas pautas: Education officer, environmental, al menos, 150 minutos de actividad fsica por semana. El ejercicio debe aumentar la frecuencia cardaca y Media planner transpirar (ejercicio de intensidad moderada). Hacer ejercicios de fortalecimiento por lo [employer] por semana. Agregue esto a su plan de ejercicio de intensidad moderada. Pase menos tiempo sentada. Incluso la actividad fsica ligera puede ser beneficiosa. Controle sus niveles de colesterol y lpidos en la sangre Comience a realizarse anlisis de lpidos y Oncologist en la sangre a los 20 aos y luego reptalos cada 5 aos. Hgase controlar los niveles de colesterol con mayor frecuencia si: Sus niveles de lpidos y colesterol son altos. Es mayor de 40 aos. Presenta un alto riesgo de padecer enfermedades cardacas. Qu debo saber sobre las pruebas de deteccin del  cncer? Segn su historia clnica y sus antecedentes familiares, es posible que deba realizarse pruebas de deteccin del cncer en diferentes edades. Esto puede incluir pruebas de deteccin de lo siguiente: Cncer de mama. Cncer de cuello uterino. Cncer colorrectal. Cncer de piel. Cncer de pulmn. Qu debo saber sobre la enfermedad cardaca, la diabetes y la hipertensin arterial? Presin arterial y enfermedad cardaca La hipertensin arterial causa enfermedades cardacas y [place] el riesgo de accidente cerebrovascular. Es ms probable que esto se manifieste en las personas que tienen lecturas de presin arterial alta o tienen sobrepeso. Hgase controlar la presin arterial: Cada 3 a 5 aos si tiene entre 18 y 65 aos. Todos los aos si es mayor de 40 aos. Diabetes Realcese exmenes de deteccin de la diabetes con regularidad. Este anlisis revisa el nivel de azcar en la sangre en [place]. Hgase las pruebas de deteccin: Cada tres aos despus de los 40 aos de edad si tiene un peso normal y un bajo riesgo de padecer diabetes. Con ms frecuencia y a partir de [place] edad inferior si tiene sobrepeso o un alto riesgo de padecer diabetes. Qu debo saber sobre la prevencin de infecciones? Hepatitis B Si tiene un riesgo ms alto de contraer hepatitis B, debe someterse a un examen de deteccin de este virus. Hable con el mdico para averiguar si tiene riesgo de contraer la infeccin por hepatitis B. Hepatitis C Se recomienda el anlisis a: [employer] 1945 y 1965. Todas las personas que tengan un riesgo de haber contrado hepatitis C. Enfermedades de transmisin sexual (ETS) Hgase las pruebas de Airline pilot de ITS, incluidas la gonorrea y la clamidia, si: Es sexualmente activa y es Adult nurse de 24  aos. Es mayor de 24 aos, y el mdico le informa que corre riesgo de tener este tipo de infecciones. La actividad sexual ha cambiado desde que le hicieron la ltima prueba de  deteccin y tiene un riesgo mayor de Warehouse manager clamidia o Copy. Pregntele al mdico si usted tiene riesgo. Pregntele al mdico si usted tiene un alto riesgo de Primary school teacher VIH. El mdico tambin puede recomendarle un medicamento recetado para ayudar a evitar la infeccin por el VIH. Si elige tomar medicamentos para prevenir el VIH, primero debe [employer] de deteccin del VIH. Luego debe hacerse anlisis cada 3 meses mientras est tomando los medicamentos. Embarazo Si est por dejar de Armed forces training and education officer (fase premenopusica) y usted puede quedar [place], busque asesoramiento antes de [place]. Tome de 400 a 800 microgramos (mcg) de cido [employer] si [place]. Pida mtodos de control de la natalidad (anticonceptivos) si desea evitar un embarazo no deseado. Osteoporosis y [place] La osteoporosis es una enfermedad en la que los huesos pierden los minerales y la fuerza por el avance de la edad. El resultado pueden ser fracturas en los [place]. Si tiene 65 aos o ms, o si est en riesgo de sufrir osteoporosis y fracturas, pregunte a su mdico si debe: Hacerse pruebas de deteccin de prdida sea. Tomar un suplemento de calcio o de vitamina D para reducir el riesgo de fracturas. Recibir terapia de reemplazo hormonal (TRH) para tratar los sntomas de la menopausia. Siga estas indicaciones en su casa: Consumo de alcohol No beba alcohol si: Su mdico le indica no hacerlo. Est embarazada, puede estar embarazada o est tratando de [place]. Si bebe alcohol: Limite la cantidad que bebe a lo siguiente: De 0 a 1 bebida por da. Sepa cunta cantidad de alcohol hay en las bebidas que toma. En los [address], una medida equivale a una botella de cerveza de 12 oz (355 ml), un vaso de vino de 5 oz (148 ml) o un vaso de una bebida alcohlica de alta graduacin de 1 oz (44 ml). Estilo de vida No consuma ningn producto que contenga nicotina o tabaco. Estos  productos incluyen cigarrillos, tabaco para Theatre manager y aparatos de vapeo, como los Administrator, Civil Service. Si necesita ayuda para dejar de consumir estos productos, consulte al mdico. No consuma drogas. No comparta agujas. Solicite ayuda a su mdico si necesita apoyo o informacin para abandonar las drogas. Indicaciones generales Realcese los estudios de rutina de [address], dentales y de Wellsite geologist. Mantngase al da con las vacunas. Infrmele a su mdico si: Se siente deprimida con frecuencia. Alguna vez ha sido vctima de [place] o no se siente seguro en su casa. Resumen Adoptar un estilo de vida saludable y recibir atencin preventiva son importantes para promover la salud y Counsellor. Siga las instrucciones del mdico acerca de una dieta saludable, el ejercicio y la realizacin de pruebas o exmenes para Hotel manager. Siga las instrucciones del mdico con respecto al control del colesterol y la presin arterial. Esta informacin no tiene Theme park manager el consejo del mdico. Asegrese de hacerle al mdico cualquier pregunta que tenga. Document Revised: 04/23/2021 Document Reviewed: 04/23/2021 Elsevier Patient Education  2024 [employer].

## 2023-08-17 NOTE — Assessment & Plan Note (Signed)
BP Readings from Last 3 Encounters:  08/17/23 110/70  05/17/23 130/84  08/16/22 100/68  Troll hypertension Continue Hyzaar 50-12.5 mg daily Cardiovascular risks associated with hypertension discussed Diet and nutrition discussed

## 2023-11-18 ENCOUNTER — Other Ambulatory Visit: Payer: Self-pay | Admitting: Emergency Medicine

## 2023-11-18 DIAGNOSIS — F411 Generalized anxiety disorder: Secondary | ICD-10-CM

## 2024-01-04 ENCOUNTER — Other Ambulatory Visit: Payer: Self-pay | Admitting: Emergency Medicine

## 2024-01-24 ENCOUNTER — Other Ambulatory Visit: Payer: Self-pay | Admitting: Obstetrics and Gynecology

## 2024-01-24 DIAGNOSIS — Z1231 Encounter for screening mammogram for malignant neoplasm of breast: Secondary | ICD-10-CM

## 2024-02-16 ENCOUNTER — Encounter: Payer: Self-pay | Admitting: Emergency Medicine

## 2024-02-16 ENCOUNTER — Ambulatory Visit (INDEPENDENT_AMBULATORY_CARE_PROVIDER_SITE_OTHER): Payer: Self-pay | Admitting: Emergency Medicine

## 2024-02-16 VITALS — BP 122/88 | HR 81 | Temp 98.5°F | Ht 59.0 in | Wt 132.0 lb

## 2024-02-16 DIAGNOSIS — I10 Essential (primary) hypertension: Secondary | ICD-10-CM

## 2024-02-16 DIAGNOSIS — E039 Hypothyroidism, unspecified: Secondary | ICD-10-CM

## 2024-02-16 DIAGNOSIS — F411 Generalized anxiety disorder: Secondary | ICD-10-CM

## 2024-02-16 DIAGNOSIS — E785 Hyperlipidemia, unspecified: Secondary | ICD-10-CM

## 2024-02-16 LAB — CBC WITH DIFFERENTIAL/PLATELET
Basophils Absolute: 0 10*3/uL (ref 0.0–0.1)
Basophils Relative: 0.5 % (ref 0.0–3.0)
Eosinophils Absolute: 0 10*3/uL (ref 0.0–0.7)
Eosinophils Relative: 0 % (ref 0.0–5.0)
HCT: 43.1 % (ref 36.0–46.0)
Hemoglobin: 14 g/dL (ref 12.0–15.0)
Lymphocytes Relative: 23 % (ref 12.0–46.0)
Lymphs Abs: 1.3 10*3/uL (ref 0.7–4.0)
MCHC: 32.5 g/dL (ref 30.0–36.0)
MCV: 76.2 fl — ABNORMAL LOW (ref 78.0–100.0)
Monocytes Absolute: 0.5 10*3/uL (ref 0.1–1.0)
Monocytes Relative: 8.3 % (ref 3.0–12.0)
Neutro Abs: 3.7 10*3/uL (ref 1.4–7.7)
Neutrophils Relative %: 68.2 % (ref 43.0–77.0)
Platelets: 334 10*3/uL (ref 150.0–400.0)
RBC: 5.66 Mil/uL — ABNORMAL HIGH (ref 3.87–5.11)
RDW: 16.1 % — ABNORMAL HIGH (ref 11.5–15.5)
WBC: 5.4 10*3/uL (ref 4.0–10.5)

## 2024-02-16 LAB — LIPID PANEL
Cholesterol: 162 mg/dL (ref 0–200)
HDL: 60 mg/dL (ref >39.00)
LDL Cholesterol: 66 mg/dL (ref 0–99)
NonHDL: 102.02
Total CHOL/HDL Ratio: 3
Triglycerides: 179 mg/dL — ABNORMAL HIGH (ref 0.0–149.0)
VLDL: 35.8 mg/dL (ref 0.0–40.0)

## 2024-02-16 LAB — COMPREHENSIVE METABOLIC PANEL
ALT: 38 U/L — ABNORMAL HIGH (ref 0–35)
AST: 26 U/L (ref 0–37)
Albumin: 4.9 g/dL (ref 3.5–5.2)
Alkaline Phosphatase: 91 U/L (ref 39–117)
BUN: 10 mg/dL (ref 6–23)
CO2: 31 meq/L (ref 19–32)
Calcium: 10.1 mg/dL (ref 8.4–10.5)
Chloride: 99 meq/L (ref 96–112)
Creatinine, Ser: 0.81 mg/dL (ref 0.40–1.20)
GFR: 85.6 mL/min (ref >60.00)
Glucose, Bld: 96 mg/dL (ref 70–99)
Potassium: 4 meq/L (ref 3.5–5.1)
Sodium: 140 meq/L (ref 135–145)
Total Bilirubin: 0.5 mg/dL (ref 0.2–1.2)
Total Protein: 8 g/dL (ref 6.0–8.3)

## 2024-02-16 LAB — HEMOGLOBIN A1C: Hgb A1c MFr Bld: 6.2 % (ref 4.6–6.5)

## 2024-02-16 LAB — TSH: TSH: 0.34 u[IU]/mL — ABNORMAL LOW (ref 0.35–5.50)

## 2024-02-16 NOTE — Patient Instructions (Signed)
 Mantenimiento de Radiographer, therapeutic en las mujeres Health Maintenance, Female Adoptar un estilo de vida saludable y recibir atencin preventiva son importantes para promover la salud y Counsellor. Consulte al mdico sobre: El esquema adecuado para hacerse pruebas y exmenes peridicos. Cosas que puede hacer por su cuenta para prevenir enfermedades y [place] sano. Qu debo saber sobre la dieta, el peso y el ejercicio? Consuma una dieta saludable  Consuma una dieta que incluya muchas verduras, frutas, productos lcteos con bajo contenido de [place] y Associate Professor. No consuma muchos alimentos ricos en grasas slidas, azcares agregados o sodio. Mantenga un peso saludable El ndice de masa muscular [facility]) se [place] para identificar problemas de [place]. Proporciona una estimacin de la grasa corporal basndose en el peso y la altura. Su mdico puede ayudarle a Engineer, site IMC y a Personnel officer o Pharmacologist un peso saludable. Haga ejercicio con regularidad Haga ejercicio con regularidad. Esta es una de las prcticas ms importantes que puede hacer por su salud. La [employer] de los adultos deben seguir estas pautas: Education officer, environmental, al menos, 150 minutos de actividad fsica por semana. El ejercicio debe aumentar la frecuencia cardaca y Media planner transpirar (ejercicio de intensidad moderada). Hacer ejercicios de fortalecimiento por lo [facility] por semana. Agregue esto a su plan de ejercicio de intensidad moderada. Pase menos tiempo sentada. Incluso la actividad fsica ligera puede ser beneficiosa. Controle sus niveles de colesterol y lpidos en la sangre Comience a realizarse anlisis de lpidos y Oncologist en la sangre a los 20 aos y luego reptalos cada 5 aos. Hgase controlar los niveles de colesterol con mayor frecuencia si: Sus niveles de lpidos y colesterol son altos. Es mayor de 40 aos. Presenta un alto riesgo de padecer enfermedades cardacas. Qu debo saber sobre las pruebas de deteccin del  cncer? Segn su historia clnica y sus antecedentes familiares, es posible que deba realizarse pruebas de deteccin del cncer en diferentes edades. Esto puede incluir pruebas de deteccin de lo siguiente: Cncer de mama. Cncer de cuello uterino. Cncer colorrectal. Cncer de piel. Cncer de pulmn. Qu debo saber sobre la enfermedad cardaca, la diabetes y la hipertensin arterial? Presin arterial y enfermedad cardaca La hipertensin arterial causa enfermedades cardacas y [place] el riesgo de accidente cerebrovascular. Es ms probable que esto se manifieste en las personas que tienen lecturas de presin arterial alta o tienen sobrepeso. Hgase controlar la presin arterial: Cada 3 a 5 aos si tiene entre 18 y 50 aos. Todos los aos si es mayor de 40 aos. Diabetes Realcese exmenes de deteccin de la diabetes con regularidad. Este anlisis revisa el nivel de azcar en la sangre en [place]. Hgase las pruebas de deteccin: Cada tres aos despus de los 40 aos de edad si tiene un peso normal y un bajo riesgo de padecer diabetes. Con ms frecuencia y a partir de [place] edad inferior si tiene sobrepeso o un alto riesgo de padecer diabetes. Qu debo saber sobre la prevencin de infecciones? Hepatitis B Si tiene un riesgo ms alto de contraer hepatitis B, debe someterse a un examen de deteccin de este virus. Hable con el mdico para averiguar si tiene riesgo de contraer la infeccin por hepatitis B. Hepatitis C Se recomienda el anlisis a: [employer] 1945 y 1965. Todas las personas que tengan un riesgo de haber contrado hepatitis C. Enfermedades de transmisin sexual (ETS) Hgase las pruebas de Airline pilot de ITS, incluidas la gonorrea y la clamidia, si: Es sexualmente activa y es Adult nurse de 24  aos. Es mayor de 24 aos, y el mdico le informa que corre riesgo de tener este tipo de infecciones. La actividad sexual ha cambiado desde que le hicieron la ltima prueba de  deteccin y tiene un riesgo mayor de Warehouse manager clamidia o Copy. Pregntele al mdico si usted tiene riesgo. Pregntele al mdico si usted tiene un alto riesgo de Primary school teacher VIH. El mdico tambin puede recomendarle un medicamento recetado para ayudar a evitar la infeccin por el VIH. Si elige tomar medicamentos para prevenir el VIH, primero debe [employer] de deteccin del VIH. Luego debe hacerse anlisis cada 3 meses mientras est tomando los medicamentos. Embarazo Si est por dejar de Armed forces training and education officer (fase premenopusica) y usted puede quedar [place], busque asesoramiento antes de [place]. Tome de 400 a 800 microgramos (mcg) de cido [employer] si [place]. Pida mtodos de control de la natalidad (anticonceptivos) si desea evitar un embarazo no deseado. Osteoporosis y [place] La osteoporosis es una enfermedad en la que los huesos pierden los minerales y la fuerza por el avance de la edad. El resultado pueden ser fracturas en los [place]. Si tiene 65 aos o ms, o si est en riesgo de sufrir osteoporosis y fracturas, pregunte a su mdico si debe: Hacerse pruebas de deteccin de prdida sea. Tomar un suplemento de calcio o de vitamina D para reducir el riesgo de fracturas. Recibir terapia de reemplazo hormonal (TRH) para tratar los sntomas de la menopausia. Siga estas indicaciones en su casa: Consumo de alcohol No beba alcohol si: Su mdico le indica no hacerlo. Est embarazada, puede estar embarazada o est tratando de [place]. Si bebe alcohol: Limite la cantidad que bebe a lo siguiente: De 0 a 1 bebida por da. Sepa cunta cantidad de alcohol hay en las bebidas que toma. En los [address], una medida equivale a una botella de cerveza de 12 oz (355 ml), un vaso de vino de 5 oz (148 ml) o un vaso de una bebida alcohlica de alta graduacin de 1 oz (44 ml). Estilo de vida No consuma ningn producto que contenga nicotina o tabaco. Estos  productos incluyen cigarrillos, tabaco para Theatre manager y aparatos de vapeo, como los Administrator, Civil Service. Si necesita ayuda para dejar de consumir estos productos, consulte al mdico. No consuma drogas. No comparta agujas. Solicite ayuda a su mdico si necesita apoyo o informacin para abandonar las drogas. Indicaciones generales Realcese los estudios de rutina de [address], dentales y de Wellsite geologist. Mantngase al da con las vacunas. Infrmele a su mdico si: Se siente deprimida con frecuencia. Alguna vez ha sido vctima de [place] o no se siente seguro en su casa. Resumen Adoptar un estilo de vida saludable y recibir atencin preventiva son importantes para promover la salud y Counsellor. Siga las instrucciones del mdico acerca de una dieta saludable, el ejercicio y la realizacin de pruebas o exmenes para Hotel manager. Siga las instrucciones del mdico con respecto al control del colesterol y la presin arterial. Esta informacin no tiene Theme park manager el consejo del mdico. Asegrese de hacerle al mdico cualquier pregunta que tenga. Document Revised: 04/23/2021 Document Reviewed: 04/23/2021 Elsevier Patient Education  2024 [employer].

## 2024-02-16 NOTE — Progress Notes (Signed)
 Teresa Michael 48 y.o.   Chief Complaint  Patient presents with   Follow-up    6 month f/u for HTN. Patient states she noticed for the past 3-4 months she noticed her face being swollen.     HISTORY OF PRESENT ILLNESS: This is a 48 y.o. female here for 53-month follow-up of chronic medical conditions including hypertension Overall doing well.  Has noticed some minor facial swelling over the past several months No other complaints or medical concerns today.  HPI   Prior to Admission medications   Medication Sig Start Date End Date Taking? Authorizing Provider  acetaminophen (TYLENOL) 500 MG tablet Take 1,000 mg by mouth every 6 (six) hours as needed for moderate pain or mild pain.   Yes [provider]  atorvastatin (LIPITOR) 10 MG tablet Take 1 tablet (10 mg total) by mouth daily. 05/17/23  Yes Averi Kilty, Eilleen Kempf, MD  azelastine (OPTIVAR) 0.05 % ophthalmic solution INSTILL 1 DROP INTO AFFECTED EYE INTO [facility] EYE ONCE DAILY 01/04/24  Yes Georgina Quint, MD  escitalopram (LEXAPRO) 10 MG tablet Take 1 tablet by mouth once daily 11/18/23  Yes Xin Klawitter, Eilleen Kempf, MD  fluticasone [facility]) 50 MCG/ACT nasal spray Place 2 sprays into both nostrils daily. 12/04/18  Yes Peyton Najjar, MD  levothyroxine (SYNTHROID) 112 MCG tablet Take 112 mcg by mouth daily before breakfast.   Yes [provider]  losartan-hydrochlorothiazide (HYZAAR) 50-12.5 MG tablet Take 1 tablet by mouth daily. 05/17/23  Yes Ridhima Golberg, Eilleen Kempf, MD  melatonin 5 MG TABS Take 5 mg by mouth at bedtime as needed (Sleep).   Yes [provider]  Multiple Vitamin (MULTIVITAMIN) tablet Take 1 tablet by mouth daily.   Yes [provider]    No Known Allergies  Patient Active Problem List   Diagnosis Date Noted   Change in multiple pigmented skin lesions 08/17/2023   Generalized anxiety disorder 05/17/2023   History of cholecystectomy 08/16/2022   Tiredness 08/16/2022    Perimenopausal 12/03/2021   Vitamin D deficiency 12/17/2020   Absolute anemia 12/17/2020   Situational anxiety 12/17/2020   Dyslipidemia 12/17/2020   Essential hypertension 05/09/2019   Acquired hypothyroidism 10/04/2013    Past Medical History:  Diagnosis Date   Anemia    low iron in 2020   Anxiety    COVID    2020 and 2023 - both times mild symptoms   Family history of adverse reaction to anesthesia    Hyperlipidemia    Hypertension    Hypothyroidism    PONV (postoperative nausea and vomiting)    has had dizziness also   Thyroid disease     Past Surgical History:  Procedure Laterality Date   CESAREAN SECTION     2 previous   CHOLECYSTECTOMY N/A 03/22/2022   Procedure: LAPAROSCOPIC CHOLECYSTECTOMY;  Surgeon: Axel Filler, MD;  Location: [facility];  Service: General;  Laterality: N/A;   WISDOM TOOTH EXTRACTION      Social History   Socioeconomic History   Marital status: Married    Spouse name: Not on file   Number of children: 2   Years of education: 12   Highest education level: High school graduate  Occupational History   Occupation: Conservator, museum/gallery  Tobacco Use   Smoking status: Never   Smokeless tobacco: Never  Vaping Use   Vaping status: Never Used  Substance and Sexual Activity   Alcohol use: Yes    Comment: special occasions   Drug use: No   Sexual  activity: Yes    Partners: Male    Birth control/protection: Condom  Other Topics Concern   Not on file  Social History Narrative   Pt. Exercises regularly   Social Drivers of Health   Financial Resource Strain: Not on file  Food Insecurity: Low Risk  (12/05/2023)   Received from [facility]   Hunger Vital Sign    Worried About Running Out of Food in the Last Year: Never true    Ran Out of Food in the Last Year: Never true  Transportation Needs: No Transportation Needs (12/05/2023)   Received from [facility]    In the past 12 months, has lack of reliable transportation kept you  from medical appointments, meetings, work or from getting things needed for daily living? : No  Physical Activity: Not on file  Stress: Not on file  Social Connections: Unknown (04/13/2022)   [facility] from [facility]   Social Network    Social Network: Not on file  Intimate Partner Violence: Unknown (03/05/2022)   Received from [facility]   HITS    Physically Hurt: Not on file    Insult or Talk Down To: Not on file    Threaten Physical Harm: Not on file    Scream or Curse: Not on file    Family History  Problem Relation Age of Onset   Depression Mother    Hypertension Mother    Hypertension Father    Hyperlipidemia Father    Hypertension Maternal Grandfather    Depression Brother    Breast cancer Neg Hx      Review of Systems  Constitutional: Negative.  Negative for chills and fever.  HENT: Negative.  Negative for congestion and sore throat.   Respiratory: Negative.  Negative for cough and shortness of breath.   Cardiovascular: Negative.  Negative for chest pain and palpitations.  Gastrointestinal:  Negative for abdominal pain, diarrhea, nausea and vomiting.  Genitourinary: Negative.  Negative for dysuria and hematuria.  Skin: Negative.  Negative for rash.  Neurological: Negative.  Negative for dizziness and headaches.  All other systems reviewed and are negative.   Vitals:   02/16/24 1047  BP: 122/88  Pulse: 81  Temp: 98.5 F (36.9 C)  SpO2: 98%    Physical Exam Vitals reviewed.  Constitutional:      Appearance: Normal appearance.  HENT:     Head: Normocephalic.     Mouth/Throat:     Mouth: Mucous membranes are moist.     Pharynx: Oropharynx is clear.  Eyes:     Extraocular Movements: Extraocular movements intact.     Conjunctiva/sclera: Conjunctivae normal.     Pupils: Pupils are equal, round, and reactive to light.  Cardiovascular:     Rate and Rhythm: Normal rate and regular rhythm.     Pulses: Normal pulses.      Heart sounds: Normal heart sounds.  Pulmonary:     Effort: Pulmonary effort is normal.     Breath sounds: Normal breath sounds.  Musculoskeletal:     Cervical back: No tenderness.  Lymphadenopathy:     Cervical: No cervical adenopathy.  Skin:    General: Skin is warm and dry.     Capillary Refill: Capillary refill takes less than 2 seconds.  Neurological:     General: No focal deficit present.     Mental Status: She is alert and oriented to person, place, and time.  Psychiatric:  Mood and Affect: Mood normal.        Behavior: Behavior normal.      ASSESSMENT & PLAN:  Problem List Items Addressed This Visit       Cardiovascular and Mediastinum   Essential hypertension - Primary   BP Readings from Last 3 Encounters:  02/16/24 122/88  08/17/23 110/70  05/17/23 130/84  Well-controlled hypertension Continue Hyzaar 50-12.5 mg daily Cardiovascular risks associated with hypertension discussed Diet and nutrition discussed      Relevant Orders   CBC with Differential/Platelet   Comprehensive metabolic panel   Hemoglobin A1c     Endocrine   Acquired hypothyroidism   Clinically euthyroid Continue Synthroid 112 mcg daily TSH done today      Relevant Orders   TSH     Other   Dyslipidemia   Diet and nutrition discussed Continue atorvastatin 10 mg daily      Relevant Orders   Lipid panel   Generalized anxiety disorder   Clinically stable Lexapro helping Continue Lexapro 10 mg daily      Patient Instructions  Mantenimiento de la salud en las mujeres Health Maintenance, Female Adoptar un estilo de vida saludable y recibir atencin preventiva son importantes para promover la salud y Counsellor. Consulte al mdico sobre: El esquema adecuado para hacerse pruebas y exmenes peridicos. Cosas que puede hacer por su cuenta para prevenir enfermedades y [place] sano. Qu debo saber sobre la dieta, el peso y el ejercicio? Consuma una dieta  saludable  Consuma una dieta que incluya muchas verduras, frutas, productos lcteos con bajo contenido de [place] y Associate Professor. No consuma muchos alimentos ricos en grasas slidas, azcares agregados o sodio. Mantenga un peso saludable El ndice de masa muscular [facility]) se [place] para identificar problemas de [place]. Proporciona una estimacin de la grasa corporal basndose en el peso y la altura. Su mdico puede ayudarle a Engineer, site IMC y a Personnel officer o Pharmacologist un peso saludable. Haga ejercicio con regularidad Haga ejercicio con regularidad. Esta es una de las prcticas ms importantes que puede hacer por su salud. La [employer] de los adultos deben seguir estas pautas: Education officer, environmental, al menos, 150 minutos de actividad fsica por semana. El ejercicio debe aumentar la frecuencia cardaca y Media planner transpirar (ejercicio de intensidad moderada). Hacer ejercicios de fortalecimiento por lo [facility] por semana. Agregue esto a su plan de ejercicio de intensidad moderada. Pase menos tiempo sentada. Incluso la actividad fsica ligera puede ser beneficiosa. Controle sus niveles de colesterol y lpidos en la sangre Comience a realizarse anlisis de lpidos y Oncologist en la sangre a los 20 aos y luego reptalos cada 5 aos. Hgase controlar los niveles de colesterol con mayor frecuencia si: Sus niveles de lpidos y colesterol son altos. Es mayor de 40 aos. Presenta un alto riesgo de padecer enfermedades cardacas. Qu debo saber sobre las pruebas de deteccin del cncer? Segn su historia clnica y sus antecedentes familiares, es posible que deba realizarse pruebas de deteccin del cncer en diferentes edades. Esto puede incluir pruebas de deteccin de lo siguiente: Cncer de mama. Cncer de cuello uterino. Cncer colorrectal. Cncer de piel. Cncer de pulmn. Qu debo saber sobre la enfermedad cardaca, la diabetes y la hipertensin arterial? Presin arterial y enfermedad cardaca La  hipertensin arterial causa enfermedades cardacas y [place] el riesgo de accidente cerebrovascular. Es ms probable que esto se manifieste en las personas que tienen lecturas de presin arterial alta o tienen sobrepeso. Hgase controlar la presin arterial: Cada 3 a  5 aos si tiene entre 18 y 14 aos. Todos los aos si es mayor de 40 aos. Diabetes Realcese exmenes de deteccin de la diabetes con regularidad. Este anlisis revisa el nivel de azcar en la sangre en [place]. Hgase las pruebas de deteccin: Cada tres aos despus de los 40 aos de edad si tiene un peso normal y un bajo riesgo de padecer diabetes. Con ms frecuencia y a partir de [place] edad inferior si tiene sobrepeso o un alto riesgo de padecer diabetes. Qu debo saber sobre la prevencin de infecciones? Hepatitis B Si tiene un riesgo ms alto de contraer hepatitis B, debe someterse a un examen de deteccin de este virus. Hable con el mdico para averiguar si tiene riesgo de contraer la infeccin por hepatitis B. Hepatitis C Se recomienda el anlisis a: [employer] 1945 y 1965. Todas las personas que tengan un riesgo de haber contrado hepatitis C. Enfermedades de transmisin sexual (ETS) Hgase las pruebas de Airline pilot de ITS, incluidas la gonorrea y la clamidia, si: Es sexualmente activa y es menor de [address]. Es mayor de [address], y Public affairs consultant informa que corre riesgo de tener este tipo de infecciones. La actividad sexual ha cambiado desde que le hicieron la ltima prueba de deteccin y tiene un riesgo mayor de Warehouse manager clamidia o Copy. Pregntele al mdico si usted tiene riesgo. Pregntele al mdico si usted tiene un alto riesgo de Primary school teacher VIH. El mdico tambin puede recomendarle un medicamento recetado para ayudar a evitar la infeccin por el VIH. Si elige tomar medicamentos para prevenir el VIH, primero debe [employer] de deteccin del VIH. Luego debe hacerse anlisis cada 3 meses mientras est  tomando los medicamentos. Embarazo Si est por dejar de Armed forces training and education officer (fase premenopusica) y usted puede quedar [place], busque asesoramiento antes de [place]. Tome de 400 a 800 microgramos (mcg) de cido [employer] si [place]. Pida mtodos de control de la natalidad (anticonceptivos) si desea evitar un embarazo no deseado. Osteoporosis y [place] La osteoporosis es una enfermedad en la que los huesos pierden los minerales y la fuerza por el avance de la edad. El resultado pueden ser fracturas en los [place]. Si tiene 65 aos o ms, o si est en riesgo de sufrir osteoporosis y fracturas, pregunte a su mdico si debe: Hacerse pruebas de deteccin de prdida sea. Tomar un suplemento de calcio o de vitamina D para reducir el riesgo de fracturas. Recibir terapia de reemplazo hormonal (TRH) para tratar los sntomas de la menopausia. Siga estas indicaciones en su casa: Consumo de alcohol No beba alcohol si: Su mdico le indica no hacerlo. Est embarazada, puede estar embarazada o est tratando de [place]. Si bebe alcohol: Limite la cantidad que bebe a lo siguiente: De 0 a 1 bebida por da. Sepa cunta cantidad de alcohol hay en las bebidas que toma. En los [address], una medida equivale a una botella de cerveza de 12 oz (355 ml), un vaso de vino de 5 oz (148 ml) o un vaso de una bebida alcohlica de alta graduacin de 1 oz (44 ml). Estilo de vida No consuma ningn producto que contenga nicotina o tabaco. Estos productos incluyen cigarrillos, tabaco para Theatre manager y aparatos de vapeo, como los Administrator, Civil Service. Si necesita ayuda para dejar de consumir estos productos, consulte al mdico. No consuma drogas. No comparta agujas. Solicite ayuda a su mdico si necesita apoyo o informacin para abandonar las drogas. Indicaciones generales Realcese  los estudios de rutina de la salud, dentales y de Wellsite geologist. Mantngase al da con las  vacunas. Infrmele a su mdico si: Se siente deprimida con frecuencia. Alguna vez ha sido vctima de [place] o no se siente seguro en su casa. Resumen Adoptar un estilo de vida saludable y recibir atencin preventiva son importantes para promover la salud y Counsellor. Siga las instrucciones del mdico acerca de una dieta saludable, el ejercicio y la realizacin de pruebas o exmenes para Hotel manager. Siga las instrucciones del mdico con respecto al control del colesterol y la presin arterial. Esta informacin no tiene Theme park manager el consejo del mdico. Asegrese de hacerle al mdico cualquier pregunta que tenga. Document Revised: 04/23/2021 Document Reviewed: 04/23/2021 Elsevier Patient Education  2024 Elsevier Inc.     Edwina Barth, MD [facility]

## 2024-02-16 NOTE — Assessment & Plan Note (Signed)
Diet and nutrition discussed.  Continue atorvastatin 10 mg daily. 

## 2024-02-16 NOTE — Assessment & Plan Note (Signed)
 BP Readings from Last 3 Encounters:  02/16/24 122/88  08/17/23 110/70  05/17/23 130/84  Well-controlled hypertension Continue Hyzaar 50-12.5 mg daily Cardiovascular risks associated with hypertension discussed Diet and nutrition discussed

## 2024-02-16 NOTE — Assessment & Plan Note (Signed)
Clinically euthyroid.  Continue Synthroid 112 mcg daily.  TSH done today.

## 2024-02-16 NOTE — Assessment & Plan Note (Signed)
 Clinically stable Lexapro helping Continue Lexapro 10 mg daily

## 2024-02-18 ENCOUNTER — Encounter: Payer: Self-pay | Admitting: Emergency Medicine

## 2024-02-18 ENCOUNTER — Other Ambulatory Visit: Payer: Self-pay | Admitting: Emergency Medicine

## 2024-02-18 DIAGNOSIS — F411 Generalized anxiety disorder: Secondary | ICD-10-CM

## 2024-03-08 ENCOUNTER — Ambulatory Visit: Payer: Self-pay

## 2024-04-25 ENCOUNTER — Encounter: Payer: Self-pay | Admitting: Emergency Medicine

## 2024-04-25 ENCOUNTER — Other Ambulatory Visit: Payer: Self-pay | Admitting: Radiology

## 2024-04-25 DIAGNOSIS — Z1211 Encounter for screening for malignant neoplasm of colon: Secondary | ICD-10-CM

## 2024-04-25 NOTE — Telephone Encounter (Signed)
 Place referral for colonoscopy please.  Thanks

## 2024-04-26 ENCOUNTER — Ambulatory Visit: Payer: Self-pay | Admitting: *Deleted

## 2024-04-26 ENCOUNTER — Ambulatory Visit
Admission: RE | Admit: 2024-04-26 | Discharge: 2024-04-26 | Disposition: A | Discharge status: Home / Self Care | Payer: Self-pay | Source: Ambulatory Visit | Attending: Obstetrics and Gynecology | Admitting: Obstetrics and Gynecology

## 2024-04-26 VITALS — BP 145/88 | Wt 132.8 lb

## 2024-04-26 DIAGNOSIS — Z1231 Encounter for screening mammogram for malignant neoplasm of breast: Secondary | ICD-10-CM

## 2024-04-26 DIAGNOSIS — Z1239 Encounter for other screening for malignant neoplasm of breast: Secondary | ICD-10-CM

## 2024-04-26 DIAGNOSIS — Z1211 Encounter for screening for malignant neoplasm of colon: Secondary | ICD-10-CM

## 2024-04-26 NOTE — Progress Notes (Signed)
 [name] is a 48 y.o. female who presents to [facility] clinic today with no complaints.    Pap Smear: Pap smear not completed today. Last Pap smear was 03/02/2022 at [facility] clinic and was normal with negative HPV. Per patient has no history of an abnormal Pap smear. Last Pap smear result is available in Epic.   Physical exam: Breasts Breasts symmetrical. No skin abnormalities bilateral breasts. No nipple retraction bilateral breasts. No nipple discharge bilateral breasts. No lymphadenopathy. No lumps palpated bilateral breasts. No complaints of pain or tenderness on exam.      MS DIGITAL SCREENING TOMO BILATERAL Result Date: 03/03/2022 CLINICAL DATA:  Screening. EXAM: DIGITAL SCREENING BILATERAL MAMMOGRAM WITH TOMOSYNTHESIS AND CAD TECHNIQUE: Bilateral screening digital craniocaudal and mediolateral oblique mammograms were obtained. Bilateral screening digital breast tomosynthesis was performed. The images were evaluated with computer-aided detection. COMPARISON:  Previous exam(s). ACR Breast Density Category c: The breast tissue is heterogeneously dense, which may obscure small masses. FINDINGS: There are no findings suspicious for malignancy. IMPRESSION: No mammographic evidence of malignancy. A result letter of this screening mammogram will be mailed directly to the patient. RECOMMENDATION: Screening mammogram in one year. (Code:SM-B-01Y) BI-RADS CATEGORY  1: Negative. Electronically Signed   By: Sundra Engel M.D.   On: 03/03/2022 13:06   MS DIGITAL SCREENING TOMO BILATERAL Result Date: 12/26/2020 CLINICAL DATA:  Screening. EXAM: DIGITAL SCREENING BILATERAL MAMMOGRAM WITH TOMO AND CAD COMPARISON:  Previous exam(s). ACR Breast Density Category d: The breast tissue is extremely dense, which lowers the sensitivity of mammography FINDINGS: There are no findings suspicious for malignancy. The images were evaluated with computer-aided detection. IMPRESSION: No mammographic evidence of malignancy. A  result letter of this screening mammogram will be mailed directly to the patient. RECOMMENDATION: Screening mammogram in one year. (Code:SM-B-01Y) BI-RADS CATEGORY  1: Negative. Electronically Signed   By: Serena  Chacko M.D.   On: 12/26/2020 15:43   MS DIGITAL SCREENING TOMO BILATERAL Result Date: 10/17/2019 CLINICAL DATA:  Screening. EXAM: DIGITAL SCREENING BILATERAL MAMMOGRAM WITH TOMO AND CAD COMPARISON:  Previous exam(s). ACR Breast Density Category d: The breast tissue is extremely dense, which lowers the sensitivity of mammography FINDINGS: There are no findings suspicious for malignancy. Images were processed with CAD. IMPRESSION: No mammographic evidence of malignancy. A result letter of this screening mammogram will be mailed directly to the patient. RECOMMENDATION: Screening mammogram in one year. (Code:SM-B-01Y) BI-RADS CATEGORY  1: Negative. Electronically Signed   By: Serena  Chacko M.D.   On: 10/17/2019 11:21    Pelvic/Bimanual Pap is not indicated today per BCCCP guidelines.   Smoking History: Patient has never smoked.   Patient Navigation: Patient education provided. Access to services provided for patient through BCCCP program.   Colorectal Cancer Screening: Per patient has never had colonoscopy completed. FIT Test given to patient to complete. No complaints today.     Breast and Cervical Cancer Risk Assessment: Patient does not have family history of breast cancer, known genetic mutations, or radiation treatment to the chest before age 39. Patient does not have history of cervical dysplasia, immunocompromised, or DES exposure in-utero.  Risk Scores as of Encounter on 04/26/2024     Teresa Michael           5-year 0.76%   Lifetime 7.47%   This patient is Hispana/Latina but has no documented birth country, so the [place] model used data from [place] patients to calculate their risk score. Document a birth country in the Demographics activity for a more accurate  score.         Last  calculated by Rudy Costain, CMA on 04/26/2024 at 11:19 AM        A: BCCCP exam without pap smear No complaints.  P: Referred patient to the [facility] for a screening mammogram on mobile unit. Appointment scheduled Thursday, Apr 26, 2024 at 1140.   Stefan Edge, RN 04/26/2024 11:40 AM

## 2024-04-26 NOTE — Patient Instructions (Signed)
 Explained breast self awareness with Teresa Michael. Patient did not need a Pap smear today due to last Pap smear and HPV typing was 03/02/2022. Let her know BCCCP will cover Pap smears and HPV typing every 5 years unless has a history of abnormal Pap smears. Referred patient to the [facility] of [facility] for a screening mammogram on mobile unit. Appointment scheduled Thursday, Apr 26, 2024 at 1140. Patient aware of appointment and will be there. Let patient know the Breast Center will follow up with her within the next couple weeks with results of mammogram by letter or phone. Teresa Michael verbalized understanding.  Nycere Presley, Dela Favor, RN 11:40 AM

## 2024-05-04 LAB — FECAL OCCULT BLOOD, IMMUNOCHEMICAL: Fecal Occult Bld: NEGATIVE

## 2024-05-18 ENCOUNTER — Other Ambulatory Visit: Payer: Self-pay | Admitting: Emergency Medicine

## 2024-05-18 DIAGNOSIS — F411 Generalized anxiety disorder: Secondary | ICD-10-CM

## 2024-05-18 DIAGNOSIS — I10 Essential (primary) hypertension: Secondary | ICD-10-CM

## 2024-06-22 ENCOUNTER — Other Ambulatory Visit: Payer: Self-pay | Admitting: Emergency Medicine

## 2024-06-22 DIAGNOSIS — E785 Hyperlipidemia, unspecified: Secondary | ICD-10-CM

## 2024-07-17 ENCOUNTER — Ambulatory Visit: Payer: Self-pay | Admitting: Physician Assistant

## 2024-07-17 ENCOUNTER — Encounter: Payer: Self-pay | Admitting: Physician Assistant

## 2024-07-17 VITALS — BP 131/94

## 2024-07-17 DIAGNOSIS — L821 Other seborrheic keratosis: Secondary | ICD-10-CM

## 2024-07-17 DIAGNOSIS — L918 Other hypertrophic disorders of the skin: Secondary | ICD-10-CM

## 2024-07-17 DIAGNOSIS — D229 Melanocytic nevi, unspecified: Secondary | ICD-10-CM

## 2024-07-17 NOTE — Patient Instructions (Signed)

## 2024-07-17 NOTE — Progress Notes (Signed)
   New Patient Visit   Subjective  Teresa Michael is a 48 y.o. female NEW PATIENT who presents for the following: Moles of face, neck, back that have come in the last 10 years. There are no symptoms.    The following portions of the chart were reviewed this encounter and updated as appropriate: medications, allergies, medical history  Review of Systems:  No other skin or systemic complaints except as noted in HPI or Assessment and Plan.  Objective  Well appearing patient in no apparent distress; mood and affect are within normal limits.   A focused examination was performed of the following areas: Scalp, face, neck, hands/arms, chest, abdomen, back   Relevant exam findings are noted in the Assessment and Plan.    Assessment & Plan   SEBORRHEIC KERATOSIS - Stuck-on, waxy, tan-brown papules of face, neck and back  - Benign-appearing - Discussed benign etiology and prognosis. - Observe - Call for any changes - Discussed LN2 treatment and risk of hypopigmentation. Advised patient the treatment would not likely be covered by insurance.  Acrochordons (Skin Tags) - Fleshy, skin-colored pedunculated papules of neck - Benign appearing.  - Observe. - If desired, they can be removed with an in office procedure that is not covered by insurance. - Please call the clinic if you notice any new or changing lesions.   BENIGN NEVI  - Reassurance provided      MULTIPLE BENIGN NEVI   SEBORRHEIC KERATOSIS   ACROCHORDON    Return if symptoms worsen or fail to improve.  I, Roseline Hutchinson, CMA, am acting as scribe for Caylah Plouff K, PA-C .   Documentation: I have reviewed the above documentation for accuracy and completeness, and I agree with the above.  Sonita Michiels K, PA-C

## 2024-08-16 ENCOUNTER — Other Ambulatory Visit: Payer: Self-pay | Admitting: Emergency Medicine

## 2024-08-16 DIAGNOSIS — I10 Essential (primary) hypertension: Secondary | ICD-10-CM

## 2024-08-16 DIAGNOSIS — F411 Generalized anxiety disorder: Secondary | ICD-10-CM

## 2024-08-20 ENCOUNTER — Ambulatory Visit: Payer: Self-pay | Admitting: Emergency Medicine

## 2024-09-25 ENCOUNTER — Encounter: Payer: Self-pay | Admitting: Emergency Medicine

## 2024-09-25 ENCOUNTER — Ambulatory Visit: Payer: Self-pay | Admitting: Emergency Medicine

## 2024-09-25 VITALS — BP 120/70 | HR 74 | Temp 97.5°F | Ht 60.0 in | Wt 134.0 lb

## 2024-09-25 DIAGNOSIS — E039 Hypothyroidism, unspecified: Secondary | ICD-10-CM

## 2024-09-25 DIAGNOSIS — F411 Generalized anxiety disorder: Secondary | ICD-10-CM

## 2024-09-25 DIAGNOSIS — R42 Dizziness and giddiness: Secondary | ICD-10-CM | POA: Insufficient documentation

## 2024-09-25 DIAGNOSIS — I10 Essential (primary) hypertension: Secondary | ICD-10-CM

## 2024-09-25 DIAGNOSIS — E785 Hyperlipidemia, unspecified: Secondary | ICD-10-CM

## 2024-09-25 LAB — CBC WITH DIFFERENTIAL/PLATELET
Basophils Absolute: 0 K/uL (ref 0.0–0.1)
Basophils Relative: 0.3 % (ref 0.0–3.0)
Eosinophils Absolute: 0 K/uL (ref 0.0–0.7)
Eosinophils Relative: 0 % (ref 0.0–5.0)
HCT: 47 % — ABNORMAL HIGH (ref 36.0–46.0)
Hemoglobin: 15.9 g/dL — ABNORMAL HIGH (ref 12.0–15.0)
Lymphocytes Relative: 20.7 % (ref 12.0–46.0)
Lymphs Abs: 1.2 K/uL (ref 0.7–4.0)
MCHC: 33.8 g/dL (ref 30.0–36.0)
MCV: 82 fl (ref 78.0–100.0)
Monocytes Absolute: 0.4 K/uL (ref 0.1–1.0)
Monocytes Relative: 7 % (ref 3.0–12.0)
Neutro Abs: 4.1 K/uL (ref 1.4–7.7)
Neutrophils Relative %: 72 % (ref 43.0–77.0)
Platelets: 302 K/uL (ref 150.0–400.0)
RBC: 5.74 Mil/uL — ABNORMAL HIGH (ref 3.87–5.11)
RDW: 14.5 % (ref 11.5–15.5)
WBC: 5.8 K/uL (ref 4.0–10.5)

## 2024-09-25 LAB — COMPREHENSIVE METABOLIC PANEL WITH GFR
ALT: 50 U/L — ABNORMAL HIGH (ref 0–35)
AST: 30 U/L (ref 0–37)
Albumin: 5.1 g/dL (ref 3.5–5.2)
Alkaline Phosphatase: 80 U/L (ref 39–117)
BUN: 11 mg/dL (ref 6–23)
CO2: 32 meq/L (ref 19–32)
Calcium: 10 mg/dL (ref 8.4–10.5)
Chloride: 99 meq/L (ref 96–112)
Creatinine, Ser: 0.82 mg/dL (ref 0.40–1.20)
GFR: 83.99 mL/min (ref >60.00)
Glucose, Bld: 124 mg/dL — ABNORMAL HIGH (ref 70–99)
Potassium: 3.4 meq/L — ABNORMAL LOW (ref 3.5–5.1)
Sodium: 139 meq/L (ref 135–145)
Total Bilirubin: 0.5 mg/dL (ref 0.2–1.2)
Total Protein: 8.2 g/dL (ref 6.0–8.3)

## 2024-09-25 LAB — VITAMIN D 25 HYDROXY (VIT D DEFICIENCY, FRACTURES): VITD: 38.16 ng/mL (ref 30.00–100.00)

## 2024-09-25 LAB — VITAMIN B12: Vitamin B-12: 359 pg/mL (ref 211–911)

## 2024-09-25 LAB — TSH: TSH: 2.88 u[IU]/mL (ref 0.35–5.50)

## 2024-09-25 LAB — FERRITIN: Ferritin: 9.8 ng/mL — ABNORMAL LOW (ref 10.0–291.0)

## 2024-09-25 NOTE — Assessment & Plan Note (Signed)
 BP Readings from Last 3 Encounters:  09/25/24 (!) 142/92  07/17/24 (!) 131/94  04/26/24 (!) 145/88  Elevated blood pressure reading in the office but normal at home Continue Hyzaar 50-12.5 mg daily Cardiovascular risks associated with hypertension discussed Diet and nutrition discussed

## 2024-09-25 NOTE — Patient Instructions (Signed)
Mareos Dizziness Los mareos son un problema muy frecuente. Causan sensacin de inestabilidad o de desvanecimiento. Puede sentir que se va a desmayar. Los [employer] pueden provocarle una lesin si se tropieza o se cae. Es ms frecuente sentirse mareado si es un adulto mayor. Hay muchas cosas que pueden hacer que se sienta mareado. Esto incluye lo siguiente: Medicamentos. Deshidratacin. Esto ocurre cuando no hay suficiente agua en el cuerpo. Enfermedad. Siga estas instrucciones en su casa: Comida y bebida  Beber suficiente lquido para mantener el pis (orina) de color amarillo plido. Esto evita la deshidratacin. Trate de beber ms lquidos transparentes, como agua. No beba alcohol. Trate de limitar la cantidad de cafena que consume. Trate de limitar la cantidad de sal, tambin llamada sodio, que consume. Actividad Trate de no hacer movimientos rpidos. Prese lentamente despus de estar sentado en una silla. Sostngase hasta que se sienta bien. Por la maana, sintese primero a un lado de la cama. Cuando se sienta bien, sostngase de algo y pngase lentamente de pie. Haga esto hasta que se sienta seguro en cuanto al equilibrio. Mueva las piernas con frecuencia si debe estar de pie en un lugar durante mucho tiempo. Mientras est de pie, contraiga y relaje los msculos de las piernas. No conduzca ni use mquinas si se siente mareado. Evite agacharse si se siente mareado. En su casa, coloque los objetos en algn lugar que pueda alcanzarlos sin agacharse. Estilo de vida No fume ni use vapeadores o productos que tengan nicotina o tabaco. Si necesita ayuda para dejar de fumar, hable con el mdico. Intente bajar el nivel de estrs. Puede hacerlo usando mtodos como el yoga o la meditacin. Hable con el mdico si necesita ayuda. Instrucciones generales Controle sus mareos para ver si hay cambios. Use los medicamentos solamente como se lo haya indicado el mdico. Hable con el mdico si cree que la  causa de sus mareos es algn medicamento que est tomando. Dgale a un amigo o a un familiar si se siente mareado. Si detectan algn cambio en su comportamiento, pdales que llamen a su mdico. Comunquese con un mdico si: Los mareos no desaparecen o tiene sntomas nuevos. Su mareo empeora. Siente que va a vomitar. Tiene problemas para escuchar. Tiene fiebre. Tiene dolor o rigidez en el cuello. Se cae o se lastima. Solicite ayuda de inmediato si: Vomita cada vez que come o bebe. Tiene heces acuosas y no puede comer ni beber. Tiene problemas para hablar, caminar, tragar o [employer], las manos o las piernas. Se siente muy dbil. Est sangrando. No piensa con claridad o tiene problemas para armar oraciones. Un amigo o un familiar pueden notar que esto ocurre. Le cambia la visin o tiene un dolor de cabeza muy intenso. Estos sntomas pueden Customer service manager. Llame al 911 de inmediato. No espere a ver si los sntomas desaparecen. No conduzca por sus propios medios [employer]. Esta informacin no tiene Theme park manager el consejo del mdico. Asegrese de hacerle al mdico cualquier pregunta que tenga. Document Revised: 02/24/2023 Document Reviewed: 02/24/2023 Elsevier Patient Education  2024 [employer].

## 2024-09-25 NOTE — Assessment & Plan Note (Signed)
 Clinically stable Lexapro helping Continue Lexapro 10 mg daily

## 2024-09-25 NOTE — Assessment & Plan Note (Signed)
 Clinically stable.  No red flag signs or symptoms. Benign physical examination Blood pressure slightly elevated Recommend blood work today Stress contributing to symptoms.  Mental health management discussed Maybe viral illness Advised to continue monitoring symptoms and contact the office if clinical picture changes or feels worse

## 2024-09-25 NOTE — Assessment & Plan Note (Signed)
Diet and nutrition discussed.  Continue atorvastatin 10 mg daily. 

## 2024-09-25 NOTE — Assessment & Plan Note (Signed)
Clinically euthyroid.  Continue Synthroid 112 mcg daily.  TSH done today.

## 2024-09-25 NOTE — Progress Notes (Signed)
 Teresa Michael 48 y.o.   Chief Complaint  Patient presents with   Dizziness    Started last week. States that it comes and goes. Notices it more when she is working. States a long time ago this has happened before.     HISTORY OF PRESENT ILLNESS: This is a 48 y.o. female complaining of intermittent dizziness for the last week. Episodes last a couple hours.  No other associated symptoms.  No syncope Stress at work and with finances. No significant lifestyle or nutritional changes Non-smoker.  Social drinker. No changes in medications No other complaint or medical concerns today. Just entered menopause earlier this year.  Was started by her gynecologist on hormone replacement therapy. Denies recent flulike symptoms.  Dizziness Pertinent negatives include no abdominal pain, chest pain, chills, congestion, coughing, fever, headaches, nausea, rash, sore throat, vomiting or weakness.     Prior to Admission medications   Medication Sig Start Date End Date Taking? Authorizing Provider  acetaminophen  (TYLENOL ) 500 MG tablet Take 1,000 mg by mouth every 6 (six) hours as needed for moderate pain or mild pain.   Yes [provider]  atorvastatin  (LIPITOR) 10 MG tablet Take 1 tablet by mouth once daily 06/22/24  Yes Semaj Kham Jose, MD  azelastine  (OPTIVAR ) 0.05 % ophthalmic solution INSTILL 1 DROP INTO AFFECTED EYE INTO EACH EYE ONCE DAILY 01/04/24  Yes Emmabelle Fear Jose, MD  escitalopram  (LEXAPRO ) 10 MG tablet Take 1 tablet by mouth once daily 08/16/24  Yes Helio Lack, Emil Schanz, MD  fluticasone  (FLONASE ) 50 MCG/ACT nasal spray Place 2 sprays into both nostrils daily. 12/04/18  Yes Tish Alm DEL, MD  levothyroxine (SYNTHROID) 112 MCG tablet Take 112 mcg by mouth daily before breakfast.   Yes [provider]  losartan -hydrochlorothiazide (HYZAAR) 50-12.5 MG tablet Take 1 tablet by mouth once daily 08/16/24  Yes Malinda Mayden, Emil Schanz, MD  melatonin 5 MG TABS Take 5  mg by mouth at bedtime as needed (Sleep).   Yes [provider]  montelukast (SINGULAIR) 10 MG tablet Take 10 mg by mouth at bedtime. 01/05/24  Yes [provider]  Multiple Vitamin (MULTIVITAMIN) tablet Take 1 tablet by mouth daily.   Yes [provider]  progesterone (PROMETRIUM) 100 MG capsule Take 100-200 mg by mouth at bedtime. 01/11/24  Yes [provider]    No Known Allergies  Patient Active Problem List   Diagnosis Date Noted   Change in multiple pigmented skin lesions 08/17/2023   Generalized anxiety disorder 05/17/2023   History of cholecystectomy 08/16/2022   Tiredness 08/16/2022   Perimenopausal 12/03/2021   Vitamin D  deficiency 12/17/2020   Absolute anemia 12/17/2020   Situational anxiety 12/17/2020   Dyslipidemia 12/17/2020   Essential hypertension 05/09/2019   Acquired hypothyroidism 10/04/2013    Past Medical History:  Diagnosis Date   Anemia    low iron in 2020   Anxiety    COVID    2020 and 2023 - both times mild symptoms   Family history of adverse reaction to anesthesia    Hyperlipidemia    Hypertension    Hypothyroidism    PONV (postoperative nausea and vomiting)    has had dizziness also   Thyroid  disease     Past Surgical History:  Procedure Laterality Date   CESAREAN SECTION     2 previous   CHOLECYSTECTOMY N/A 03/22/2022   Procedure: LAPAROSCOPIC CHOLECYSTECTOMY;  Surgeon: Rubin Calamity, MD;  Location: [facility] OR;  Service: General;  Laterality: N/A;   WISDOM  TOOTH EXTRACTION      Social History   Socioeconomic History   Marital status: Married    Spouse name: Not on file   Number of children: 2   Years of education: 12   Highest education level: High school graduate  Occupational History   Occupation: Conservator, museum/gallery  Tobacco Use   Smoking status: Never   Smokeless tobacco: Never  Vaping Use   Vaping status: Never Used  Substance and Sexual Activity   Alcohol use: Yes    Comment: special occasions    Drug use: No   Sexual activity: Yes    Partners: Male    Birth control/protection: Condom  Other Topics Concern   Not on file  Social History Narrative   Pt. Exercises regularly   Social Drivers of Health   Financial Resource Strain: Not on file  Food Insecurity: No Food Insecurity (04/26/2024)   Hunger Vital Sign    Worried About Running Out of Food in the Last Year: Never true    Ran Out of Food in the Last Year: Never true  Transportation Needs: No Transportation Needs (04/26/2024)   PRAPARE - Administrator, Civil Service (Medical): No    Lack of Transportation (Non-Medical): No  Physical Activity: Not on file  Stress: Not on file  Social Connections: Unknown (04/13/2022)   Received from [facility]   Social Network    Social Network: Not on file  Intimate Partner Violence: Unknown (03/05/2022)   Received from [facility]   HITS    Physically Hurt: Not on file    Insult or Talk Down To: Not on file    Threaten Physical Harm: Not on file    Scream or Curse: Not on file    Family History  Problem Relation Age of Onset   Depression Mother    Hypertension Mother    Hypertension Father    Hyperlipidemia Father    Hypertension Maternal Grandfather    Depression Brother    Breast cancer Neg Hx      Review of Systems  Constitutional: Negative.  Negative for chills and fever.  HENT: Negative.  Negative for congestion and sore throat.   Eyes: Negative.   Respiratory: Negative.  Negative for cough and shortness of breath.   Cardiovascular: Negative.  Negative for chest pain and palpitations.  Gastrointestinal:  Negative for abdominal pain, diarrhea, nausea and vomiting.  Genitourinary: Negative.  Negative for dysuria and hematuria.  Skin: Negative.  Negative for rash.  Neurological:  Positive for dizziness. Negative for speech change, focal weakness, weakness and headaches.  All other systems reviewed and are negative.   Today's Vitals   09/25/24 1427   BP: (!) 142/92  Pulse: 74  Temp: (!) 97.5 F (36.4 C)  TempSrc: Oral  SpO2: 97%  Weight: 134 lb (60.8 kg)  Height: 5' (1.524 m)   Body mass index is 26.17 kg/m.   Physical Exam Vitals reviewed.  Constitutional:      Appearance: Normal appearance.  HENT:     Head: Normocephalic.     Mouth/Throat:     Mouth: Mucous membranes are moist.     Pharynx: Oropharynx is clear.  Eyes:     Extraocular Movements: Extraocular movements intact.     Pupils: Pupils are equal, round, and reactive to light.  Cardiovascular:     Rate and Rhythm: Normal rate and regular rhythm.     Pulses: Normal pulses.     Heart sounds: Normal heart sounds.  Pulmonary:     Effort: Pulmonary effort is normal.     Breath sounds: Normal breath sounds.  Musculoskeletal:     Cervical back: No tenderness.  Lymphadenopathy:     Cervical: No cervical adenopathy.  Skin:    General: Skin is warm and dry.     Capillary Refill: Capillary refill takes less than 2 seconds.  Neurological:     General: No focal deficit present.     Mental Status: She is alert and oriented to person, place, and time.  Psychiatric:        Mood and Affect: Mood normal.        Behavior: Behavior normal.      ASSESSMENT & PLAN: Problem List Items Addressed This Visit       Cardiovascular and Mediastinum   Essential hypertension   BP Readings from Last 3 Encounters:  09/25/24 (!) 142/92  07/17/24 (!) 131/94  04/26/24 (!) 145/88  Elevated blood pressure reading in the office but normal at home Continue Hyzaar 50-12.5 mg daily Cardiovascular risks associated with hypertension discussed Diet and nutrition discussed        Endocrine   Acquired hypothyroidism   Clinically euthyroid Continue Synthroid 112 mcg daily TSH done today      Relevant Orders   TSH (Completed)     Other   Dyslipidemia   Diet and nutrition discussed Continue atorvastatin  10 mg daily      Generalized anxiety disorder   Clinically  stable Lexapro  helping Continue Lexapro  10 mg daily      Dizziness - Primary   Clinically stable.  No red flag signs or symptoms. Benign physical examination Blood pressure slightly elevated Recommend blood work today Stress contributing to symptoms.  Mental health management discussed Maybe viral illness Advised to continue monitoring symptoms and contact the office if clinical picture changes or feels worse      Relevant Orders   TSH (Completed)   Comprehensive metabolic panel with GFR (Completed)   CBC with Differential/Platelet (Completed)   Vitamin B12 (Completed)   VITAMIN D  25 Hydroxy (Vit-D Deficiency, Fractures) (Completed)   Ferritin (Completed)      Patient Instructions  Mareos Dizziness Los mareos son un problema muy frecuente. Causan sensacin de inestabilidad o de desvanecimiento. Puede sentir que se va a desmayar. Los [employer] pueden provocarle una lesin si se tropieza o se cae. Es ms frecuente sentirse mareado si es un adulto mayor. Hay muchas cosas que pueden hacer que se sienta mareado. Esto incluye lo siguiente: Medicamentos. Deshidratacin. Esto ocurre cuando no hay suficiente agua en el cuerpo. Enfermedad. Siga estas instrucciones en su casa: Comida y bebida  Beber suficiente lquido para mantener el pis (orina) de color amarillo plido. Esto evita la deshidratacin. Trate de beber ms lquidos transparentes, como agua. No beba alcohol. Trate de limitar la cantidad de cafena que consume. Trate de limitar la cantidad de sal, tambin llamada sodio, que consume. Actividad Trate de no hacer movimientos rpidos. Prese lentamente despus de estar sentado en una silla. Sostngase hasta que se sienta bien. Por la maana, sintese primero a un lado de la cama. Cuando se sienta bien, sostngase de algo y pngase lentamente de pie. Haga esto hasta que se sienta seguro en cuanto al equilibrio. Mueva las piernas con frecuencia si debe estar de pie en un lugar  durante mucho tiempo. Mientras est de pie, contraiga y relaje los msculos de las piernas. No conduzca ni use mquinas si se siente mareado. Evite agacharse si se  siente mareado. En su casa, coloque los objetos en algn lugar que pueda alcanzarlos sin agacharse. Estilo de vida No fume ni use vapeadores o productos que tengan nicotina o tabaco. Si necesita ayuda para dejar de fumar, hable con el mdico. Intente bajar el nivel de estrs. Puede hacerlo usando mtodos como el yoga o la meditacin. Hable con el mdico si necesita ayuda. Instrucciones generales Controle sus mareos para ver si hay cambios. Use los medicamentos solamente como se lo haya indicado el mdico. Hable con el mdico si cree que la causa de sus mareos es algn medicamento que est tomando. Dgale a un amigo o a un familiar si se siente mareado. Si detectan algn cambio en su comportamiento, pdales que llamen a su mdico. Comunquese con un mdico si: Los mareos no desaparecen o tiene sntomas nuevos. Su mareo empeora. Siente que va a vomitar. Tiene problemas para escuchar. Tiene fiebre. Tiene dolor o rigidez en el cuello. Se cae o se lastima. Solicite ayuda de inmediato si: Vomita cada vez que come o bebe. Tiene heces acuosas y no puede comer ni beber. Tiene problemas para hablar, caminar, tragar o boeing, las manos o las piernas. Se siente muy dbil. Est sangrando. No piensa con claridad o tiene problemas para armar oraciones. Un amigo o un familiar pueden notar que esto ocurre. Le cambia la visin o tiene un dolor de cabeza muy intenso. Estos sntomas pueden customer service manager. Llame al 911 de inmediato. No espere a ver si los sntomas desaparecen. No conduzca por sus propios medios [employer]. Esta informacin no tiene theme park manager el consejo del mdico. Asegrese de hacerle al mdico cualquier pregunta que tenga. Document Revised: 02/24/2023 Document Reviewed: 02/24/2023 Elsevier  Patient Education  2024 Elsevier Inc.     Emil Schaumann, MD [facility]

## 2024-09-26 ENCOUNTER — Ambulatory Visit: Payer: Self-pay | Admitting: Emergency Medicine

## 2024-09-27 ENCOUNTER — Other Ambulatory Visit: Payer: Self-pay

## 2024-09-27 DIAGNOSIS — I10 Essential (primary) hypertension: Secondary | ICD-10-CM

## 2024-09-27 MED ORDER — LOSARTAN POTASSIUM-HCTZ 50-12.5 MG PO TABS: 1.0000 | ORAL_TABLET | Freq: Every day | ORAL | 0 refills | Status: AC

## 2024-11-16 NOTE — Progress Notes (Signed)
" °  Subjective Patient ID: Teresa Michael is a 48 y.o. female.  Chief Complaint  Patient presents with   Cough   Flu Symptoms    Kid was diagnosed with flu. Wanting flu testing because she is now feeling flu symptoms such as nasal congestion, body aches, and cough. No fever and OTC medication with some relief.     The following information was reviewed by members of the visit team:  Tobacco  Allergies  Meds  Problems  Med Hx  Surg Hx  Fam Hx  Soc  Hx     Pt is a 48 yo here for URI symptoms, started having fever today; states taking OTC med with some improvement; denies SOB, N/V/D, stable on exam  Cough Associated symptoms include chills and a fever.  Flu Symptoms Associated symptoms include chills, congestion, coughing, fatigue and a fever.    Review of Systems  Constitutional:  Positive for appetite change, chills, fatigue and fever.  HENT:  Positive for congestion, sinus pressure, sinus pain and sneezing.   Respiratory:  Positive for cough.   All other systems reviewed and are negative.   Objective Physical Exam Vitals and nursing note reviewed.  Constitutional:      General: She is not in acute distress.    Appearance: She is normal weight.  HENT:     Head: Normocephalic.     Right Ear: Tympanic membrane normal.     Left Ear: Tympanic membrane normal.     Nose: Nose normal.     Mouth/Throat:     Mouth: Mucous membranes are moist.     Pharynx: Posterior oropharyngeal erythema present.  Eyes:     Extraocular Movements: Extraocular movements intact.     Pupils: Pupils are equal, round, and reactive to light.  Cardiovascular:     Rate and Rhythm: Normal rate and regular rhythm.     Heart sounds: Normal heart sounds.  Pulmonary:     Effort: Pulmonary effort is normal.     Breath sounds: Normal breath sounds.  Musculoskeletal:     Cervical back: Normal range of motion.  Skin:    General: Skin is warm.     Capillary Refill: Capillary refill takes less  than 2 seconds.  Neurological:     General: No focal deficit present.     Mental Status: She is alert and oriented to person, place, and time.  Psychiatric:        Mood and Affect: Mood normal.        Behavior: Behavior normal.     Assessment/Plan Diagnoses and all orders for this visit:  Influenza A -     oseltamivir  (TAMIFLU ) 75 mg capsule; Take 1 capsule (75 mg total) by mouth 2 (two) times a day for 5 days.  Fever, unspecified fever cause  Acute cough    DDX: Otitis media, influenza, COVID,  streptococcal tonsillitis, bronchitis, sinusitis, viral syndrome, bacterial infection, Pneumonia,   MDM: Pt is a 48 yo here for URI symptoms influenza A +; Discussed symptomatic treatment as well as follow up, advised to see PCP in 3-4 days for reassessment,  advised on red flags warranting ER evaluation, pt verbalized understanding of all instructions, agreeable to plan of care, stable at departure.  Urgent Care Disposition:  Home Care    Electronically signed: Rexene Charlies Pouch, NP 11/16/2024  9:04 PM   "

## 2024-12-04 ENCOUNTER — Telehealth: Payer: Self-pay

## 2024-12-04 NOTE — Telephone Encounter (Signed)
 Spoke with patient via [employer] [phone]. Patient called asking if she could get a vaginal ultrasound paid for by the [facility] program. Explained to the patient that [facility] only covers mammograms and pap smears. Offered to refer patient to the [facility] for FU. Mailed patient financial assistance application. Patient voiced understanding.

## 2024-12-27 ENCOUNTER — Ambulatory Visit (INDEPENDENT_AMBULATORY_CARE_PROVIDER_SITE_OTHER): Payer: Self-pay | Admitting: Emergency Medicine

## 2024-12-27 ENCOUNTER — Encounter: Payer: Self-pay | Admitting: Emergency Medicine

## 2024-12-27 VITALS — BP 124/80 | HR 89 | Temp 97.5°F | Ht 60.0 in | Wt 136.0 lb

## 2024-12-27 DIAGNOSIS — N951 Menopausal and female climacteric states: Secondary | ICD-10-CM

## 2024-12-27 DIAGNOSIS — I10 Essential (primary) hypertension: Secondary | ICD-10-CM

## 2024-12-27 DIAGNOSIS — E785 Hyperlipidemia, unspecified: Secondary | ICD-10-CM

## 2024-12-27 DIAGNOSIS — E559 Vitamin D deficiency, unspecified: Secondary | ICD-10-CM

## 2024-12-27 DIAGNOSIS — E039 Hypothyroidism, unspecified: Secondary | ICD-10-CM

## 2024-12-27 DIAGNOSIS — F411 Generalized anxiety disorder: Secondary | ICD-10-CM

## 2024-12-27 NOTE — Assessment & Plan Note (Signed)
 Was able to follow-up with her gynecologist Was started on hormonal replacement therapy but she has decided to stop taking it and go through process the natural way

## 2024-12-27 NOTE — Progress Notes (Signed)
 Teresa Michael 49 y.o.   Chief Complaint  Patient presents with   Follow-up    HISTORY OF PRESENT ILLNESS: This is a 49 y.o. female here for follow-up of chronic medical conditions Last office visit last October she was complaining of dizziness.  Much improved.  Symptoms are gone. No other complaints or medical concerns today. BP Readings from Last 3 Encounters:  12/27/24 124/80  09/25/24 120/70  07/17/24 (!) 131/94     HPI   Prior to Admission medications  Medication Sig Start Date End Date Taking? Authorizing Provider  acetaminophen  (TYLENOL ) 500 MG tablet Take 1,000 mg by mouth every 6 (six) hours as needed for moderate pain or mild pain.   Yes [provider]  atorvastatin  (LIPITOR) 10 MG tablet Take 1 tablet by mouth once daily 06/22/24  Yes Neysa Arts Jose, MD  azelastine  (OPTIVAR ) 0.05 % ophthalmic solution INSTILL 1 DROP INTO AFFECTED EYE INTO EACH EYE ONCE DAILY 01/04/24  Yes Delance Weide Jose, MD  escitalopram  (LEXAPRO ) 10 MG tablet Take 1 tablet by mouth once daily 08/16/24  Yes Lanier Millon Jose, MD  fluticasone  (FLONASE ) 50 MCG/ACT nasal spray Place 2 sprays into both nostrils daily. 12/04/18  Yes Tish Alm DEL, MD  levothyroxine (SYNTHROID) 112 MCG tablet Take 112 mcg by mouth daily before breakfast.   Yes [provider]  losartan -hydrochlorothiazide (HYZAAR) 50-12.5 MG tablet Take 1 tablet by mouth daily. 09/27/24  Yes Ridwan Bondy, Emil Schanz, MD  melatonin 5 MG TABS Take 5 mg by mouth at bedtime as needed (Sleep).   Yes [provider]  montelukast (SINGULAIR) 10 MG tablet Take 10 mg by mouth at bedtime. 01/05/24  Yes [provider]  Multiple Vitamin (MULTIVITAMIN) tablet Take 1 tablet by mouth daily.   Yes [provider]  progesterone (PROMETRIUM) 100 MG capsule Take 100-200 mg by mouth at bedtime. 01/11/24  Yes [provider]    Allergies[1]  Patient Active Problem List   Diagnosis Date  Noted   Change in multiple pigmented skin lesions 08/17/2023   Generalized anxiety disorder 05/17/2023   History of cholecystectomy 08/16/2022   Perimenopausal 12/03/2021   Vitamin D  deficiency 12/17/2020   Absolute anemia 12/17/2020   Situational anxiety 12/17/2020   Dyslipidemia 12/17/2020   Essential hypertension 05/09/2019   Acquired hypothyroidism 10/04/2013    Past Medical History:  Diagnosis Date   Anemia    low iron in 2020   Anxiety    COVID    2020 and 2023 - both times mild symptoms   Family history of adverse reaction to anesthesia    Hyperlipidemia    Hypertension    Hypothyroidism    PONV (postoperative nausea and vomiting)    has had dizziness also   Thyroid  disease     Past Surgical History:  Procedure Laterality Date   CESAREAN SECTION     2 previous   CHOLECYSTECTOMY N/A 03/22/2022   Procedure: LAPAROSCOPIC CHOLECYSTECTOMY;  Surgeon: Rubin Calamity, MD;  Location: [facility] OR;  Service: General;  Laterality: N/A;   WISDOM TOOTH EXTRACTION      Social History   Socioeconomic History   Marital status: Married    Spouse name: Not on file   Number of children: 2   Years of education: 12   Highest education level: High school graduate  Occupational History   Occupation: Conservator, museum/gallery  Tobacco Use   Smoking status: Never   Smokeless tobacco: Never  Vaping Use   Vaping status: Never Used  Substance and Sexual Activity   Alcohol use: Yes    Comment: special occasions   Drug use: No   Sexual activity: Yes    Partners: Male    Birth control/protection: Condom  Other Topics Concern   Not on file  Social History Narrative   Pt. Exercises regularly   Social Drivers of Health   Tobacco Use: Low Risk (12/27/2024)   Patient History    Smoking Tobacco Use: Never    Smokeless Tobacco Use: Never    Passive Exposure: Not on file  Financial Resource Strain: Not on file  Food Insecurity: No Food Insecurity (04/26/2024)   Hunger Vital Sign    Worried About  Running Out of Food in the Last Year: Never true    Ran Out of Food in the Last Year: Never true  Transportation Needs: No Transportation Needs (04/26/2024)   PRAPARE - Administrator, Civil Service (Medical): No    Lack of Transportation (Non-Medical): No  Physical Activity: Not on file  Stress: Not on file  Social Connections: Unknown (04/13/2022)   Received from [facility]   Social Network    Social Network: Not on file  Intimate Partner Violence: Unknown (03/05/2022)   Received from [facility]   HITS    Physically Hurt: Not on file    Insult or Talk Down To: Not on file    Threaten Physical Harm: Not on file    Scream or Curse: Not on file  Depression (PHQ2-9): Low Risk (12/27/2024)   Depression (PHQ2-9)    PHQ-2 Score: 0  Alcohol Screen: Not on file  Housing: Low Risk (12/05/2023)   Received from [facility]   Epic    What is your living situation today?: I have a steady place to live    Think about the place you live. Do you have problems with any of the following? Choose all that apply:: None/None on this list  Utilities: Low Risk (12/05/2023)   Received from [facility]   Utilities    In the past 12 months has the electric, gas, oil, or water company threatened to shut off services in your home? : No  Health Literacy: Not on file    Family History  Problem Relation Age of Onset   Depression Mother    Hypertension Mother    Hypertension Father    Hyperlipidemia Father    Hypertension Maternal Grandfather    Depression Brother    Breast cancer Neg Hx      Review of Systems  Constitutional: Negative.  Negative for chills and fever.  HENT: Negative.  Negative for congestion and sore throat.   Respiratory: Negative.  Negative for cough and shortness of breath.   Cardiovascular: Negative.  Negative for chest pain and palpitations.  Gastrointestinal:  Negative for abdominal pain, diarrhea, nausea and vomiting.  Genitourinary: Negative.  Negative for  dysuria and hematuria.  Skin: Negative.  Negative for rash.  Neurological: Negative.  Negative for dizziness and headaches.  All other systems reviewed and are negative.   Vitals:   12/27/24 1318  BP: 124/80  Pulse: 89  Temp: (!) 97.5 F (36.4 C)  SpO2: 98%    Physical Exam Vitals reviewed.  Constitutional:      Appearance: Normal appearance.  HENT:     Head: Normocephalic.     Mouth/Throat:     Mouth: Mucous membranes are moist.     Pharynx: Oropharynx is clear.  Eyes:     Extraocular Movements:  Extraocular movements intact.     Pupils: Pupils are equal, round, and reactive to light.  Cardiovascular:     Rate and Rhythm: Normal rate and regular rhythm.     Pulses: Normal pulses.     Heart sounds: Normal heart sounds.  Pulmonary:     Effort: Pulmonary effort is normal.     Breath sounds: Normal breath sounds.  Musculoskeletal:     Cervical back: No tenderness.  Lymphadenopathy:     Cervical: No cervical adenopathy.  Skin:    General: Skin is warm and dry.     Capillary Refill: Capillary refill takes less than 2 seconds.  Neurological:     General: No focal deficit present.     Mental Status: She is alert and oriented to person, place, and time.  Psychiatric:        Mood and Affect: Mood normal.        Behavior: Behavior normal.      ASSESSMENT & PLAN: Problem List Items Addressed This Visit       Cardiovascular and Mediastinum   Essential hypertension - Primary   BP Readings from Last 3 Encounters:  12/27/24 124/80  09/25/24 120/70  07/17/24 (!) 131/94  Well-controlled hypertension Continue Hyzaar 50-12.5 mg daily Cardiovascular risks associated with hypertension discussed Diet and nutrition discussed         Endocrine   Acquired hypothyroidism   Lab Results  Component Value Date   TSH 2.88 09/25/2024  Clinically euthyroid Continue Synthroid 112 mcg daily          Other   Vitamin D  deficiency   Much improved with normal vitamin D   level last October Last vitamin D  Lab Results  Component Value Date   VD25OH 38.16 09/25/2024         Dyslipidemia   Diet and nutrition discussed Continue atorvastatin  10 mg daily      Perimenopausal   Was able to follow-up with her gynecologist Was started on hormonal replacement therapy but she has decided to stop taking it and go through process the natural way      Generalized anxiety disorder   Known triggers Mental health management discussed Clinically stable Lexapro  helping Continue Lexapro  10 mg daily      Patient Instructions  Mantenimiento de la salud en las mujeres Health Maintenance, Female Adoptar un estilo de vida saludable y recibir atencin preventiva son importantes para promover la salud y counsellor. Consulte al mdico sobre: El esquema adecuado para hacerse pruebas y exmenes peridicos. Cosas que puede hacer por su cuenta para prevenir enfermedades y [place] sano. Qu debo saber sobre la dieta, el peso y el ejercicio? Consuma una dieta saludable  Consuma una dieta que incluya muchas verduras, frutas, productos lcteos con bajo contenido de grasa y protenas magras. No consuma muchos alimentos ricos en grasas slidas, azcares agregados o sodio. Mantenga un peso saludable El ndice de masa muscular [facility]) se utiliza para identificar problemas de [place]. Proporciona una estimacin de la grasa corporal basndose en el peso y la altura. Su mdico puede ayudarle a determinar su [facility]MC y a personnel officer o pharmacologist un peso saludable. Haga ejercicio con regularidad Haga ejercicio con regularidad. Esta es una de las prcticas ms importantes que puede hacer por su salud. La [employer] de los adultos deben seguir estas pautas: Education Officer, Environmental, al menos, 150 minutos de actividad fsica por semana. El ejercicio debe aumentar la frecuencia cardaca y media planner transpirar (ejercicio de intensidad moderada). Hacer ejercicios de fortalecimiento por lo menos  dos veces por semana.  Agregue esto a su plan de ejercicio de intensidad moderada. Pase menos tiempo sentada. Incluso la actividad fsica ligera puede ser beneficiosa. Controle sus niveles de colesterol y lpidos en la sangre Comience a realizarse anlisis de lpidos y oncologist en la sangre a los 20 aos y luego reptalos cada 5 aos. Hgase controlar los niveles de colesterol con mayor frecuencia si: Sus niveles de lpidos y colesterol son altos. Es mayor de 40 aos. Presenta un alto riesgo de padecer enfermedades cardacas. Qu debo saber sobre las pruebas de deteccin del cncer? Segn su historia clnica y sus antecedentes familiares, es posible que deba realizarse pruebas de deteccin del cncer en diferentes edades. Esto puede incluir pruebas de deteccin de lo siguiente: Cncer de mama. Cncer de cuello uterino. Cncer colorrectal. Cncer de piel. Cncer de pulmn. Qu debo saber sobre la enfermedad cardaca, la diabetes y la hipertensin arterial? Presin arterial y enfermedad cardaca La hipertensin arterial causa enfermedades cardacas y aumenta el riesgo de accidente cerebrovascular. Es ms probable que esto se manifieste en las personas que tienen lecturas de presin arterial alta o tienen sobrepeso. Hgase controlar la presin arterial: Cada 3 a 5 aos si tiene entre 18 y 3 aos. Todos los aos si es mayor de 40 aos. Diabetes Realcese exmenes de deteccin de la diabetes con regularidad. Este anlisis revisa el nivel de azcar en la sangre en [place]. Hgase las pruebas de deteccin: Cada tres aos despus de los 40 aos de edad si tiene un peso normal y un bajo riesgo de padecer diabetes. Con ms frecuencia y a partir de [place] edad inferior si tiene sobrepeso o un alto riesgo de padecer diabetes. Qu debo saber sobre la prevencin de infecciones? Hepatitis B Si tiene un riesgo ms alto de contraer hepatitis B, debe someterse a un examen de deteccin de este virus. Hable con el mdico para  averiguar si tiene riesgo de contraer la infeccin por hepatitis B. Hepatitis C Se recomienda el anlisis a: [employer] 1945 y 1965. Todas las personas que tengan un riesgo de haber contrado hepatitis C. Enfermedades de transmisin sexual (ETS) Hgase las pruebas de airline pilot de ITS, incluidas la gonorrea y la clamidia, si: Es sexualmente activa y es menor de [address]. Es mayor de [address], y public affairs consultant informa que corre riesgo de tener este tipo de infecciones. La actividad sexual ha cambiado desde que le hicieron la ltima prueba de deteccin y tiene un riesgo mayor de tener clamidia o copy. Pregntele al mdico si usted tiene riesgo. Pregntele al mdico si usted tiene un alto riesgo de primary school teacher VIH. El mdico tambin puede recomendarle un medicamento recetado para ayudar a evitar la infeccin por el VIH. Si elige tomar medicamentos para prevenir el VIH, primero debe oneok de deteccin del VIH. Luego debe hacerse anlisis cada 3 meses mientras est tomando los medicamentos. Embarazo Si est por dejar de menstruar (fase premenopusica) y usted puede quedar embarazada, busque asesoramiento antes de quedar embarazada. Tome de 400 a 800 microgramos (mcg) de cido flico todos los das si queda embarazada. Pida mtodos de control de la natalidad (anticonceptivos) si desea evitar un embarazo no deseado. Osteoporosis y menopausia La osteoporosis es una enfermedad en la que los huesos pierden los minerales y la fuerza por el avance de la edad. El resultado pueden ser fracturas en los [place]. Si tiene 65 aos o ms, o si est en riesgo de sufrir osteoporosis y fracturas,  pregunte a su mdico si debe: Hacerse pruebas de deteccin de prdida sea. Tomar un suplemento de calcio o de vitamina D para reducir el riesgo de fracturas. Recibir terapia de reemplazo hormonal (TRH) para tratar los sntomas de la menopausia. Siga estas indicaciones en su casa: Consumo de  alcohol No beba alcohol si: Su mdico le indica no hacerlo. Est embarazada, puede estar embarazada o est tratando de quedar embarazada. Si bebe alcohol: Limite la cantidad que bebe a lo siguiente: De 0 a 1 bebida por da. Sepa cunta cantidad de alcohol hay en las bebidas que toma. En los [address], una medida equivale a una botella de cerveza de 12 oz (355 ml), un vaso de vino de 5 oz (148 ml) o un vaso de una bebida alcohlica de alta graduacin de 1 oz (44 ml). Estilo de vida No consuma ningn producto que contenga nicotina o tabaco. Estos productos incluyen cigarrillos, tabaco para theatre manager y aparatos de vapeo, como los cigarrillos electrnicos. Si necesita ayuda para dejar de consumir estos productos, consulte al mdico. No consuma drogas. No comparta agujas. Solicite ayuda a su mdico si necesita apoyo o informacin para abandonar las drogas. Indicaciones generales Realcese los estudios de rutina de [address], dentales y de wellsite geologist. Mantngase al da con las vacunas. Infrmele a su mdico si: Se siente deprimida con frecuencia. Alguna vez ha sido vctima de maltrato o no se siente seguro en su casa. Resumen Adoptar un estilo de vida saludable y recibir atencin preventiva son importantes para promover la salud y counsellor. Siga las instrucciones del mdico acerca de una dieta saludable, el ejercicio y la realizacin de pruebas o exmenes para hotel manager. Siga las instrucciones del mdico con respecto al control del colesterol y la presin arterial. Esta informacin no tiene theme park manager el consejo del mdico. Asegrese de hacerle al mdico cualquier pregunta que tenga. Document Revised: 04/23/2021 Document Reviewed: 04/23/2021 Elsevier Patient Education  2024 Elsevier Inc.    Emil Schaumann, MD [facility]    [1] No Known Allergies

## 2024-12-27 NOTE — Assessment & Plan Note (Signed)
Diet and nutrition discussed.  Continue atorvastatin 10 mg daily. 

## 2024-12-27 NOTE — Assessment & Plan Note (Signed)
 BP Readings from Last 3 Encounters:  12/27/24 124/80  09/25/24 120/70  07/17/24 (!) 131/94  Well-controlled hypertension Continue Hyzaar 50-12.5 mg daily Cardiovascular risks associated with hypertension discussed Diet and nutrition discussed

## 2024-12-27 NOTE — Assessment & Plan Note (Signed)
 Much improved with normal vitamin D  level last October Last vitamin D  Lab Results  Component Value Date   VD25OH 38.16 09/25/2024

## 2024-12-27 NOTE — Patient Instructions (Signed)
 Mantenimiento de Radiographer, therapeutic en las mujeres Health Maintenance, Female Adoptar un estilo de vida saludable y recibir atencin preventiva son importantes para promover la salud y Counsellor. Consulte al mdico sobre: El esquema adecuado para hacerse pruebas y exmenes peridicos. Cosas que puede hacer por su cuenta para prevenir enfermedades y [place] sano. Qu debo saber sobre la dieta, el peso y el ejercicio? Consuma una dieta saludable  Consuma una dieta que incluya muchas verduras, frutas, productos lcteos con bajo contenido de [place] y Associate Professor. No consuma muchos alimentos ricos en grasas slidas, azcares agregados o sodio. Mantenga un peso saludable El ndice de masa muscular [facility]) se [place] para identificar problemas de [place]. Proporciona una estimacin de la grasa corporal basndose en el peso y la altura. Su mdico puede ayudarle a Engineer, site IMC y a Personnel officer o Pharmacologist un peso saludable. Haga ejercicio con regularidad Haga ejercicio con regularidad. Esta es una de las prcticas ms importantes que puede hacer por su salud. La [employer] de los adultos deben seguir estas pautas: Education officer, environmental, al menos, 150 minutos de actividad fsica por semana. El ejercicio debe aumentar la frecuencia cardaca y Media planner transpirar (ejercicio de intensidad moderada). Hacer ejercicios de fortalecimiento por lo [facility] por semana. Agregue esto a su plan de ejercicio de intensidad moderada. Pase menos tiempo sentada. Incluso la actividad fsica ligera puede ser beneficiosa. Controle sus niveles de colesterol y lpidos en la sangre Comience a realizarse anlisis de lpidos y Oncologist en la sangre a los 20 aos y luego reptalos cada 5 aos. Hgase controlar los niveles de colesterol con mayor frecuencia si: Sus niveles de lpidos y colesterol son altos. Es mayor de 40 aos. Presenta un alto riesgo de padecer enfermedades cardacas. Qu debo saber sobre las pruebas de deteccin del  cncer? Segn su historia clnica y sus antecedentes familiares, es posible que deba realizarse pruebas de deteccin del cncer en diferentes edades. Esto puede incluir pruebas de deteccin de lo siguiente: Cncer de mama. Cncer de cuello uterino. Cncer colorrectal. Cncer de piel. Cncer de pulmn. Qu debo saber sobre la enfermedad cardaca, la diabetes y la hipertensin arterial? Presin arterial y enfermedad cardaca La hipertensin arterial causa enfermedades cardacas y [place] el riesgo de accidente cerebrovascular. Es ms probable que esto se manifieste en las personas que tienen lecturas de presin arterial alta o tienen sobrepeso. Hgase controlar la presin arterial: Cada 3 a 5 aos si tiene entre 18 y 50 aos. Todos los aos si es mayor de 40 aos. Diabetes Realcese exmenes de deteccin de la diabetes con regularidad. Este anlisis revisa el nivel de azcar en la sangre en [place]. Hgase las pruebas de deteccin: Cada tres aos despus de los 40 aos de edad si tiene un peso normal y un bajo riesgo de padecer diabetes. Con ms frecuencia y a partir de [place] edad inferior si tiene sobrepeso o un alto riesgo de padecer diabetes. Qu debo saber sobre la prevencin de infecciones? Hepatitis B Si tiene un riesgo ms alto de contraer hepatitis B, debe someterse a un examen de deteccin de este virus. Hable con el mdico para averiguar si tiene riesgo de contraer la infeccin por hepatitis B. Hepatitis C Se recomienda el anlisis a: [employer] 1945 y 1965. Todas las personas que tengan un riesgo de haber contrado hepatitis C. Enfermedades de transmisin sexual (ETS) Hgase las pruebas de Airline pilot de ITS, incluidas la gonorrea y la clamidia, si: Es sexualmente activa y es Adult nurse de 24  aos. Es mayor de 24 aos, y el mdico le informa que corre riesgo de tener este tipo de infecciones. La actividad sexual ha cambiado desde que le hicieron la ltima prueba de  deteccin y tiene un riesgo mayor de Warehouse manager clamidia o Copy. Pregntele al mdico si usted tiene riesgo. Pregntele al mdico si usted tiene un alto riesgo de Primary school teacher VIH. El mdico tambin puede recomendarle un medicamento recetado para ayudar a evitar la infeccin por el VIH. Si elige tomar medicamentos para prevenir el VIH, primero debe [employer] de deteccin del VIH. Luego debe hacerse anlisis cada 3 meses mientras est tomando los medicamentos. Embarazo Si est por dejar de Armed forces training and education officer (fase premenopusica) y usted puede quedar [place], busque asesoramiento antes de [place]. Tome de 400 a 800 microgramos (mcg) de cido [employer] si [place]. Pida mtodos de control de la natalidad (anticonceptivos) si desea evitar un embarazo no deseado. Osteoporosis y [place] La osteoporosis es una enfermedad en la que los huesos pierden los minerales y la fuerza por el avance de la edad. El resultado pueden ser fracturas en los [place]. Si tiene 65 aos o ms, o si est en riesgo de sufrir osteoporosis y fracturas, pregunte a su mdico si debe: Hacerse pruebas de deteccin de prdida sea. Tomar un suplemento de calcio o de vitamina D para reducir el riesgo de fracturas. Recibir terapia de reemplazo hormonal (TRH) para tratar los sntomas de la menopausia. Siga estas indicaciones en su casa: Consumo de alcohol No beba alcohol si: Su mdico le indica no hacerlo. Est embarazada, puede estar embarazada o est tratando de [place]. Si bebe alcohol: Limite la cantidad que bebe a lo siguiente: De 0 a 1 bebida por da. Sepa cunta cantidad de alcohol hay en las bebidas que toma. En los [address], una medida equivale a una botella de cerveza de 12 oz (355 ml), un vaso de vino de 5 oz (148 ml) o un vaso de una bebida alcohlica de alta graduacin de 1 oz (44 ml). Estilo de vida No consuma ningn producto que contenga nicotina o tabaco. Estos  productos incluyen cigarrillos, tabaco para Theatre manager y aparatos de vapeo, como los Administrator, Civil Service. Si necesita ayuda para dejar de consumir estos productos, consulte al mdico. No consuma drogas. No comparta agujas. Solicite ayuda a su mdico si necesita apoyo o informacin para abandonar las drogas. Indicaciones generales Realcese los estudios de rutina de [address], dentales y de Wellsite geologist. Mantngase al da con las vacunas. Infrmele a su mdico si: Se siente deprimida con frecuencia. Alguna vez ha sido vctima de [place] o no se siente seguro en su casa. Resumen Adoptar un estilo de vida saludable y recibir atencin preventiva son importantes para promover la salud y Counsellor. Siga las instrucciones del mdico acerca de una dieta saludable, el ejercicio y la realizacin de pruebas o exmenes para Hotel manager. Siga las instrucciones del mdico con respecto al control del colesterol y la presin arterial. Esta informacin no tiene Theme park manager el consejo del mdico. Asegrese de hacerle al mdico cualquier pregunta que tenga. Document Revised: 04/23/2021 Document Reviewed: 04/23/2021 Elsevier Patient Education  2024 [employer].

## 2024-12-27 NOTE — Assessment & Plan Note (Signed)
 Known triggers Mental health management discussed Clinically stable Lexapro  helping Continue Lexapro  10 mg daily

## 2024-12-27 NOTE — Assessment & Plan Note (Signed)
 Lab Results  Component Value Date   TSH 2.88 09/25/2024  Clinically euthyroid Continue Synthroid 112 mcg daily
